# Patient Record
Sex: Female | Born: 1945 | Race: White | Hispanic: No | Marital: Married | Smoking: Never smoker
Health system: Southern US, Community
[De-identification: ages and names within clinical notes are randomized; demographics above are authoritative.]

## PROBLEM LIST (undated history)

## (undated) DIAGNOSIS — R011 Cardiac murmur, unspecified: Secondary | ICD-10-CM

## (undated) DIAGNOSIS — E785 Hyperlipidemia, unspecified: Secondary | ICD-10-CM

## (undated) DIAGNOSIS — I517 Cardiomegaly: Secondary | ICD-10-CM

## (undated) DIAGNOSIS — M8580 Other specified disorders of bone density and structure, unspecified site: Secondary | ICD-10-CM

## (undated) DIAGNOSIS — I10 Essential (primary) hypertension: Secondary | ICD-10-CM

## (undated) DIAGNOSIS — M810 Age-related osteoporosis without current pathological fracture: Secondary | ICD-10-CM

## (undated) HISTORY — PX: OTHER SURGICAL HISTORY: SHX169

## (undated) HISTORY — DX: Other specified disorders of bone density and structure, unspecified site: M85.80

## (undated) HISTORY — DX: Hyperlipidemia, unspecified: E78.5

## (undated) HISTORY — DX: Essential (primary) hypertension: I10

## (undated) HISTORY — DX: Cardiac murmur, unspecified: R01.1

## (undated) HISTORY — DX: Cardiomegaly: I51.7

## (undated) HISTORY — PX: AUGMENTATION MAMMAPLASTY: SUR837

## (undated) HISTORY — PX: COLONOSCOPY: SHX174

## (undated) HISTORY — DX: Age-related osteoporosis without current pathological fracture: M81.0

## (undated) HISTORY — PX: TUBAL LIGATION: SHX77

---

## 2008-04-28 ENCOUNTER — Encounter: Payer: Self-pay | Admitting: Internal Medicine

## 2008-04-28 LAB — CONVERTED CEMR LAB
Albumin: 4.2 g/dL
CO2: 24 meq/L
Chloride: 103 meq/L
Cholesterol: 203 mg/dL
Creatinine, Ser: 0.84 mg/dL
HDL: 54 mg/dL
Lymphocytes, automated: 47 %
MCV: 90 fL
Monocytes Relative: 8 %
RDW: 12.3 %
Sodium: 141 meq/L
Triglyceride fasting, serum: 75 mg/dL

## 2008-10-15 ENCOUNTER — Encounter: Admission: RE | Admit: 2008-10-15 | Discharge: 2008-10-15 | Payer: Self-pay | Admitting: Family Medicine

## 2008-11-10 ENCOUNTER — Encounter: Payer: Self-pay | Admitting: Internal Medicine

## 2008-11-10 LAB — CONVERTED CEMR LAB
Calcium: 9.9 mg/dL
Chloride: 105 meq/L
Eosinophils Relative: 0 %
Glucose, Bld: 101 mg/dL
Neutrophils Relative %: 57 %
Platelets: 280 10*3/uL
Potassium: 5.8 meq/L
RBC: 4.23 M/uL
RDW: 12.9 %
Sodium: 142 meq/L

## 2008-11-12 ENCOUNTER — Encounter: Payer: Self-pay | Admitting: Internal Medicine

## 2008-11-12 LAB — CONVERTED CEMR LAB
AST: 23 units/L
Albumin: 4.6 g/dL
Alkaline Phosphatase: 111 units/L
BUN: 21 mg/dL
Calcium: 9.7 mg/dL
Cholesterol: 196 mg/dL
Creatinine, Ser: 0.81 mg/dL
Glucose, Bld: 88 mg/dL
Sodium: 140 meq/L
Total Protein: 7.3 g/dL

## 2009-09-27 ENCOUNTER — Encounter: Payer: Self-pay | Admitting: Internal Medicine

## 2009-09-27 LAB — CONVERTED CEMR LAB

## 2010-07-16 ENCOUNTER — Emergency Department (HOSPITAL_COMMUNITY)
Admission: EM | Admit: 2010-07-16 | Discharge: 2010-07-16 | Payer: Self-pay | Source: Home / Self Care | Admitting: Emergency Medicine

## 2010-11-08 ENCOUNTER — Other Ambulatory Visit: Payer: Medicare Other

## 2010-11-08 ENCOUNTER — Other Ambulatory Visit: Payer: Self-pay | Admitting: Internal Medicine

## 2010-11-08 ENCOUNTER — Encounter: Payer: Self-pay | Admitting: Internal Medicine

## 2010-11-08 ENCOUNTER — Ambulatory Visit (INDEPENDENT_AMBULATORY_CARE_PROVIDER_SITE_OTHER): Payer: Medicare Other | Admitting: Internal Medicine

## 2010-11-08 ENCOUNTER — Ambulatory Visit
Admission: RE | Admit: 2010-11-08 | Discharge: 2010-11-08 | Disposition: A | Discharge status: Home / Self Care | Payer: Medicare Other | Source: Ambulatory Visit | Attending: Internal Medicine | Admitting: Internal Medicine

## 2010-11-08 DIAGNOSIS — Z0000 Encounter for general adult medical examination without abnormal findings: Secondary | ICD-10-CM

## 2010-11-08 DIAGNOSIS — Z23 Encounter for immunization: Secondary | ICD-10-CM

## 2010-11-08 DIAGNOSIS — R030 Elevated blood-pressure reading, without diagnosis of hypertension: Secondary | ICD-10-CM | POA: Insufficient documentation

## 2010-11-08 DIAGNOSIS — Z9189 Other specified personal risk factors, not elsewhere classified: Secondary | ICD-10-CM | POA: Insufficient documentation

## 2010-11-08 DIAGNOSIS — Z8679 Personal history of other diseases of the circulatory system: Secondary | ICD-10-CM | POA: Insufficient documentation

## 2010-11-08 DIAGNOSIS — E785 Hyperlipidemia, unspecified: Secondary | ICD-10-CM

## 2010-11-08 LAB — LDL CHOLESTEROL, DIRECT: Direct LDL: 163.1 mg/dL

## 2010-11-08 LAB — GLUCOSE, RANDOM: Glucose, Bld: 91 mg/dL (ref 70–99)

## 2010-11-08 LAB — LIPID PANEL
Total CHOL/HDL Ratio: 4
VLDL: 15.2 mg/dL (ref 0.0–40.0)

## 2010-11-09 ENCOUNTER — Other Ambulatory Visit: Payer: Medicare Other

## 2010-11-09 ENCOUNTER — Other Ambulatory Visit: Payer: Self-pay | Admitting: Internal Medicine

## 2010-11-09 ENCOUNTER — Encounter (INDEPENDENT_AMBULATORY_CARE_PROVIDER_SITE_OTHER): Payer: Self-pay | Admitting: *Deleted

## 2010-11-09 DIAGNOSIS — E785 Hyperlipidemia, unspecified: Secondary | ICD-10-CM | POA: Insufficient documentation

## 2010-11-09 DIAGNOSIS — M76899 Other specified enthesopathies of unspecified lower limb, excluding foot: Secondary | ICD-10-CM | POA: Insufficient documentation

## 2010-11-09 DIAGNOSIS — Z79899 Other long term (current) drug therapy: Secondary | ICD-10-CM

## 2010-11-09 LAB — HEPATIC FUNCTION PANEL: Total Bilirubin: 0.6 mg/dL (ref 0.3–1.2)

## 2010-11-16 ENCOUNTER — Encounter: Payer: Self-pay | Admitting: Internal Medicine

## 2010-11-17 ENCOUNTER — Encounter: Payer: Self-pay | Admitting: Internal Medicine

## 2010-11-17 NOTE — Assessment & Plan Note (Signed)
Summary: NEW/MEDICARE/#/CD   Vital Signs:  Patient profile:   65 year old female Height:      70 inches (177.80 cm) Weight:      194.0 pounds (88.18 kg) BMI:     27.94 O2 Sat:      96 % on Room air Temp:     98.6 degrees F (37.00 degrees C) oral Pulse rate:   65 / minute BP sitting:   130 / 92  (left arm) Cuff size:   large  Vitals Entered By: Orlan Leavens RMA (November 08, 2010 9:50 AM)  O2 Flow:  Room air CC: New patient Is Patient Diabetic? No Pain Assessment Patient in pain? no       Vision Screening:Left eye w/o correction: 20 / 25 Right Eye w/o correction: 20 / 30 Both eyes w/o correction:  20/ 25  Color vision testing: normal      Vision Entered By: Newt Lukes MD (November 08, 2010 2:15 PM) 40db HL: Left  Right  Audiometry Comment: hearing normal to whispered voice    Primary Care Provider:  Newt Lukes MD  CC:  New patient.  History of Present Illness: Patient is here for "welcome to medicare" evaluation. feels well today -    notes tendency towards high BP but not on med tx for same  also noted pain in right "hip" buttock -  intermitt symptoms - present >97mo pain worst with 1st few steps after sitting, then fades with continued walking 0 no groin or leg pain, no radiation or pain denies precipitating injury  Preventive Screening-Counseling & Management  Alcohol-Tobacco     Alcohol drinks/day: 3     Alcohol Counseling: to decrease amount and/or frequency of alcohol intake     Smoking Status: never     Tobacco Counseling: not indicated; no tobacco use  Caffeine-Diet-Exercise     Caffeine Counseling: not indicated; caffeine use is not excessive or problematic     Diet Counseling: to improve diet; diet is suboptimal     Does Patient Exercise: no     Exercise Counseling: to improve exercise regimen     Depression Counseling: not indicated; screening negative for depression  Safety-Violence-Falls     Seat Belt Counseling: not  indicated; patient wears seat belts     Helmet Counseling: not indicated; patient wears helmet when riding bicycle/motocycle     Firearm Counseling: not indicated; uses recommended firearm safety measures     Violence Counseling: not indicated; no violence risk noted     Fall Risk Counseling: not indicated; no significant falls noted  Clinical Review Panels:  Prevention   Last Mammogram:  No specific mammographic evidence of malignancy.   (09/27/2007)   Last Pap Smear:  Interpretation Result:Negative for intraepithelial Lesion or Malignancy.    (09/27/2009)  Immunizations   Last Tetanus Booster:  Tdap (11/08/2010)   Last Pneumovax:  Pneumovax (Medicare) (11/08/2010)   Current Medications (verified): 1)  None  Allergies (verified): 1)  ! 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 I have reviewed all other systems and they were negative.   Physical Exam  General:  overweight-appearing.  alert, well-developed, well-nourished, and cooperative to examination.    Head:  Normocephalic and atraumatic without obvious abnormalities. No apparent alopecia or balding. Eyes:  vision grossly intact; pupils equal, round and reactive to light.  conjunctiva and lids normal.    Ears:  normal pinnae bilaterally, without erythema, swelling, or tenderness to palpation. TMs clear, without effusion, or cerumen impaction. Hearing grossly normal bilaterally  Mouth:  teeth and gums in good repair;  mucous membranes moist, without lesions or ulcers. oropharynx clear without exudate, no erythema.  Neck:  supple, full ROM, no masses, no thyromegaly; no thyroid nodules or tenderness. no JVD or carotid bruits.   Lungs:  normal respiratory effort, no intercostal retractions or use of accessory muscles; normal breath sounds bilaterally - no crackles and no wheezes.    Heart:  normal rate, regular rhythm, no murmur, and no rub. BLE without edema. normal DP pulses and normal cap refill in all 4 extremities    Abdomen:  soft, non-tender, normal bowel sounds, no distention; no masses and no appreciable hepatomegaly or splenomegaly.   Genitalia:  defer gyn Msk:  r hip: full range of motion. Negative pain with logroll. No deep groin pain. nontender over greater trochanter.  no gross deformities Neurologic:  alert & oriented X3 and cranial nerves II-XII symetrically intact.  strength normal in all extremities, sensation intact to light touch, and gait normal. speech fluent without dysarthria or aphasia; follows commands with good comprehension.  Skin:  no rashes, vesicles, ulcers, or erythema. No nodules or irregularity to palpation.  Psych:  tearful when discussing mother (and death from panc ca 51yr ago) - otherwise, Oriented X3, memory intact for recent and remote, normally interactive, good eye contact, not anxious appearing, not depressed appearing, and not agitated.      Impression & Recommendations:  Problem # 1:  PREVENTIVE HEALTH CARE (ICD-V70.0) Patient is here for "welcome to medicare" evaluation. Risk factors for depression per USPSTF are reviewed and negative. Patient hearing function is screened today; ADLs are reviewed and addressed as needed; functional ability and  level of safety have been reviewed and are appropriate. ECG is performed and reviewed. Education, counseling, and referrals are performed based on age appropriate health issues. Patient has information regarding separate  preventitive health services covered by medicare part b  Orders: T-Bone Densitometry [phone]) T-Lumbar Vertebral Assessment (14782) Misc. Referral (Misc. Ref) TLB-Lipid Panel (80061-LIPID) TLB-Glucose, QUANT (82947-GLU) Welcome to Medicare, Physical [phone]) EKG w/ Interpretation (93000)  Problem # 2:  BURSITIS, RIGHT HIP (ICD-726.5) pain from bursitis, mild - refer to PT for eval and tx Orders: Physical Therapy Referral (PT)  Problem # 3:  HYPERTENSION (ICD-401.9) borderline - check lipids but no starting meds yet - rec diet, low Na and exercise - reck 3 mo  BP today: 130/92  Other Orders: Tdap => 66yrs IM (30865) Pneumococcal Vaccine (78469) Admin 1st Vaccine (62952) Admin of Any Addtl Vaccine (84132)  Patient Instructions: 1)  it was good to see you today. 2)  welcome to medicare checklist given to you today 3)  Tdap and Pneumovax given today 4)  test(s) ordered today - your results will be posted on the phone tree for review in 48-72 hours from the time of test completion; call [phone] and enter your 9 digit MRN (listed above on this page, just below your name); if any changes need to be  made or there are abnormal results, you will be contacted directly. 5)  we'll make referral for bone density and for mammogram screening. Our office will contact you regarding these appointments once made.  6)  also we'll make referral to physical therapy for your right hip/butt pain. Our office will contact you regarding this appointment once made.  7)  Please schedule a follow-up appointment in 3 months to recheck blood pressure, call sooner if problems.  8)  will send for records from [facility] and for colonoscopy report from [facility]   Orders Added: 1)  T-Bone Densitometry [77080] 2)  T-Lumbar Vertebral Assessment [77082] 3)  Tdap => 63yrs IM [90715] 4)  Pneumococcal Vaccine [90732] 5)  Admin 1st Vaccine [90471] 6)  Admin of Any Addtl Vaccine [90472] 7)  Misc. Referral [Misc.  Ref] 8)  TLB-Lipid Panel [80061-LIPID] 9)  TLB-Glucose, QUANT [82947-GLU] 10)  Welcome to Medicare, Physical [G0402] 11)  EKG w/ Interpretation [93000] 12)  Physical Therapy Referral [PT]   Immunizations Administered:  Tetanus Vaccine:    Vaccine Type: Tdap    Site: right deltoid    Mfr: GlaxoSmithKline    Dose: 0.5 ml    Route: IM    Given by: Orlan Leavens RMA    Exp. Date: 07/24/2011    Lot #: [id number]    VIS given: 11/08/10  Pneumonia Vaccine:    Vaccine Type: Pneumovax (Medicare)    Site: left deltoid    Mfr: Merck    Dose: 0.5 ml    Route: IM    Given by: Orlan Leavens RMA    Exp. Date: 02/18/2012    Lot #: [id number]    VIS given: 11/08/10   Immunizations Administered:  Tetanus Vaccine:    Vaccine Type: Tdap    Site: right deltoid    Mfr: GlaxoSmithKline    Dose: 0.5 ml    Route: IM    Given by: Orlan Leavens RMA    Exp. Date: 07/24/2011    Lot #: [id number]    VIS given: 11/08/10  Pneumonia Vaccine:    Vaccine Type: Pneumovax (Medicare)    Site: left deltoid    Mfr: Merck    Dose: 0.5 ml    Route: IM    Given by: Orlan Leavens RMA    Exp. Date: 02/18/2012    Lot #: [id number]    VIS given: 11/08/10   Pap Smear  Procedure date:  09/27/2009  Findings:      Interpretation Result:Negative for intraepithelial Lesion or Malignancy.     Mammogram  Procedure date:  09/27/2007  Findings:      No specific mammographic evidence of malignancy.

## 2010-11-17 NOTE — Letter (Signed)
Summary: Medicare Encounter Form  Medicare Encounter Form   Imported By: Lester Marshall 11/10/2010 10:04:51  _____________________________________________________________________  External Attachment:    Type:   Image     Comment:   External Document

## 2010-11-18 DIAGNOSIS — M810 Age-related osteoporosis without current pathological fracture: Secondary | ICD-10-CM | POA: Insufficient documentation

## 2010-11-18 DIAGNOSIS — M8580 Other specified disorders of bone density and structure, unspecified site: Secondary | ICD-10-CM | POA: Insufficient documentation

## 2010-11-19 ENCOUNTER — Encounter: Payer: Self-pay | Admitting: Internal Medicine

## 2010-11-22 ENCOUNTER — Encounter: Payer: Self-pay | Admitting: Internal Medicine

## 2010-12-06 ENCOUNTER — Other Ambulatory Visit: Payer: Self-pay | Admitting: Internal Medicine

## 2010-12-06 ENCOUNTER — Other Ambulatory Visit: Payer: Self-pay | Admitting: *Deleted

## 2010-12-06 ENCOUNTER — Other Ambulatory Visit: Payer: Self-pay | Admitting: Obstetrics and Gynecology

## 2010-12-06 DIAGNOSIS — R922 Inconclusive mammogram: Secondary | ICD-10-CM

## 2010-12-10 ENCOUNTER — Other Ambulatory Visit: Payer: Self-pay | Admitting: Internal Medicine

## 2010-12-10 ENCOUNTER — Ambulatory Visit
Admission: RE | Admit: 2010-12-10 | Discharge: 2010-12-10 | Disposition: A | Discharge status: Home / Self Care | Payer: Medicare Other | Source: Ambulatory Visit | Attending: Obstetrics and Gynecology | Admitting: Obstetrics and Gynecology

## 2010-12-10 DIAGNOSIS — R922 Inconclusive mammogram: Secondary | ICD-10-CM

## 2010-12-15 ENCOUNTER — Ambulatory Visit
Admission: RE | Admit: 2010-12-15 | Discharge: 2010-12-15 | Disposition: A | Discharge status: Home / Self Care | Payer: Medicare Other | Source: Ambulatory Visit | Attending: Internal Medicine | Admitting: Internal Medicine

## 2010-12-15 DIAGNOSIS — R922 Inconclusive mammogram: Secondary | ICD-10-CM

## 2010-12-23 ENCOUNTER — Other Ambulatory Visit: Payer: Self-pay | Admitting: Internal Medicine

## 2010-12-23 MED ORDER — ATORVASTATIN CALCIUM 10 MG PO TABS: 10.0000 mg | ORAL_TABLET | Freq: Every day | ORAL | Status: DC

## 2010-12-23 NOTE — Telephone Encounter (Signed)
Pt's spouse called regarding rx for Lipitor. Pt would like rx for Lipitor to be sent to [facility]. Per spouse, [facility] has been trying to contact the office since January regarding rx.

## 2011-02-07 ENCOUNTER — Ambulatory Visit: Payer: Medicare Other | Admitting: Internal Medicine

## 2011-02-09 ENCOUNTER — Ambulatory Visit: Payer: Medicare Other | Admitting: Internal Medicine

## 2011-02-14 ENCOUNTER — Encounter: Payer: Self-pay | Admitting: Internal Medicine

## 2011-02-14 ENCOUNTER — Ambulatory Visit (INDEPENDENT_AMBULATORY_CARE_PROVIDER_SITE_OTHER): Payer: Medicare Other | Admitting: Internal Medicine

## 2011-02-14 DIAGNOSIS — Z79899 Other long term (current) drug therapy: Secondary | ICD-10-CM

## 2011-02-14 DIAGNOSIS — E785 Hyperlipidemia, unspecified: Secondary | ICD-10-CM

## 2011-02-14 DIAGNOSIS — I10 Essential (primary) hypertension: Secondary | ICD-10-CM

## 2011-02-14 MED ORDER — LOSARTAN POTASSIUM 25 MG PO TABS: 25.0000 mg | ORAL_TABLET | Freq: Every day | ORAL | Status: DC

## 2011-02-14 NOTE — Assessment & Plan Note (Signed)
Started lipitor for same 10/2010 - need to recheck flp and lfts now - adjust as/if needed

## 2011-02-14 NOTE — Progress Notes (Signed)
  Subjective:    Patient ID: Elizabeth Wilkins, female    DOB: 1946/02/04, 65 y.o.   MRN: [id number]  HPI  Here for follow up   Dyslipidemia - dx 10/2010 and started low dose lipitor for same - the patient reports compliance with medication(s) as prescribed. Denies adverse side effects.  Elevated BP/HTN - remotely on rx tx but became BP too low on tx - unsure of tx -   Past Medical History  Diagnosis Date  . Hypertension   . Hyperlipidemia      Review of Systems  Respiratory: Negative for cough and shortness of breath.   Cardiovascular: Negative for chest pain.  Neurological: Negative for headaches.       Objective:   Physical Exam BP 132/84  Pulse 64  Temp(Src) 98.3 F (36.8 C) (Oral)  Ht 5\' 10"  (1.778 m)  Wt 192 lb 3.2 oz (87.181 kg)  BMI 27.58 kg/m2  SpO2 96% Physical Exam  Constitutional: She is oriented to person, place, and time. She appears well-developed and well-nourished. No distress. Cardiovascular: Normal rate, regular rhythm and normal heart sounds.  No murmur heard. No BLE edema. Pulmonary/Chest: Effort normal and breath sounds normal. No respiratory distress. She has no wheezes.  Psychiatric: She has a normal mood and affect. Her behavior is normal. Judgment and thought content normal.   Lab Results  Component Value Date   WBC 4.1 11/10/2008   HGB 13.7 11/10/2008   HCT 39.5 11/10/2008   PLT 280 11/10/2008   CHOL 225* 11/08/2010   TRIG 76.0 11/08/2010   HDL 54.30 11/08/2010   LDLDIRECT 163.1 11/08/2010   ALT 23 11/09/2010   AST 22 11/09/2010   NA 140 11/12/2008   K 5.2 11/12/2008   CL 103 11/12/2008   CREATININE 0.81 11/12/2008   BUN 21 11/12/2008   CO2 23 11/12/2008   TSH 3.670 04/28/2008        Assessment & Plan:  See problem list. Medications and labs reviewed today.

## 2011-02-14 NOTE — Assessment & Plan Note (Signed)
Reports optho also saw retinal changes at last visit ([facility]) related to BP Will start low dose ARB at this time - advised on possible risk/benefits and pt agrees - erx done To call if problems before nect OV BP Readings from Last 3 Encounters:  02/14/11 132/84  11/08/10 130/92

## 2011-02-14 NOTE — Patient Instructions (Signed)
It was good to see you today. Test(s) ordered today, return when fasting in next 2 weeks. Your results will be called to you after review (48-72hours after test completion). If any changes need to be made, you will be notified at that time. Start losartan for blood pressure today -= Your prescription(s) have been submitted to your pharmacy. Please take as directed and contact our office if you believe you are having problem(s) with the medication(s). Please schedule followup in 3 months to review blood pressure and medications, call sooner if problems.

## 2011-03-08 ENCOUNTER — Other Ambulatory Visit (INDEPENDENT_AMBULATORY_CARE_PROVIDER_SITE_OTHER): Payer: Medicare Other

## 2011-03-08 DIAGNOSIS — Z79899 Other long term (current) drug therapy: Secondary | ICD-10-CM

## 2011-03-08 DIAGNOSIS — E785 Hyperlipidemia, unspecified: Secondary | ICD-10-CM

## 2011-03-08 LAB — LIPID PANEL
Cholesterol: 168 mg/dL (ref 0–200)
HDL: 49.7 mg/dL (ref >39.00)
Total CHOL/HDL Ratio: 3
Triglycerides: 91 mg/dL (ref 0.0–149.0)

## 2011-03-08 LAB — HEPATIC FUNCTION PANEL
ALT: 26 U/L (ref 0–35)
AST: 22 U/L (ref 0–37)
Albumin: 4.4 g/dL (ref 3.5–5.2)
Alkaline Phosphatase: 81 U/L (ref 39–117)

## 2011-04-09 ENCOUNTER — Emergency Department (HOSPITAL_COMMUNITY)
Admission: EM | Admit: 2011-04-09 | Discharge: 2011-04-09 | Disposition: A | Discharge status: Home / Self Care | Payer: Medicare Other | Attending: Emergency Medicine | Admitting: Emergency Medicine

## 2011-04-09 DIAGNOSIS — H5789 Other specified disorders of eye and adnexa: Secondary | ICD-10-CM | POA: Insufficient documentation

## 2011-04-09 DIAGNOSIS — H538 Other visual disturbances: Secondary | ICD-10-CM | POA: Insufficient documentation

## 2011-04-09 DIAGNOSIS — Z79899 Other long term (current) drug therapy: Secondary | ICD-10-CM | POA: Insufficient documentation

## 2011-04-09 DIAGNOSIS — H11429 Conjunctival edema, unspecified eye: Secondary | ICD-10-CM | POA: Insufficient documentation

## 2011-04-09 DIAGNOSIS — I10 Essential (primary) hypertension: Secondary | ICD-10-CM | POA: Insufficient documentation

## 2011-04-09 DIAGNOSIS — H109 Unspecified conjunctivitis: Secondary | ICD-10-CM | POA: Insufficient documentation

## 2011-04-09 DIAGNOSIS — L2989 Other pruritus: Secondary | ICD-10-CM | POA: Insufficient documentation

## 2011-04-09 DIAGNOSIS — L298 Other pruritus: Secondary | ICD-10-CM | POA: Insufficient documentation

## 2011-04-09 DIAGNOSIS — H11419 Vascular abnormalities of conjunctiva, unspecified eye: Secondary | ICD-10-CM | POA: Insufficient documentation

## 2011-04-09 DIAGNOSIS — E7800 Pure hypercholesterolemia, unspecified: Secondary | ICD-10-CM | POA: Insufficient documentation

## 2011-04-13 ENCOUNTER — Ambulatory Visit: Payer: Medicare Other | Admitting: Internal Medicine

## 2011-04-21 ENCOUNTER — Encounter: Payer: Self-pay | Admitting: Internal Medicine

## 2011-04-21 ENCOUNTER — Ambulatory Visit (INDEPENDENT_AMBULATORY_CARE_PROVIDER_SITE_OTHER): Payer: Medicare Other | Admitting: Internal Medicine

## 2011-04-21 DIAGNOSIS — M949 Disorder of cartilage, unspecified: Secondary | ICD-10-CM

## 2011-04-21 DIAGNOSIS — I10 Essential (primary) hypertension: Secondary | ICD-10-CM

## 2011-04-21 DIAGNOSIS — M6580 Other synovitis and tenosynovitis, unspecified site: Secondary | ICD-10-CM

## 2011-04-21 DIAGNOSIS — M899 Disorder of bone, unspecified: Secondary | ICD-10-CM

## 2011-04-21 DIAGNOSIS — M76892 Other specified enthesopathies of left lower limb, excluding foot: Secondary | ICD-10-CM

## 2011-04-21 DIAGNOSIS — M8580 Other specified disorders of bone density and structure, unspecified site: Secondary | ICD-10-CM

## 2011-04-21 MED ORDER — DICLOFENAC SODIUM 75 MG PO TBEC: 75.0000 mg | DELAYED_RELEASE_TABLET | Freq: Two times a day (BID) | ORAL | Status: DC

## 2011-04-21 MED ORDER — LOSARTAN POTASSIUM 50 MG PO TABS: 50.0000 mg | ORAL_TABLET | Freq: Every day | ORAL | Status: DC

## 2011-04-21 MED ORDER — ALENDRONATE SODIUM 70 MG PO TABS: 70.0000 mg | ORAL_TABLET | ORAL | Status: DC

## 2011-04-21 NOTE — Assessment & Plan Note (Signed)
Reports optho also saw retinal changes at last visit ([facility]) related to BP started low dose ARB 01/2011 - titrate up at this time To call if problems before nect OV BP Readings from Last 3 Encounters:  04/21/11 136/92  02/14/11 132/84  11/08/10 130/92

## 2011-04-21 NOTE — Progress Notes (Signed)
  Subjective:    Patient ID: Elizabeth Wilkins, female    DOB: 14-Dec-1945, 65 y.o.   MRN: [id number]  HPI   Here for osteopenia - ? Tx, on Ca+vit d - "stress bone on xray at ortho - denies fx (no report)  Also reviewed med issues:  Dyslipidemia - dx 10/2010 and started low dose lipitor for same - the patient reports compliance with medication(s) as prescribed. Denies adverse side effects.  HTN- started ARB for same 01/2011 - exac by ortho pain  Past Medical History  Diagnosis Date  . Hypertension   . Hyperlipidemia     Review of Systems  Respiratory: Negative for cough and shortness of breath.   Cardiovascular: Negative for chest pain.  Neurological: Negative for headaches.       Objective:   Physical Exam  BP 136/92  Pulse 62  Temp(Src) 98.2 F (36.8 C) (Oral)  Ht 5\' 10"  (1.778 m)  Wt 188 lb (85.276 kg)  BMI 26.98 kg/m2  SpO2 95% Physical Exam  Constitutional: She is oriented to person, place, and time. She appears well-developed and well-nourished. No distress. Cardiovascular: Normal rate, regular rhythm and normal heart sounds.  No murmur heard. No BLE edema. Pulmonary/Chest: Effort normal and breath sounds normal. No respiratory distress. She has no wheezes.  Mskel - no swelling L knee or R ankle - milld tenderness to palp over fem-pat ligament Psychiatric: She has a normal mood and affect. Her behavior is normal. Judgment and thought content normal.   Lab Results  Component Value Date   WBC 4.1 11/10/2008   HGB 13.7 11/10/2008   HCT 39.5 11/10/2008   PLT 280 11/10/2008   CHOL 168 03/08/2011   TRIG 91.0 03/08/2011   HDL 49.70 03/08/2011   LDLDIRECT 163.1 11/08/2010   ALT 26 03/08/2011   AST 22 03/08/2011   NA 140 11/12/2008   K 5.2 11/12/2008   CL 103 11/12/2008   CREATININE 0.81 11/12/2008   BUN 21 11/12/2008   CO2 23 11/12/2008   TSH 3.670 04/28/2008        Assessment & Plan:  See problem list. Medications and labs reviewed today.  L knee tendonitis with recent  "twist" injury - saw ortho for same - tx with NSAIDs x 2 weeks at this time given strain on R ankle and R hip from L knee injury

## 2011-04-21 NOTE — Assessment & Plan Note (Signed)
Reports "stress bone reaction" L knee on recent ortho xray (no report available to me at this time) Will start Fosamax - consider Reclast if unimporved f/u dexa 10/2011  Continue Vit D and calcium

## 2011-04-21 NOTE — Patient Instructions (Addendum)
It was good to see you today. We will start Fosamax for your osteopenia given "stress bone reaction" in knee on Dr. France Ravens xray - continue Calcium and Vit D Also use Voltaren 2x/day x 10days for inflammation/pain Increase Losartan for better blood pressure control Your prescription(s) have been submitted to your pharmacy. Please take as directed and contact our office if you believe you are having problem(s) with the medication(s). Keep August visit to recheck blood pressure, call sooner if problems

## 2011-05-16 ENCOUNTER — Ambulatory Visit (INDEPENDENT_AMBULATORY_CARE_PROVIDER_SITE_OTHER): Payer: Medicare Other | Admitting: Internal Medicine

## 2011-05-16 ENCOUNTER — Encounter: Payer: Self-pay | Admitting: Internal Medicine

## 2011-05-16 VITALS — BP 162/100 | HR 63 | Temp 98.8°F | Ht 70.0 in | Wt 188.6 lb

## 2011-05-16 DIAGNOSIS — I10 Essential (primary) hypertension: Secondary | ICD-10-CM

## 2011-05-16 DIAGNOSIS — E785 Hyperlipidemia, unspecified: Secondary | ICD-10-CM

## 2011-05-16 MED ORDER — LOSARTAN POTASSIUM-HCTZ 100-25 MG PO TABS: 1.0000 | ORAL_TABLET | Freq: Every day | ORAL | Status: DC

## 2011-05-16 NOTE — Progress Notes (Signed)
  Subjective:    Patient ID: Elizabeth Wilkins, female    DOB: 24-Apr-1946, 65 y.o.   MRN: [id number]  HPI Here for follow up - reviewed chronic medical issues:  Dyslipidemia - dx 10/2010 and started low dose lipitor- the patient reports compliance with medication(s) as prescribed. Denies adverse side effects.  HTN - started ARB 01/2011 - titrated up 03/2011 - no chest pain, headache or edema - the patient reports compliance with medication(s) as prescribed. Denies adverse side effects.  Osteopenia - started fosamax 03/2011 after dx "stress injury" by ortho summer 2012 - due for dexa 10/2011   Past Medical History  Diagnosis Date  . Hypertension   . Hyperlipidemia   . Osteopenia     Review of Systems  Respiratory: Negative for cough and shortness of breath.   Cardiovascular: Negative for chest pain.  Neurological: Negative for headaches.       Objective:   Physical Exam  BP 162/100  Pulse 63  Temp(Src) 98.8 F (37.1 C) (Oral)  Ht 5\' 10"  (1.778 m)  Wt 188 lb 9.6 oz (85.548 kg)  BMI 27.06 kg/m2  SpO2 97%  Constitutional: She is oriented to person, place, and time. She appears well-developed and well-nourished. No distress. Cardiovascular: Normal rate, regular rhythm and normal heart sounds.  No murmur heard. No BLE edema. Pulmonary/Chest: Effort normal and breath sounds normal. No respiratory distress. She has no wheezes.  Psychiatric: She has a normal mood and affect. Her behavior is normal. Judgment and thought content normal.   Lab Results  Component Value Date   WBC 4.1 11/10/2008   HGB 13.7 11/10/2008   HCT 39.5 11/10/2008   PLT 280 11/10/2008   CHOL 168 03/08/2011   TRIG 91.0 03/08/2011   HDL 49.70 03/08/2011   LDLDIRECT 163.1 11/08/2010   ALT 26 03/08/2011   AST 22 03/08/2011   NA 140 11/12/2008   K 5.2 11/12/2008   CL 103 11/12/2008   CREATININE 0.81 11/12/2008   BUN 21 11/12/2008   CO2 23 11/12/2008   TSH 3.670 04/28/2008        Assessment & Plan:  See problem list.  Medications and labs reviewed today.  Reports colo done [facility] approx 2005 - offered to send ROI but pt prefers to call herself  Also R ankle pain - swelling on lateral side of R achilles, no injury recalled - not improved with voltaren pills - pt prefers to call her ortho for eval of same - will call here if problems woith pain or getting appointment

## 2011-05-16 NOTE — Assessment & Plan Note (Addendum)
Reports optho saw retinal changes 01/2011 ([facility]) related to BP started low dose ARB 01/2011 - titrate up 03/2011 Continue titration to max ARB and add hctz To call if problems before next OV BP Readings from Last 3 Encounters:  05/16/11 162/100  04/21/11 136/92  02/14/11 132/84

## 2011-05-16 NOTE — Assessment & Plan Note (Signed)
Started lipitor for same 10/2010 - recheck flp and lfts 02/2011-

## 2011-05-16 NOTE — Patient Instructions (Signed)
It was good to see you today. Change blood pressure medication to losartan with hctz (diuretic) - Your prescription(s) have been submitted to your pharmacy. Please take as directed and contact our office if you believe you are having problem(s) with the medication(s). Please schedule followup in 3 months for blood pressure check, call sooner if problems. Let us know if you need help with finding colonoscopy information or getting in with Dr. Thomasena Edis

## 2011-05-17 ENCOUNTER — Other Ambulatory Visit: Payer: Self-pay

## 2011-05-17 DIAGNOSIS — M8580 Other specified disorders of bone density and structure, unspecified site: Secondary | ICD-10-CM

## 2011-05-17 DIAGNOSIS — I10 Essential (primary) hypertension: Secondary | ICD-10-CM

## 2011-05-17 DIAGNOSIS — M76892 Other specified enthesopathies of left lower limb, excluding foot: Secondary | ICD-10-CM

## 2011-05-17 MED ORDER — LOSARTAN POTASSIUM-HCTZ 100-25 MG PO TABS: 1.0000 | ORAL_TABLET | Freq: Every day | ORAL | Status: DC

## 2011-05-17 MED ORDER — ALENDRONATE SODIUM 70 MG PO TABS: 70.0000 mg | ORAL_TABLET | ORAL | Status: DC

## 2011-06-14 ENCOUNTER — Telehealth: Payer: Self-pay | Admitting: *Deleted

## 2011-06-14 DIAGNOSIS — I10 Essential (primary) hypertension: Secondary | ICD-10-CM

## 2011-06-14 MED ORDER — LOSARTAN POTASSIUM-HCTZ 100-25 MG PO TABS: 1.0000 | ORAL_TABLET | Freq: Every day | ORAL | Status: DC

## 2011-06-14 NOTE — Telephone Encounter (Signed)
Pt call needing BP med sent to [facility]. Sending Losartan/HCTZ

## 2011-08-17 ENCOUNTER — Ambulatory Visit: Payer: Medicare Other | Admitting: Internal Medicine

## 2011-08-17 DIAGNOSIS — Z0289 Encounter for other administrative examinations: Secondary | ICD-10-CM

## 2011-10-03 ENCOUNTER — Ambulatory Visit (INDEPENDENT_AMBULATORY_CARE_PROVIDER_SITE_OTHER): Payer: Medicare Other | Admitting: Internal Medicine

## 2011-10-03 ENCOUNTER — Encounter: Payer: Self-pay | Admitting: Internal Medicine

## 2011-10-03 VITALS — BP 122/82 | HR 73 | Temp 98.0°F | Wt 186.1 lb

## 2011-10-03 DIAGNOSIS — E785 Hyperlipidemia, unspecified: Secondary | ICD-10-CM | POA: Diagnosis not present

## 2011-10-03 DIAGNOSIS — Z23 Encounter for immunization: Secondary | ICD-10-CM

## 2011-10-03 DIAGNOSIS — Z1239 Encounter for other screening for malignant neoplasm of breast: Secondary | ICD-10-CM | POA: Diagnosis not present

## 2011-10-03 DIAGNOSIS — I10 Essential (primary) hypertension: Secondary | ICD-10-CM | POA: Diagnosis not present

## 2011-10-03 DIAGNOSIS — M899 Disorder of bone, unspecified: Secondary | ICD-10-CM | POA: Diagnosis not present

## 2011-10-03 DIAGNOSIS — M949 Disorder of cartilage, unspecified: Secondary | ICD-10-CM | POA: Diagnosis not present

## 2011-10-03 NOTE — Assessment & Plan Note (Signed)
Started lipitor for same 10/2010 - Much improved - recheck today

## 2011-10-03 NOTE — Assessment & Plan Note (Signed)
Reports optho saw retinal changes 01/2011 ([facility]) related to BP started low dose ARB 01/2011 - titrated up 03/2011 Maxed ARB and added hctz 04/2011  BP Readings from Last 3 Encounters:  10/03/11 122/82  05/16/11 162/100  04/21/11 136/92

## 2011-10-03 NOTE — Patient Instructions (Signed)
It was good to see you today. Medications reviewed, no changes at this time. Test(s) ordered today. Return when you're fasting for these tests .Your results will be called to you after review (48-72hours after test completion). If any changes need to be made, you will be notified at that time. we'll make referral to [facility] for 10/2011 mammogram and bone density testing. Our office will contact you regarding appointment(s) once made. Flu shot done today Please schedule followup in 6 months for blood pressure check, call sooner if problems. We will send a release of information from [facility] regarding prior colonoscopy report

## 2011-10-03 NOTE — Assessment & Plan Note (Signed)
Reports "stress bone reaction" L knee on recent ortho xray (no report available to me at this time) trial Fosamax 03/2011 - self dc'd due to shin pain -  Will consider Reclast or Prolia if unimporved f/u dexa 10/2011  Encouraged to continue Vit D and calcium

## 2011-10-03 NOTE — Progress Notes (Signed)
  Subjective:    Patient ID: Elizabeth Wilkins, female    DOB: Aug 03, 1946, 66 y.o.   MRN: [id number]  HPI Here for follow up - reviewed chronic medical issues:  Dyslipidemia - dx 10/2010 and started low dose lipitor- the patient reports compliance with medication(s) as prescribed. Denies adverse side effects.  HTN - started ARB 01/2011 - titrated up 03/2011, maxed dose 04/2011 and added HCTZ - no chest pain, headache or edema - the patient reports compliance with medication(s) as prescribed. Denies adverse side effects.  Osteopenia - started fosamax 03/2011 after dx "stress injury" by ortho summer 2012, but stopped same due to bone shin pain - due for dexa 10/2011   Past Medical History  Diagnosis Date  . Hypertension   . Hyperlipidemia   . Osteopenia     Review of Systems  Respiratory: Negative for cough and shortness of breath.   Cardiovascular: Negative for chest pain.  Neurological: Negative for headaches.       Objective:   Physical Exam  BP 122/82  Pulse 73  Temp(Src) 98 F (36.7 C) (Oral)  Wt 186 lb 1.9 oz (84.423 kg)  SpO2 94%  Constitutional: She appears well-developed and well-nourished. No distress. Cardiovascular: Normal rate, regular rhythm and normal heart sounds.  No murmur heard. No BLE edema. Pulmonary/Chest: Effort normal and breath sounds normal. No respiratory distress. She has no wheezes.  Psychiatric: She has a normal mood and affect. Her behavior is normal. Judgment and thought content normal.   Lab Results  Component Value Date   WBC 4.1 11/10/2008   HGB 13.7 11/10/2008   HCT 39.5 11/10/2008   PLT 280 11/10/2008   CHOL 168 03/08/2011   TRIG 91.0 03/08/2011   HDL 49.70 03/08/2011   LDLDIRECT 163.1 11/08/2010   ALT 26 03/08/2011   AST 22 03/08/2011   NA 140 11/12/2008   K 5.2 11/12/2008   CL 103 11/12/2008   CREATININE 0.81 11/12/2008   BUN 21 11/12/2008   CO2 23 11/12/2008   TSH 3.670 04/28/2008        Assessment & Plan:  See problem list. Medications and  labs reviewed today.

## 2011-10-05 ENCOUNTER — Other Ambulatory Visit (INDEPENDENT_AMBULATORY_CARE_PROVIDER_SITE_OTHER): Payer: Medicare Other

## 2011-10-05 DIAGNOSIS — E785 Hyperlipidemia, unspecified: Secondary | ICD-10-CM

## 2011-10-05 LAB — LIPID PANEL
Cholesterol: 176 mg/dL (ref 0–200)
LDL Cholesterol: 101 mg/dL — ABNORMAL HIGH (ref 0–99)
Total CHOL/HDL Ratio: 3
Triglycerides: 89 mg/dL (ref 0.0–149.0)

## 2011-10-20 DIAGNOSIS — L408 Other psoriasis: Secondary | ICD-10-CM | POA: Diagnosis not present

## 2011-10-20 DIAGNOSIS — L708 Other acne: Secondary | ICD-10-CM | POA: Diagnosis not present

## 2011-11-24 ENCOUNTER — Ambulatory Visit
Admission: RE | Admit: 2011-11-24 | Discharge: 2011-11-24 | Disposition: A | Discharge status: Home / Self Care | Payer: Medicare Other | Source: Ambulatory Visit | Attending: Internal Medicine | Admitting: Internal Medicine

## 2011-11-24 DIAGNOSIS — Z1231 Encounter for screening mammogram for malignant neoplasm of breast: Secondary | ICD-10-CM | POA: Diagnosis not present

## 2011-11-24 DIAGNOSIS — M899 Disorder of bone, unspecified: Secondary | ICD-10-CM

## 2011-11-25 ENCOUNTER — Encounter: Payer: Self-pay | Admitting: Internal Medicine

## 2011-12-22 DIAGNOSIS — H35039 Hypertensive retinopathy, unspecified eye: Secondary | ICD-10-CM | POA: Diagnosis not present

## 2011-12-22 DIAGNOSIS — H2510 Age-related nuclear cataract, unspecified eye: Secondary | ICD-10-CM | POA: Diagnosis not present

## 2011-12-22 DIAGNOSIS — H04129 Dry eye syndrome of unspecified lacrimal gland: Secondary | ICD-10-CM | POA: Diagnosis not present

## 2011-12-22 DIAGNOSIS — H40019 Open angle with borderline findings, low risk, unspecified eye: Secondary | ICD-10-CM | POA: Diagnosis not present

## 2011-12-22 DIAGNOSIS — H35379 Puckering of macula, unspecified eye: Secondary | ICD-10-CM | POA: Diagnosis not present

## 2012-02-07 ENCOUNTER — Other Ambulatory Visit: Payer: Self-pay

## 2012-02-07 DIAGNOSIS — B009 Herpesviral infection, unspecified: Secondary | ICD-10-CM | POA: Diagnosis not present

## 2012-02-07 MED ORDER — VALACYCLOVIR HCL 1 G PO TABS: 1000.0000 mg | ORAL_TABLET | Freq: Every day | ORAL | Status: DC | PRN

## 2012-02-07 NOTE — Telephone Encounter (Signed)
Pt called requesting Rx to treat cold sore outbreak on lips. Please advise in Dr Diamantina Monks absence, thanks!

## 2012-02-07 NOTE — Telephone Encounter (Signed)
Pt informed

## 2012-02-07 NOTE — Telephone Encounter (Signed)
Ok - please verify what pharmacy to send rx - thanks

## 2012-02-08 DIAGNOSIS — H2510 Age-related nuclear cataract, unspecified eye: Secondary | ICD-10-CM | POA: Diagnosis not present

## 2012-02-08 DIAGNOSIS — H179 Unspecified corneal scar and opacity: Secondary | ICD-10-CM | POA: Diagnosis not present

## 2012-02-22 ENCOUNTER — Telehealth: Payer: Self-pay

## 2012-02-22 DIAGNOSIS — I10 Essential (primary) hypertension: Secondary | ICD-10-CM

## 2012-02-22 MED ORDER — LOSARTAN POTASSIUM-HCTZ 100-25 MG PO TABS: 1.0000 | ORAL_TABLET | Freq: Every day | ORAL | Status: DC

## 2012-02-22 MED ORDER — ATORVASTATIN CALCIUM 10 MG PO TABS: 10.0000 mg | ORAL_TABLET | Freq: Every day | ORAL | Status: DC

## 2012-02-22 NOTE — Telephone Encounter (Signed)
Pt called requesting refill of BO and cholesterol medication.

## 2012-04-02 ENCOUNTER — Ambulatory Visit: Payer: Medicare Other | Admitting: Internal Medicine

## 2012-04-11 ENCOUNTER — Encounter: Payer: Self-pay | Admitting: Internal Medicine

## 2012-04-11 ENCOUNTER — Other Ambulatory Visit (INDEPENDENT_AMBULATORY_CARE_PROVIDER_SITE_OTHER): Payer: Medicare Other

## 2012-04-11 ENCOUNTER — Ambulatory Visit (INDEPENDENT_AMBULATORY_CARE_PROVIDER_SITE_OTHER): Payer: Medicare Other | Admitting: Internal Medicine

## 2012-04-11 VITALS — BP 122/80 | HR 57 | Temp 98.2°F | Ht 70.0 in | Wt 187.0 lb

## 2012-04-11 DIAGNOSIS — R300 Dysuria: Secondary | ICD-10-CM | POA: Diagnosis not present

## 2012-04-11 DIAGNOSIS — E785 Hyperlipidemia, unspecified: Secondary | ICD-10-CM | POA: Diagnosis not present

## 2012-04-11 DIAGNOSIS — I10 Essential (primary) hypertension: Secondary | ICD-10-CM

## 2012-04-11 LAB — URINALYSIS, ROUTINE W REFLEX MICROSCOPIC
Bilirubin Urine: NEGATIVE
Ketones, ur: NEGATIVE
Specific Gravity, Urine: 1.02 (ref 1.000–1.030)
Urine Glucose: NEGATIVE
pH: 6.5 (ref 5.0–8.0)

## 2012-04-11 LAB — BASIC METABOLIC PANEL
BUN: 25 mg/dL — ABNORMAL HIGH (ref 6–23)
CO2: 30 mEq/L (ref 19–32)
Glucose, Bld: 101 mg/dL — ABNORMAL HIGH (ref 70–99)
Potassium: 4.9 mEq/L (ref 3.5–5.1)
Sodium: 142 mEq/L (ref 135–145)

## 2012-04-11 NOTE — Patient Instructions (Signed)
It was good to see you today. Medications reviewed, no changes at this time. Test(s) ordered today. Your results will be called to you after review (48-72hours after test completion). If any changes need to be made, you will be notified at that time. Please schedule followup in 6 months for blood pressure and cholesterol check, call sooner if problems. Please get information from [facility] regarding prior colonoscopy report

## 2012-04-11 NOTE — Progress Notes (Signed)
  Subjective:    Patient ID: Elizabeth Wilkins, female    DOB: 10-09-1945, 66 y.o.   MRN: [id number]  HPI Here for follow up - reviewed chronic medical issues:  Dyslipidemia - dx 10/2010 and started low dose lipitor- the patient reports compliance with medication(s) as prescribed. Denies adverse side effects.  hypertension - started ARB 01/2011 - titrated up 03/2011, maxed dose 04/2011 and added HCTZ - no chest pain, headache or edema - the patient reports compliance with medication(s) as prescribed. Denies adverse side effects.  Osteopenia - prescribed fosamax 03/2011 after dx "stress injury" by ortho summer 2012, but stopped same due to bone shin pain - s/p dexa 10/2011 - "ok" per pt  Past Medical History  Diagnosis Date  . Hypertension   . Hyperlipidemia   . Osteopenia     Review of Systems  Constitutional: Negative for fever, fatigue and unexpected weight change.  Respiratory: Negative for cough and shortness of breath.   Cardiovascular: Negative for chest pain and leg swelling.  Genitourinary: Positive for dysuria.  Neurological: Negative for headaches.       Objective:   Physical Exam  BP 122/80  Pulse 57  Temp 98.2 F (36.8 C) (Oral)  Ht 5\' 10"  (1.778 m)  Wt 187 lb (84.823 kg)  BMI 26.83 kg/m2  SpO2 99% Wt Readings from Last 3 Encounters:  04/11/12 187 lb (84.823 kg)  10/03/11 186 lb 1.9 oz (84.423 kg)  05/16/11 188 lb 9.6 oz (85.548 kg)   Constitutional: She appears well-developed and well-nourished. No distress. Cardiovascular: Normal rate, regular rhythm and normal heart sounds.  No murmur heard. No BLE edema. Pulmonary/Chest: Effort normal and breath sounds normal. No respiratory distress. She has no wheezes.  Psychiatric: She has a normal mood and affect. Her behavior is normal. Judgment and thought content normal.   Lab Results  Component Value Date   WBC 4.1 11/10/2008   HGB 13.7 11/10/2008   HCT 39.5 11/10/2008   PLT 280 11/10/2008   CHOL 176 10/05/2011   TRIG  89.0 10/05/2011   HDL 57.10 10/05/2011   LDLDIRECT 163.1 11/08/2010   ALT 26 03/08/2011   AST 22 03/08/2011   NA 140 11/12/2008   K 5.2 11/12/2008   CL 103 11/12/2008   CREATININE 0.81 11/12/2008   BUN 21 11/12/2008   CO2 23 11/12/2008   TSH 3.670 04/28/2008       Assessment & Plan:  See problem list. Medications and labs reviewed today.  Dysuria - check UA - tx as needed

## 2012-04-11 NOTE — Assessment & Plan Note (Signed)
Started lipitor for same 10/2010 -last lipids reviewed The current medical regimen is effective;  continue present plan and medications.

## 2012-04-11 NOTE — Assessment & Plan Note (Signed)
Reports optho saw retinal changes 01/2011 ([facility]cker) related to BP started ARB 01/2011, titrated up 03/2011 Maxed ARB and added hctz 04/2011 The current medical regimen is effective;  continue present plan and medications.   BP Readings from Last 3 Encounters:  04/11/12 122/80  10/03/11 122/82  05/16/11 162/100

## 2012-04-19 DIAGNOSIS — B009 Herpesviral infection, unspecified: Secondary | ICD-10-CM | POA: Diagnosis not present

## 2012-04-19 DIAGNOSIS — D1801 Hemangioma of skin and subcutaneous tissue: Secondary | ICD-10-CM | POA: Diagnosis not present

## 2012-04-19 DIAGNOSIS — L219 Seborrheic dermatitis, unspecified: Secondary | ICD-10-CM | POA: Diagnosis not present

## 2012-05-14 ENCOUNTER — Ambulatory Visit (INDEPENDENT_AMBULATORY_CARE_PROVIDER_SITE_OTHER): Payer: Medicare Other | Admitting: Internal Medicine

## 2012-05-14 ENCOUNTER — Encounter: Payer: Self-pay | Admitting: Internal Medicine

## 2012-05-14 VITALS — BP 122/84 | HR 81 | Temp 97.8°F | Resp 16 | Wt 188.0 lb

## 2012-05-14 DIAGNOSIS — L219 Seborrheic dermatitis, unspecified: Secondary | ICD-10-CM

## 2012-05-14 MED ORDER — KETOCONAZOLE 2 % EX CREA: TOPICAL_CREAM | Freq: Two times a day (BID) | CUTANEOUS | Status: AC

## 2012-05-14 MED ORDER — DESONIDE 0.05 % EX LOTN: TOPICAL_LOTION | Freq: Two times a day (BID) | CUTANEOUS | Status: AC

## 2012-05-14 NOTE — Progress Notes (Signed)
  Subjective:    Patient ID: Elizabeth Wilkins, female    DOB: 1945/11/24, 66 y.o.   MRN: [id number]  Rash This is a recurrent problem. The current episode started more than 1 month ago. The problem has been gradually worsening since onset. The affected locations include the face. The rash is characterized by redness, swelling, scaling, itchiness and dryness. She was exposed to nothing. Pertinent negatives include no anorexia, congestion, cough, diarrhea, eye pain, facial edema, fatigue, fever, joint pain, nail changes, rhinorrhea, shortness of breath, sore throat or vomiting. Past treatments include nothing.      Review of Systems  Constitutional: Negative.  Negative for fever and fatigue.  HENT: Negative.  Negative for congestion, sore throat and rhinorrhea.   Eyes: Negative.  Negative for pain.  Respiratory: Negative.  Negative for cough and shortness of breath.   Cardiovascular: Negative.   Gastrointestinal: Negative.  Negative for vomiting, diarrhea and anorexia.  Genitourinary: Negative.   Musculoskeletal: Negative.  Negative for joint pain.  Skin: Positive for rash. Negative for nail changes.  Neurological: Negative.   Hematological: Negative.   Psychiatric/Behavioral: Negative.        Objective:   Physical Exam  Vitals reviewed. Constitutional: She is oriented to person, place, and time. She appears well-developed and well-nourished. No distress.  HENT:  Head:         Over her face there is an area of erythema between the eyebrows with scale and over the lower half of her face around the mouth and left chin area there are macules or hyperpigmentation and scale  Eyes: Conjunctivae are normal. Right eye exhibits no discharge. Left eye exhibits no discharge. No scleral icterus.  Neck: Normal range of motion. Neck supple. No JVD present. No tracheal deviation present. No thyromegaly present.  Cardiovascular: Normal rate, regular rhythm, normal heart sounds and intact distal  pulses.  Exam reveals no gallop and no friction rub.   No murmur heard. Pulmonary/Chest: Effort normal and breath sounds normal. No stridor. No respiratory distress. She has no wheezes. She has no rales. She exhibits no tenderness.  Abdominal: Soft. Bowel sounds are normal. She exhibits no distension and no mass. There is no tenderness. There is no rebound and no guarding.  Musculoskeletal: Normal range of motion. She exhibits no edema and no tenderness.  Lymphadenopathy:    She has no cervical adenopathy.  Neurological: She is oriented to person, place, and time.  Skin: Skin is warm and dry. Rash noted. She is not diaphoretic. There is erythema. No pallor.  Psychiatric: She has a normal mood and affect. Her behavior is normal. Judgment and thought content normal.     Lab Results  Component Value Date   WBC 4.1 11/10/2008   HGB 13.7 11/10/2008   HCT 39.5 11/10/2008   PLT 280 11/10/2008   GLUCOSE 101* 04/11/2012   CHOL 176 10/05/2011   TRIG 89.0 10/05/2011   HDL 57.10 10/05/2011   LDLDIRECT 163.1 11/08/2010   LDLCALC 101* 10/05/2011   ALT 26 03/08/2011   AST 22 03/08/2011   NA 142 04/11/2012   K 4.9 04/11/2012   CL 105 04/11/2012   CREATININE 1.1 04/11/2012   BUN 25* 04/11/2012   CO2 30 04/11/2012   TSH 3.670 04/28/2008       Assessment & Plan:

## 2012-05-14 NOTE — Patient Instructions (Signed)
Seborrheic Dermatitis Seborrheic dermatitis involves pink or red skin with greasy, flaky scales. This is often found on the scalp, eyebrows, nose, bearded area, and on or behind the ears. It can also occur on the central chest. It often occurs where there are more oil (sebaceous) glands. This condition is also known as dandruff. When this condition affects a baby's scalp, it is called cradle cap. It may come and go for no known reason. It can occur at any time of life from infancy to old age. CAUSES  The cause is unknown. It is not the result of too little moisture or too much oil. In some people, seborrheic dermatitis flare-ups seem to be triggered by stress. It also commonly occurs in people with certain diseases such as Parkinson's disease or HIV/AIDS. SYMPTOMS   Thick scales on the scalp.   Redness on the face or in the armpits.   The skin may seem oily or dry, but moisturizers do not help.   In infants, seborrheic dermatitis appears as scaly redness that does not seem to bother the baby. In some babies, it affects only the scalp. In others, it also affects the neck creases, armpits, groin, or behind the ears.   In adults and adolescents, seborrheic dermatitis may affect only the scalp. It may look patchy or spread out, with areas of redness and flaking. Other areas commonly affected include:   Eyebrows.   Eyelids.   Forehead.   Skin behind the ears.   Outer ears.   Chest.   Armpits.   Nose creases.   Skin creases under the breasts.   Skin between the buttocks.   Groin.   Some adults and adolescents feel itching or burning in the affected areas.  DIAGNOSIS  Your caregiver can usually tell what the problem is by doing a physical exam. TREATMENT   Cortisone (steroid) ointments, creams, and lotions can help decrease inflammation.   Babies can be treated with baby oil to soften the scales, then they may be washed with baby shampoo. If this does not help, medicated  shampoos should work.   Adults can also use medicated shampoos.   Your caregiver may prescribe corticosteroid cream and shampoo containing an antifungal or yeast medicine (ketoconazole). Hydrocortisone or anti-yeast cream can be rubbed directly onto seborrheic dermatitis patches. Yeast does not cause seborrheic dermatitis, but it seems to add to the problem.  In infants, seborrheic dermatitis is often worst during the first year of life. It tends to disappear on its own as the child grows. However, it may return during the teenage years. In adults and adolescents, seborrheic dermatitis tends to be a long-lasting condition that comes and goes over many years. HOME CARE INSTRUCTIONS   Use prescribed medicines as directed.   In infants, do not aggressively remove the scales or flakes on the scalp with a comb or by other means. This may lead to hair loss.  SEEK MEDICAL CARE IF:   The problem does not improve from the medicated shampoos, lotions, or other medicines given by your caregiver.   You have any other questions or concerns.  Document Released: 09/12/2005 Document Revised: 09/01/2011 Document Reviewed: 02/01/2010 [employer] Patient Information 2012 [employer]. 

## 2012-05-14 NOTE — Assessment & Plan Note (Signed)
will treat the infection with ketoconazole and will reduce the rash and other symptoms with des-owen lotion

## 2012-05-17 DIAGNOSIS — L259 Unspecified contact dermatitis, unspecified cause: Secondary | ICD-10-CM | POA: Diagnosis not present

## 2012-07-04 ENCOUNTER — Other Ambulatory Visit: Payer: Self-pay | Admitting: Internal Medicine

## 2012-10-29 ENCOUNTER — Other Ambulatory Visit: Payer: Self-pay | Admitting: Internal Medicine

## 2012-11-02 DIAGNOSIS — M2350 Chronic instability of knee, unspecified knee: Secondary | ICD-10-CM | POA: Diagnosis not present

## 2013-01-16 DIAGNOSIS — H40019 Open angle with borderline findings, low risk, unspecified eye: Secondary | ICD-10-CM | POA: Diagnosis not present

## 2013-01-16 DIAGNOSIS — H35379 Puckering of macula, unspecified eye: Secondary | ICD-10-CM | POA: Diagnosis not present

## 2013-01-16 DIAGNOSIS — H04129 Dry eye syndrome of unspecified lacrimal gland: Secondary | ICD-10-CM | POA: Diagnosis not present

## 2013-01-16 DIAGNOSIS — H35039 Hypertensive retinopathy, unspecified eye: Secondary | ICD-10-CM | POA: Diagnosis not present

## 2013-02-12 DIAGNOSIS — S96819A Strain of other specified muscles and tendons at ankle and foot level, unspecified foot, initial encounter: Secondary | ICD-10-CM | POA: Diagnosis not present

## 2013-02-12 DIAGNOSIS — M79609 Pain in unspecified limb: Secondary | ICD-10-CM | POA: Diagnosis not present

## 2013-02-12 DIAGNOSIS — M25579 Pain in unspecified ankle and joints of unspecified foot: Secondary | ICD-10-CM | POA: Diagnosis not present

## 2013-02-12 DIAGNOSIS — S93499A Sprain of other ligament of unspecified ankle, initial encounter: Secondary | ICD-10-CM | POA: Diagnosis not present

## 2013-03-08 DIAGNOSIS — IMO0001 Reserved for inherently not codable concepts without codable children: Secondary | ICD-10-CM | POA: Diagnosis not present

## 2013-03-28 DIAGNOSIS — H5710 Ocular pain, unspecified eye: Secondary | ICD-10-CM | POA: Diagnosis not present

## 2013-04-04 DIAGNOSIS — H16149 Punctate keratitis, unspecified eye: Secondary | ICD-10-CM | POA: Diagnosis not present

## 2013-04-05 DIAGNOSIS — S92309A Fracture of unspecified metatarsal bone(s), unspecified foot, initial encounter for closed fracture: Secondary | ICD-10-CM | POA: Diagnosis not present

## 2013-04-12 ENCOUNTER — Telehealth: Payer: Self-pay | Admitting: *Deleted

## 2013-04-12 NOTE — Telephone Encounter (Signed)
Called pt no answer LMOM with regina response...Raechel Chute

## 2013-04-12 NOTE — Telephone Encounter (Signed)
Azo and increase fluids

## 2013-04-12 NOTE — Telephone Encounter (Signed)
Left msg on vm mail getting ready to go out of town. Starting to have Uti sxs wanting to know what can she take over the counter...Elizabeth Wilkins

## 2013-04-18 ENCOUNTER — Encounter: Payer: Medicare Other | Admitting: Internal Medicine

## 2013-05-20 ENCOUNTER — Encounter: Payer: Self-pay | Admitting: Internal Medicine

## 2013-05-20 ENCOUNTER — Ambulatory Visit (INDEPENDENT_AMBULATORY_CARE_PROVIDER_SITE_OTHER): Payer: Medicare Other | Admitting: Internal Medicine

## 2013-05-20 VITALS — BP 138/82 | HR 62 | Temp 97.7°F | Ht 70.0 in | Wt 184.0 lb

## 2013-05-20 DIAGNOSIS — Z0000 Encounter for general adult medical examination without abnormal findings: Secondary | ICD-10-CM | POA: Diagnosis not present

## 2013-05-20 DIAGNOSIS — I10 Essential (primary) hypertension: Secondary | ICD-10-CM

## 2013-05-20 DIAGNOSIS — Z1211 Encounter for screening for malignant neoplasm of colon: Secondary | ICD-10-CM

## 2013-05-20 DIAGNOSIS — E785 Hyperlipidemia, unspecified: Secondary | ICD-10-CM | POA: Diagnosis not present

## 2013-05-20 DIAGNOSIS — M899 Disorder of bone, unspecified: Secondary | ICD-10-CM | POA: Diagnosis not present

## 2013-05-20 NOTE — Progress Notes (Signed)
Subjective:    Patient ID: Elizabeth Wilkins, female    DOB: 03-Nov-1945, 67 y.o.   MRN: [id number]  HPI  Here for medicare wellness  Diet: heart healthy or DM if diabetic Physical activity: sedentary Depression/mood screen: negative Hearing: intact to whispered voice Visual acuity: grossly normal, performs annual eye exam  ADLs: capable Fall risk: none Home safety: good Cognitive evaluation: intact to orientation, naming, recall and repetition EOL planning: adv directives, full code/ I agree  I have personally reviewed and have noted 1. The patient's medical and social history 2. Their use of alcohol, tobacco or illicit drugs 3. Their current medications and supplements 4. The patient's functional ability including ADL's, fall risks, home safety risks and hearing or visual impairment. 5. Diet and physical activities 6. Evidence for depression or mood disorders  Also reviewed chronic medical issues:  Dyslipidemia - dx 10/2010 and started low dose lipitor- stopped same indepdenantly early 2014.   hypertension - started ARB 01/2011 - titrated up 03/2011, maxed dose 04/2011 and added HCTZ; stopped same 10/2012 because of low normal reading at home ([id number]) - no chest pain, headache or edema -  Osteopenia - prescribed fosamax 03/2011 after dx "stress injury" by ortho summer 2012, but stopped same due to bone shin pain - s/p dexa 10/2011 - "ok" per pt  Past Medical History  Diagnosis Date  . Hypertension   . Hyperlipidemia   . Osteopenia    Family History  Problem Relation Age of Onset  . Cancer Mother     Pancreatic  . Hypertension Mother   . Hyperlipidemia Mother   . Lupus Father    History  Substance Use Topics  . Smoking status: Never Smoker   . Smokeless tobacco: Not on file  . Alcohol Use: No    Review of Systems  Constitutional: Negative for fever, fatigue and unexpected weight change.  Respiratory: Negative for cough and shortness of breath.   Cardiovascular:  Negative for chest pain and leg swelling.  Genitourinary: Negative for dysuria and difficulty urinating.  Neurological: Negative for headaches.  No other specific complaints in a complete review of systems (except as listed in HPI above).      Objective:   Physical Exam BP 138/82  Pulse 62  Temp(Src) 97.7 F (36.5 C) (Oral)  Ht 5\' 10"  (1.778 m)  Wt 184 lb (83.462 kg)  BMI 26.4 kg/m2  SpO2 96% Wt Readings from Last 3 Encounters:  05/20/13 184 lb (83.462 kg)  05/14/12 188 lb (85.276 kg)  04/11/12 187 lb (84.823 kg)   Constitutional: She appears well-developed and well-nourished. No distress. Cardiovascular: Normal rate, regular rhythm and normal heart sounds.  No murmur heard. No BLE edema. Pulmonary/Chest: Effort normal and breath sounds normal. No respiratory distress. She has no wheezes.  Psychiatric: She has a normal mood and affect. Her behavior is normal. Judgment and thought content normal.   Lab Results  Component Value Date   WBC 4.1 11/10/2008   HGB 13.7 11/10/2008   HCT 39.5 11/10/2008   PLT 280 11/10/2008   CHOL 176 10/05/2011   TRIG 89.0 10/05/2011   HDL 57.10 10/05/2011   LDLDIRECT 163.1 11/08/2010   ALT 26 03/08/2011   AST 22 03/08/2011   NA 142 04/11/2012   K 4.9 04/11/2012   CL 105 04/11/2012   CREATININE 1.1 04/11/2012   BUN 25* 04/11/2012   CO2 30 04/11/2012   TSH 3.670 04/28/2008   ECG: NSR @ 56 bom - no ischemic changes  Assessment & Plan:  AWV/v70.0 - Today patient counseled on age appropriate routine health concerns for screening and prevention, each reviewed and up to date or declined. Immunizations reviewed and up to date or declined. Labs/ECG reviewed. Risk factors for depression reviewed and negative. Hearing function and visual acuity are intact. ADLs screened and addressed as needed. Functional ability and level of safety reviewed and appropriate. Education, counseling and referrals performed based on assessed risks today. Patient provided with a copy of  personalized plan for preventive services.  Also see problem list. Medications and labs reviewed today.

## 2013-05-20 NOTE — Assessment & Plan Note (Signed)
Started lipitor for same 10/2010, but self DC'd same Check annually and tx as needed The patient is asked to make an attempt to improve diet and exercise patterns to aid in medical management of this problem.

## 2013-05-20 NOTE — Patient Instructions (Addendum)
It was good to see you today. We have reviewed your prior records including labs and tests today Health Maintenance reviewed - all recommended immunizations and age-appropriate screenings are up-to-date. Medications reviewed and updated, no changes at this time. Test(s) ordered today. Your results will be called to you after review (48-72hours after test completion). If any changes need to be made, you will be notified at that time. Please schedule followup in 12 months for blood pressure and cholesterol check, call sooner if problems. we'll make referral for colonoscopy. Our office will contact you regarding appointment(s) once made.  Health Maintenance, Females A healthy lifestyle and preventative care can promote health and wellness.  Maintain regular health, dental, and eye exams.  Eat a healthy diet. Foods like vegetables, fruits, whole grains, low-fat dairy products, and lean protein foods contain the nutrients you need without too many calories. Decrease your intake of foods high in solid fats, added sugars, and salt. Get information about a proper diet from your caregiver, if necessary.  Regular physical exercise is one of the most important things you can do for your health. Most adults should get at least 150 minutes of moderate-intensity exercise (any activity that increases your heart rate and causes you to sweat) each week. In addition, most adults need muscle-strengthening exercises on 2 or more days a week.   Maintain a healthy weight. The body mass index (BMI) is a screening tool to identify possible weight problems. It provides an estimate of body fat based on height and weight. Your caregiver can help determine your BMI, and can help you achieve or maintain a healthy weight. For adults 20 years and older:  A BMI below 18.5 is considered underweight.  A BMI of 18.5 to 24.9 is normal.  A BMI of 25 to 29.9 is considered overweight.  A BMI of 30 and above is considered  obese.  Maintain normal blood lipids and cholesterol by exercising and minimizing your intake of saturated fat. Eat a balanced diet with plenty of fruits and vegetables. Blood tests for lipids and cholesterol should begin at age 37 and be repeated every 5 years. If your lipid or cholesterol levels are high, you are over 50, or you are a high risk for heart disease, you may need your cholesterol levels checked more frequently.Ongoing high lipid and cholesterol levels should be treated with medicines if diet and exercise are not effective.  If you smoke, find out from your caregiver how to quit. If you do not use tobacco, do not start.  If you are pregnant, do not drink alcohol. If you are breastfeeding, be very cautious about drinking alcohol. If you are not pregnant and choose to drink alcohol, do not exceed 1 drink per day. One drink is considered to be 12 ounces (355 mL) of beer, 5 ounces (148 mL) of wine, or 1.5 ounces (44 mL) of liquor.  Avoid use of street drugs. Do not share needles with anyone. Ask for help if you need support or instructions about stopping the use of drugs.  High blood pressure causes heart disease and increases the risk of stroke. Blood pressure should be checked at least every 1 to 2 years. Ongoing high blood pressure should be treated with medicines, if weight loss and exercise are not effective.  If you are 64 to 68 years old, ask your caregiver if you should take aspirin to prevent strokes.  Diabetes screening involves taking a blood sample to check your fasting blood sugar level. This should  be done once every 3 years, after age 87, if you are within normal weight and without risk factors for diabetes. Testing should be considered at a younger age or be carried out more frequently if you are overweight and have at least 1 risk factor for diabetes.  Breast cancer screening is essential preventative care for women. You should practice "breast self-awareness." This means  understanding the normal appearance and feel of your breasts and may include breast self-examination. Any changes detected, no matter how small, should be reported to a caregiver. Women in their 21s and 30s should have a clinical breast exam (CBE) by a caregiver as part of a regular health exam every 1 to 3 years. After age 92, women should have a CBE every year. Starting at age 36, women should consider having a mammogram (breast X-ray) every year. Women who have a family history of breast cancer should talk to their caregiver about genetic screening. Women at a high risk of breast cancer should talk to their caregiver about having an MRI and a mammogram every year.  The Pap test is a screening test for cervical cancer. Women should have a Pap test starting at age 46. Between ages 31 and 63, Pap tests should be repeated every 2 years. Beginning at age 52, you should have a Pap test every 3 years as long as the past 3 Pap tests have been normal. If you had a hysterectomy for a problem that was not cancer or a condition that could lead to cancer, then you no longer need Pap tests. If you are between ages 15 and 22, and you have had normal Pap tests going back 10 years, you no longer need Pap tests. If you have had past treatment for cervical cancer or a condition that could lead to cancer, you need Pap tests and screening for cancer for at least 20 years after your treatment. If Pap tests have been discontinued, risk factors (such as a new sexual partner) need to be reassessed to determine if screening should be resumed. Some women have medical problems that increase the chance of getting cervical cancer. In these cases, your caregiver may recommend more frequent screening and Pap tests.  The human papillomavirus (HPV) test is an additional test that may be used for cervical cancer screening. The HPV test looks for the virus that can cause the cell changes on the cervix. The cells collected during the Pap test  can be tested for HPV. The HPV test could be used to screen women aged 4 years and older, and should be used in women of any age who have unclear Pap test results. After the age of 51, women should have HPV testing at the same frequency as a Pap test.  Colorectal cancer can be detected and often prevented. Most routine colorectal cancer screening begins at the age of 34 and continues through age 30. However, your caregiver may recommend screening at an earlier age if you have risk factors for colon cancer. On a yearly basis, your caregiver may provide home test kits to check for hidden blood in the stool. Use of a small camera at the end of a tube, to directly examine the colon (sigmoidoscopy or colonoscopy), can detect the earliest forms of colorectal cancer. Talk to your caregiver about this at age 36, when routine screening begins. Direct examination of the colon should be repeated every 5 to 10 years through age 66, unless early forms of pre-cancerous polyps or small growths are  found.  Hepatitis C blood testing is recommended for all people born from [birth year] through 1966 and any individual with known risks for hepatitis C.  Practice safe sex. Use condoms and avoid high-risk sexual practices to reduce the spread of sexually transmitted infections (STIs). Sexually active women aged 46 and younger should be checked for Chlamydia, which is a common sexually transmitted infection. Older women with new or multiple partners should also be tested for Chlamydia. Testing for other STIs is recommended if you are sexually active and at increased risk.  Osteoporosis is a disease in which the bones lose minerals and strength with aging. This can result in serious bone fractures. The risk of osteoporosis can be identified using a bone density scan. Women ages 76 and over and women at risk for fractures or osteoporosis should discuss screening with their caregivers. Ask your caregiver whether you should be taking a  calcium supplement or vitamin D to reduce the rate of osteoporosis.  Menopause can be associated with physical symptoms and risks. Hormone replacement therapy is available to decrease symptoms and risks. You should talk to your caregiver about whether hormone replacement therapy is right for you.  Use sunscreen with a sun protection factor (SPF) of 30 or greater. Apply sunscreen liberally and repeatedly throughout the day. You should seek shade when your shadow is shorter than you. Protect yourself by wearing long sleeves, pants, a wide-brimmed hat, and sunglasses year round, whenever you are outdoors.  Notify your caregiver of new moles or changes in moles, especially if there is a change in shape or color. Also notify your caregiver if a mole is larger than the size of a pencil eraser.  Stay current with your immunizations. Document Released: 03/28/2011 Document Revised: 12/05/2011 Document Reviewed: 03/28/2011 [facility] Patient Information 2014 [place], [place].

## 2013-05-20 NOTE — Assessment & Plan Note (Signed)
Reports optho saw retinal changes 01/2011 (hecker) related to BP started ARB 01/2011, titrated up 03/2011 Maxed ARB and added hctz 04/2011 Self stopped all meds 10/2012 due to low normal BP and dizziness  working on diet and weight mgmt ?white coat  BP Readings from Last 3 Encounters:  05/20/13 138/82  05/14/12 122/84  04/11/12 122/80

## 2013-05-24 ENCOUNTER — Encounter: Payer: Self-pay | Admitting: Internal Medicine

## 2013-07-12 DIAGNOSIS — H40019 Open angle with borderline findings, low risk, unspecified eye: Secondary | ICD-10-CM | POA: Diagnosis not present

## 2013-07-12 DIAGNOSIS — H1130 Conjunctival hemorrhage, unspecified eye: Secondary | ICD-10-CM | POA: Diagnosis not present

## 2013-07-12 DIAGNOSIS — H04129 Dry eye syndrome of unspecified lacrimal gland: Secondary | ICD-10-CM | POA: Diagnosis not present

## 2013-07-24 ENCOUNTER — Ambulatory Visit (AMBULATORY_SURGERY_CENTER): Payer: Self-pay

## 2013-07-24 ENCOUNTER — Other Ambulatory Visit (INDEPENDENT_AMBULATORY_CARE_PROVIDER_SITE_OTHER): Payer: Medicare Other

## 2013-07-24 VITALS — Ht 70.0 in | Wt 184.0 lb

## 2013-07-24 DIAGNOSIS — I10 Essential (primary) hypertension: Secondary | ICD-10-CM

## 2013-07-24 DIAGNOSIS — Z1211 Encounter for screening for malignant neoplasm of colon: Secondary | ICD-10-CM

## 2013-07-24 DIAGNOSIS — E785 Hyperlipidemia, unspecified: Secondary | ICD-10-CM | POA: Diagnosis not present

## 2013-07-24 LAB — LIPID PANEL
HDL: 52.4 mg/dL (ref >39.00)
Triglycerides: 82 mg/dL (ref 0.0–149.0)
VLDL: 16.4 mg/dL (ref 0.0–40.0)

## 2013-07-24 LAB — BASIC METABOLIC PANEL
BUN: 18 mg/dL (ref 6–23)
Calcium: 9.5 mg/dL (ref 8.4–10.5)
GFR: 94.58 mL/min (ref >60.00)
Glucose, Bld: 91 mg/dL (ref 70–99)
Potassium: 4.8 mEq/L (ref 3.5–5.1)

## 2013-07-24 LAB — LDL CHOLESTEROL, DIRECT: Direct LDL: 140.3 mg/dL

## 2013-07-24 MED ORDER — SUPREP BOWEL PREP KIT 17.5-3.13-1.6 GM/177ML PO SOLN: 1.0000 | Freq: Once | ORAL | Status: DC

## 2013-07-25 ENCOUNTER — Ambulatory Visit (INDEPENDENT_AMBULATORY_CARE_PROVIDER_SITE_OTHER): Payer: Medicare Other

## 2013-07-25 DIAGNOSIS — Z23 Encounter for immunization: Secondary | ICD-10-CM

## 2013-07-31 ENCOUNTER — Other Ambulatory Visit: Payer: Self-pay | Admitting: Internal Medicine

## 2013-08-02 ENCOUNTER — Encounter: Payer: Self-pay | Admitting: Internal Medicine

## 2013-08-02 ENCOUNTER — Ambulatory Visit (AMBULATORY_SURGERY_CENTER): Payer: Medicare Other | Admitting: Internal Medicine

## 2013-08-02 VITALS — BP 149/101 | HR 57 | Temp 98.7°F | Resp 18 | Ht 70.0 in | Wt 184.0 lb

## 2013-08-02 DIAGNOSIS — Z1211 Encounter for screening for malignant neoplasm of colon: Secondary | ICD-10-CM

## 2013-08-02 DIAGNOSIS — K5730 Diverticulosis of large intestine without perforation or abscess without bleeding: Secondary | ICD-10-CM

## 2013-08-02 DIAGNOSIS — I10 Essential (primary) hypertension: Secondary | ICD-10-CM | POA: Diagnosis not present

## 2013-08-02 HISTORY — PX: COLONOSCOPY: SHX174

## 2013-08-02 MED ORDER — SODIUM CHLORIDE 0.9 % IV SOLN: 500.0000 mL | INTRAVENOUS | Status: DC

## 2013-08-02 NOTE — Progress Notes (Signed)
Report to pacu rn, vss, bbs=clear 

## 2013-08-02 NOTE — Progress Notes (Signed)
Patient did not experience any of the following events: a burn prior to discharge; a fall within the facility; wrong site/side/patient/procedure/implant event; or a hospital transfer or hospital admission upon discharge from the facility. (G8907) Patient did not have preoperative order for IV antibiotic SSI prophylaxis. (G8918)  

## 2013-08-02 NOTE — Patient Instructions (Addendum)
No polyps seen.  You do have a condition called diverticulosis - common and not usually a problem. Please read the handout provided.  Next routine colonoscopy in 10 years - 2024 possibly - at 77 may not be needed.  I appreciate the opportunity to care for you. Iva Boop, MD, FACG  YOU HAD AN ENDOSCOPIC PROCEDURE TODAY AT THE [facility]: Refer to the procedure report that was given to you for any specific questions about what was found during the examination.  If the procedure report does not answer your questions, please call your gastroenterologist to clarify.  If you requested that your care partner not be given the details of your procedure findings, then the procedure report has been included in a sealed envelope for you to review at your convenience later.  YOU SHOULD EXPECT: Some feelings of bloating in the abdomen. Passage of more gas than usual.  Walking can help get rid of the air that was put into your GI tract during the procedure and reduce the bloating. If you had a lower endoscopy (such as a colonoscopy or flexible sigmoidoscopy) you may notice spotting of blood in your stool or on the toilet paper. If you underwent a bowel prep for your procedure, then you may not have a normal bowel movement for a few days.  DIET: Your first meal following the procedure should be a light meal and then it is ok to progress to your normal diet.  A half-sandwich or bowl of soup is an example of a good first meal.  Heavy or fried foods are harder to digest and may make you feel nauseous or bloated.  Likewise meals heavy in dairy and vegetables can cause extra gas to form and this can also increase the bloating.  Drink plenty of fluids but you should avoid alcoholic beverages for 24 hours.  ACTIVITY: Your care partner should take you home directly after the procedure.  You should plan to take it easy, moving slowly for the rest of the day.  You can resume normal activity the day after  the procedure however you should NOT DRIVE or use heavy machinery for 24 hours (because of the sedation medicines used during the test).    SYMPTOMS TO REPORT IMMEDIATELY: A gastroenterologist can be reached at any hour.  During normal business hours, 8:30 AM to 5:00 PM Monday through Friday, call [phone].  After hours and on weekends, please call the GI answering service at [phone] who will take a message and have the physician on call contact you.   Following lower endoscopy (colonoscopy or flexible sigmoidoscopy):  Excessive amounts of blood in the stool  Significant tenderness or worsening of abdominal pains  Swelling of the abdomen that is new, acute  Fever of 100F or higher  FOLLOW UP: If any biopsies were taken you will be contacted by phone or by letter within the next 1-3 weeks.  Call your gastroenterologist if you have not heard about the biopsies in 3 weeks.  Our staff will call the home number listed on your records the next business day following your procedure to check on you and address any questions or concerns that you may have at that time regarding the information given to you following your procedure. This is a courtesy call and so if there is no answer at the home number and we have not heard from you through the emergency physician on call, we will assume that you have returned to your  regular daily activities without incident.  SIGNATURES/CONFIDENTIALITY: You and/or your care partner have signed paperwork which will be entered into your electronic medical record.  These signatures attest to the fact that that the information above on your After Visit Summary has been reviewed and is understood.  Full responsibility of the confidentiality of this discharge information lies with you and/or your care-partner.

## 2013-08-02 NOTE — Op Note (Signed)
[facility] [address] [place], [address]   COLONOSCOPY PROCEDURE REPORT  PATIENT: Elizabeth Wilkins, Elizabeth Wilkins  MR#: [id number] BIRTHDATE: 01-28-1946 , 67  yrs. old GENDER: Female ENDOSCOPIST: Iva Boop, MD, [facility] REFERRED [id number] Felicity Coyer, M.D. PROCEDURE DATE:  08/02/2013 PROCEDURE:   Colonoscopy, screening First Screening Colonoscopy - Avg.  risk and is 50 yrs.  old or older - No.  Prior Negative Screening - Now for repeat screening. 10 or more years since last screening  History of Adenoma - Now for follow-up colonoscopy & has been > or = to 3 yrs.  N/A  Polyps Removed Today? No.  Recommend repeat exam, <10 yrs? No. ASA CLASS:   Class II INDICATIONS:average risk screening and Last colonoscopy performed 10 years ago. MEDICATIONS: propofol (Diprivan) 300mg  IV, MAC sedation, administered by CRNA, and These medications were titrated to patient response per physician's verbal order  DESCRIPTION OF PROCEDURE:   After the risks benefits and alternatives of the procedure were thoroughly explained, informed consent was obtained.  A digital rectal exam revealed no abnormalities of the rectum.   The LB [id number]  endoscope was introduced through the anus and advanced to the cecum, which was identified by both the appendix and ileocecal valve. No adverse events experienced.   The quality of the prep was excellent using Suprep  The instrument was then slowly withdrawn as the colon was fully examined.  COLON FINDINGS: There was mild scattered diverticulosis noted in the sigmoid colon.   The colon mucosa was otherwise normal.   A right colon retroflexion was performed.  Retroflexed views revealed no abnormalities. The time to cecum=2 minutes 34 seconds.  Withdrawal time=7 minutes 31 seconds.  The scope was withdrawn and the procedure completed. COMPLICATIONS: There were no complications.  ENDOSCOPIC IMPRESSION: 1.   There was mild diverticulosis noted in the  sigmoid colon 2.   The colon mucosa was otherwise normal - excellent prep - second colonoscopy  RECOMMENDATIONS: Repeat colonoscopy 10 years - 2024 -if fit at 57 can consider   eSigned:  Iva Boop, MD, [facility] 08/02/2013 8:39 AM   cc: The Patient and Newt Lukes, MD

## 2013-08-05 ENCOUNTER — Telehealth: Payer: Self-pay

## 2013-08-05 ENCOUNTER — Ambulatory Visit: Payer: Medicare Other | Admitting: Internal Medicine

## 2013-08-05 NOTE — Telephone Encounter (Signed)
  Follow up Call-  Call back number 08/02/2013  Post procedure Call Back phone  # [phone]  Permission to leave phone message Yes     Patient questions:  Do you have a fever, pain , or abdominal swelling? no Pain Score  0 *  Have you tolerated food without any problems? yes  Have you been able to return to your normal activities? yes  Do you have any questions about your discharge instructions: Diet   no Medications  no Follow up visit  no  Do you have questions or concerns about your Care? no  Actions: * If pain score is 4 or above: No action needed, pain <4.

## 2013-09-06 DIAGNOSIS — Z0000 Encounter for general adult medical examination without abnormal findings: Secondary | ICD-10-CM | POA: Diagnosis not present

## 2013-09-06 DIAGNOSIS — N951 Menopausal and female climacteric states: Secondary | ICD-10-CM | POA: Diagnosis not present

## 2013-09-06 DIAGNOSIS — Z01419 Encounter for gynecological examination (general) (routine) without abnormal findings: Secondary | ICD-10-CM | POA: Diagnosis not present

## 2014-05-02 ENCOUNTER — Ambulatory Visit (INDEPENDENT_AMBULATORY_CARE_PROVIDER_SITE_OTHER): Payer: Medicare Other | Admitting: Internal Medicine

## 2014-05-02 ENCOUNTER — Encounter: Payer: Self-pay | Admitting: Internal Medicine

## 2014-05-02 VITALS — BP 175/103 | HR 69 | Temp 98.8°F | Wt 186.1 lb

## 2014-05-02 DIAGNOSIS — M79609 Pain in unspecified limb: Secondary | ICD-10-CM

## 2014-05-02 DIAGNOSIS — M79661 Pain in right lower leg: Secondary | ICD-10-CM

## 2014-05-02 DIAGNOSIS — I10 Essential (primary) hypertension: Secondary | ICD-10-CM

## 2014-05-02 NOTE — Patient Instructions (Signed)
Use an anti-inflammatory cream such as Aspercreme or Zostrix cream twice a day to the affected area as needed. In lieu of this warm moist compresses or  hot water bottle can be used. Do not apply ice .Consider glucosamine sulfate 1500 mg daily  for 4-6 weeks to rehydrate the cartilages.   Minimal Blood Pressure Goal= AVERAGE < 140/90;  Ideal is an AVERAGE < 135/85. This AVERAGE should be calculated from @ least 5-7 BP readings taken @ different times of day on different days of week. You should not respond to isolated BP readings , but rather the AVERAGE for that week .Please bring your  blood pressure cuff to office visits to verify that it is reliable.It  can also be checked against the blood pressure device at the pharmacy. Finger or wrist cuffs are not dependable; an arm cuff is.

## 2014-05-02 NOTE — Progress Notes (Signed)
First blood pressure reading was with an automatic machine with a normal size cuff on the right arm (see vitals for recording). Second blood pressure reading was on the left arm with a normal size manual cuff and the reading is 160/90.

## 2014-05-02 NOTE — Progress Notes (Signed)
Pre visit review using our clinic review tool, if applicable. No additional management support is needed unless otherwise documented below in the visit note. 

## 2014-05-02 NOTE — Progress Notes (Signed)
   Subjective:    Patient ID: Elizabeth Wilkins, female    DOB: 01-02-1946, 68 y.o.   MRN: [id number]  HPI  She describes several months of discomfort in the right shin from the ankle to the knee. There was no trauma or injury.  It is worse at night lasting minutes and described as hot and sharp up to a level VIII.  She has not treated this in any  fashion.  She does not monitor blood pressure. She shares she has a "white coat syndrome".  She did miss her blood pressure medicines last  & has not taken it today.  She does eat salt.  She denies any adverse effects from the blood pressure medicine.     Review of Systems  She denies any fever, chills, sweats, weight loss  She's had no rash or change in temperature or color skin in this area.  Chest pain, palpitations, tachycardia, exertional dyspnea, paroxysmal nocturnal dyspnea, claudication or edema are absent.        Objective:   Physical Exam  There is no tenderness to palpation or percussion over the shin. There is no change in color or temperature in this area.  Appears healthy and well-nourished & in no acute distress  No carotid bruits are present.No neck pain distention present at 10 - 15 degrees. Thyroid normal to palpation  Heart rhythm and rate are normal with no gallop or murmur  Chest is clear with no increased work of breathing  There is no evidence of aortic aneurysm or renal artery bruits  Abdomen soft with no organomegaly or masses. No HJR  No clubbing, cyanosis or edema present.  Pedal pulses are intact   No ischemic skin changes are present . Fingernails/ toenails healthy   Alert and oriented. Strength, tone, DTRs reflexes normal          Assessment & Plan:  #1 R shin pain, ? compartmental syndrome #2 HTN  See AVS

## 2014-08-08 ENCOUNTER — Ambulatory Visit (INDEPENDENT_AMBULATORY_CARE_PROVIDER_SITE_OTHER): Payer: Medicare Other | Admitting: *Deleted

## 2014-08-08 ENCOUNTER — Ambulatory Visit: Payer: Medicare Other

## 2014-08-08 DIAGNOSIS — Z23 Encounter for immunization: Secondary | ICD-10-CM | POA: Diagnosis not present

## 2014-08-25 DIAGNOSIS — H40013 Open angle with borderline findings, low risk, bilateral: Secondary | ICD-10-CM | POA: Diagnosis not present

## 2014-08-25 DIAGNOSIS — H2513 Age-related nuclear cataract, bilateral: Secondary | ICD-10-CM | POA: Diagnosis not present

## 2014-08-25 DIAGNOSIS — H35033 Hypertensive retinopathy, bilateral: Secondary | ICD-10-CM | POA: Diagnosis not present

## 2014-08-25 DIAGNOSIS — H25013 Cortical age-related cataract, bilateral: Secondary | ICD-10-CM | POA: Diagnosis not present

## 2014-09-11 ENCOUNTER — Telehealth: Payer: Self-pay | Admitting: Internal Medicine

## 2014-09-11 MED ORDER — LOSARTAN POTASSIUM-HCTZ 100-25 MG PO TABS: 1.0000 | ORAL_TABLET | Freq: Every day | ORAL | Status: DC

## 2014-09-11 NOTE — Telephone Encounter (Signed)
Pt request refill for losartan for 3 month supply. She was schedule for a physical on 09/22/14 and had to rs to 12/01/14. Please advise.

## 2014-09-11 NOTE — Telephone Encounter (Signed)
erx done.   Elizabeth Wilkins, will you call the pt please.

## 2014-09-22 ENCOUNTER — Encounter: Payer: Medicare Other | Admitting: Internal Medicine

## 2014-11-20 DIAGNOSIS — L900 Lichen sclerosus et atrophicus: Secondary | ICD-10-CM | POA: Diagnosis not present

## 2014-12-01 ENCOUNTER — Ambulatory Visit (INDEPENDENT_AMBULATORY_CARE_PROVIDER_SITE_OTHER): Payer: Medicare Other | Admitting: Internal Medicine

## 2014-12-01 ENCOUNTER — Encounter: Payer: Self-pay | Admitting: Internal Medicine

## 2014-12-01 VITALS — BP 140/84 | HR 72 | Temp 98.2°F | Resp 18 | Wt 190.0 lb

## 2014-12-01 DIAGNOSIS — E785 Hyperlipidemia, unspecified: Secondary | ICD-10-CM | POA: Diagnosis not present

## 2014-12-01 DIAGNOSIS — Z1239 Encounter for other screening for malignant neoplasm of breast: Secondary | ICD-10-CM

## 2014-12-01 DIAGNOSIS — H6122 Impacted cerumen, left ear: Secondary | ICD-10-CM

## 2014-12-01 DIAGNOSIS — Z23 Encounter for immunization: Secondary | ICD-10-CM

## 2014-12-01 DIAGNOSIS — I10 Essential (primary) hypertension: Secondary | ICD-10-CM | POA: Diagnosis not present

## 2014-12-01 DIAGNOSIS — M8580 Other specified disorders of bone density and structure, unspecified site: Secondary | ICD-10-CM

## 2014-12-01 DIAGNOSIS — Z0000 Encounter for general adult medical examination without abnormal findings: Secondary | ICD-10-CM

## 2014-12-01 MED ORDER — LOSARTAN POTASSIUM-HCTZ 100-25 MG PO TABS: 1.0000 | ORAL_TABLET | Freq: Every day | ORAL | Status: DC

## 2014-12-01 NOTE — Progress Notes (Signed)
Subjective:    Patient ID: Elizabeth Wilkins, female    DOB: [birth year], 69 y.o.   MRN: [id number]  HPI   Here for medicare wellness  Diet: heart healthy  Physical activity: sedentary Depression/mood screen: negative Hearing: intact to whispered voice but feels "hollow" on L (see below re: cerumen finding) Visual acuity: grossly normal, performs annual eye exam  ADLs: capable Fall risk: none Home safety: good Cognitive evaluation: intact to orientation, naming, recall and repetition EOL planning: adv directives, full code/ I agree  I have personally reviewed and have noted 1. The patient's medical and social history 2. Their use of alcohol, tobacco or illicit drugs 3. Their current medications and supplements 4. The patient's functional ability including ADL's, fall risks, home safety risks and hearing or visual impairment. 5. Diet and physical activities 6. Evidence for depression or mood disorders  Also reviewed chronic medical issues, interval event and concerns  Past Medical History  Diagnosis Date  . Hypertension   . Hyperlipidemia   . Osteopenia    Family History  Problem Relation Age of Onset  . Cancer Mother     Pancreatic  . Hypertension Mother   . Hyperlipidemia Mother   . Pancreatic cancer Mother   . Lupus Father   . Colon cancer Neg Hx   . Stomach cancer Neg Hx    History  Substance Use Topics  . Smoking status: Never Smoker   . Smokeless tobacco: Never Used  . Alcohol Use: 12.6 oz/week    14 Glasses of wine, 7 Shots of liquor per week    Review of Systems  Constitutional: Negative for fatigue and unexpected weight change.  Respiratory: Negative for cough, shortness of breath and wheezing.   Cardiovascular: Negative for chest pain, palpitations and leg swelling.  Gastrointestinal: Negative for nausea, abdominal pain and diarrhea.  Neurological: Negative for dizziness, weakness, light-headedness and headaches.  Psychiatric/Behavioral: Negative  for dysphoric mood. The patient is not nervous/anxious.   All other systems reviewed and are negative.      Objective:    Physical Exam  Constitutional: She appears well-developed and well-nourished. No distress.  HENT:  L cerumen impaction; after irrigation, TM clear without effusion  Cardiovascular: Normal rate, regular rhythm and normal heart sounds.   No murmur heard. Pulmonary/Chest: Effort normal and breath sounds normal. No respiratory distress.  Musculoskeletal: She exhibits no edema.  Neurological: She is alert.  Psychiatric: She has a normal mood and affect. Her behavior is normal. Judgment and thought content normal.    BP 140/84 mmHg  Pulse 72  Temp(Src) 98.2 F (36.8 C) (Oral)  Resp 18  Wt 190 lb (86.183 kg)  SpO2 99% Wt Readings from Last 3 Encounters:  12/01/14 190 lb (86.183 kg)  05/02/14 186 lb 2 oz (84.426 kg)  08/02/13 184 lb (83.462 kg)     Lab Results  Component Value Date   WBC 4.1 11/10/2008   HGB 13.7 11/10/2008   HCT 39.5 11/10/2008   PLT 280 11/10/2008   GLUCOSE 91 07/24/2013   CHOL 202* 07/24/2013   TRIG 82.0 07/24/2013   HDL 52.40 07/24/2013   LDLDIRECT 140.3 07/24/2013   LDLCALC 101* 10/05/2011   ALT 26 03/08/2011   AST 22 03/08/2011   NA 140 07/24/2013   K 4.8 07/24/2013   CL 105 07/24/2013   CREATININE 0.7 07/24/2013   BUN 18 07/24/2013   CO2 28 07/24/2013   TSH 3.670 04/28/2008    No results found.  Procedure: wax  removal Reason: wax impaction Risks and benefits of procedure discussed with the patient who agrees to proceed. Ear(s) irrigated with warm water. Large amount of wax removed. Instrumentation with metal ear loop was performed to accomplish wax removal. the patient tolerated procedure well.      Assessment & Plan:   Z00.00/AWV - Today patient counseled on age appropriate routine health concerns for screening and prevention, each reviewed and up to date or declined. Immunizations reviewed and up to date or  declined. Labs ordered and reviewed. Risk factors for depression reviewed and negative. Hearing function and visual acuity are intact. ADLs screened and addressed as needed. Functional ability and level of safety reviewed and appropriate. Education, counseling and referrals performed based on assessed risks today. Patient provided with a copy of personalized plan for preventive services.  L cerumen impaction - irrigation today - see above  Problem List Items Addressed This Visit    Dyslipidemia    Started lipitor for same 10/2010, but self DC'd same due to myalgias Check annually and tx as needed The patient is asked to make an attempt to improve diet and exercise patterns to aid in medical management of this problem.      Relevant Orders   Lipid panel   Essential hypertension    Reports optho saw retinal changes 01/2011 (hecker) related to BP started ARB 01/2011, titrated up 03/2011 Maxed ARB and added hctz 04/2011 Temporarily stopped all meds 10/2012 due to low normal BP and dizziness, then resumed  working on diet and weight mgmt Reviewed element white coat  BP Readings from Last 3 Encounters:  12/01/14 140/84  05/02/14 175/103  08/02/13 149/101        Relevant Medications   losartan-hydrochlorothiazide (HYZAAR) 100-25 MG per tablet   Other Relevant Orders   Basic metabolic panel   Lipid panel   Osteopenia    Reports "stress bone reaction" L knee on recent ortho xray (no report available to me at this time) trial Fosamax 03/2011 - self dc'd due to shin pain -  At gyn, reports improved dexa 10/2011 - will reorder now Encouraged to continue Vit D and calcium Check Vit D with labs to monitor       Relevant Orders   DG Bone Density   Vit D  25 hydroxy (rtn osteoporosis monitoring)    Other Visit Diagnoses    Routine general medical examination at a health care facility    -  Primary    Screening for breast cancer        Relevant Orders    MM DIGITAL SCREENING BILATERAL     Cerumen impaction, left            Gwendolyn Grant, MD

## 2014-12-01 NOTE — Assessment & Plan Note (Signed)
Started lipitor for same 10/2010, but self DC'd same due to myalgias Check annually and tx as needed The patient is asked to make an attempt to improve diet and exercise patterns to aid in medical management of this problem.

## 2014-12-01 NOTE — Progress Notes (Signed)
Pre visit review using our clinic review tool, if applicable. No additional management support is needed unless otherwise documented below in the visit note. 

## 2014-12-01 NOTE — Patient Instructions (Addendum)
It was good to see you today.  We have reviewed your prior records including labs and tests today  We'll make referral to [facility] for your mammogram and bone density. We will call you with these appointments once scheduled  Health Maintenance reviewed - Prevnar 13, new or pneumonia vaccine, updated today. All other recommended immunizations and age-appropriate screenings are up-to-date.  Test(s) ordered today. Return this week when you are fasting . Your results will be released to [place] (or called to you) after review, usually within 72hours after test completion. If any changes need to be made, you will be notified at that same time.  Medications reviewed and updated, no changes recommended at this time.  Your ear has been irrigated of wax today -let us know if continued hearing problems persist for referral to audiologist and hearing testing  Please schedule followup in 12 months for annual exam and labs, call sooner if problems.   Health Maintenance Adopting a healthy lifestyle and getting preventive care can go a long way to promote health and wellness. Talk with your health care provider about what schedule of regular examinations is right for you. This is a good chance for you to check in with your provider about disease prevention and staying healthy. In between checkups, there are plenty of things you can do on your own. Experts have done a lot of research about which lifestyle changes and preventive measures are most likely to keep you healthy. Ask your health care provider for more information. WEIGHT AND DIET  Eat a healthy diet  Be sure to include plenty of vegetables, fruits, low-fat dairy products, and lean protein.  Do not eat a lot of foods high in solid fats, added sugars, or salt.  Get regular exercise. This is one of the most important things you can do for your health.  Most adults should exercise for at least 150 minutes each week. The exercise should increase your  heart rate and make you sweat (moderate-intensity exercise).  Most adults should also do strengthening exercises at least twice a week. This is in addition to the moderate-intensity exercise.  Maintain a healthy weight  Body mass index (BMI) is a measurement that can be used to identify possible weight problems. It estimates body fat based on height and weight. Your health care provider can help determine your BMI and help you achieve or maintain a healthy weight.  For females 23 years of age and older:   A BMI below 18.5 is considered underweight.  A BMI of 18.5 to 24.9 is normal.  A BMI of 25 to 29.9 is considered overweight.  A BMI of 30 and above is considered obese.  Watch levels of cholesterol and blood lipids  You should start having your blood tested for lipids and cholesterol at 70 years of age, then have this test every 5 years.  You may need to have your cholesterol levels checked more often if:  Your lipid or cholesterol levels are high.  You are older than 70 years of age.  You are at high risk for heart disease.  CANCER SCREENING   Lung Cancer  Lung cancer screening is recommended for adults 70-29 years old who are at high risk for lung cancer because of a history of smoking.  A yearly low-dose CT scan of the lungs is recommended for people who:  Currently smoke.  Have quit within the past 15 years.  Have at least a 30-pack-year history of smoking. A pack year is  smoking an average of one pack of cigarettes a day for 1 year.  Yearly screening should continue until it has been 15 years since you quit.  Yearly screening should stop if you develop a health problem that would prevent you from having lung cancer treatment.  Breast Cancer  Practice breast self-awareness. This means understanding how your breasts normally appear and feel.  It also means doing regular breast self-exams. Let your health care provider know about any changes, no matter how  small.  If you are in your 20s or 30s, you should have a clinical breast exam (CBE) by a health care provider every 1-3 years as part of a regular health exam.  If you are 70 or older, have a CBE every year. Also consider having a breast X-ray (mammogram) every year.  If you have a family history of breast cancer, talk to your health care provider about genetic screening.  If you are at high risk for breast cancer, talk to your health care provider about having an MRI and a mammogram every year.  Breast cancer gene (BRCA) assessment is recommended for women who have family members with BRCA-related cancers. BRCA-related cancers include:  Breast.  Ovarian.  Tubal.  Peritoneal cancers.  Results of the assessment will determine the need for genetic counseling and BRCA1 and BRCA2 testing. Cervical Cancer Routine pelvic examinations to screen for cervical cancer are no longer recommended for nonpregnant women who are considered low risk for cancer of the pelvic organs (ovaries, uterus, and vagina) and who do not have symptoms. A pelvic examination may be necessary if you have symptoms including those associated with pelvic infections. Ask your health care provider if a screening pelvic exam is right for you.   The Pap test is the screening test for cervical cancer for women who are considered at risk.  If you had a hysterectomy for a problem that was not cancer or a condition that could lead to cancer, then you no longer need Pap tests.  If you are older than 65 years, and you have had normal Pap tests for the past 10 years, you no longer need to have Pap tests.  If you have had past treatment for cervical cancer or a condition that could lead to cancer, you need Pap tests and screening for cancer for at least 20 years after your treatment.  If you no longer get a Pap test, assess your risk factors if they change (such as having a new sexual partner). This can affect whether you should  start being screened again.  Some women have medical problems that increase their chance of getting cervical cancer. If this is the case for you, your health care provider may recommend more frequent screening and Pap tests.  The human papillomavirus (HPV) test is another test that may be used for cervical cancer screening. The HPV test looks for the virus that can cause cell changes in the cervix. The cells collected during the Pap test can be tested for HPV.  The HPV test can be used to screen women 13 years of age and older. Getting tested for HPV can extend the interval between normal Pap tests from three to five years.  An HPV test also should be used to screen women of any age who have unclear Pap test results.  After 70 years of age, women should have HPV testing as often as Pap tests.  Colorectal Cancer  This type of cancer can be detected and often prevented.  Routine colorectal cancer screening usually begins at 70 years of age and continues through 70 years of age.  Your health care provider may recommend screening at an earlier age if you have risk factors for colon cancer.  Your health care provider may also recommend using home test kits to check for hidden blood in the stool.  A small camera at the end of a tube can be used to examine your colon directly (sigmoidoscopy or colonoscopy). This is done to check for the earliest forms of colorectal cancer.  Routine screening usually begins at age 70.  Direct examination of the colon should be repeated every 5-10 years through 70 years of age. However, you may need to be screened more often if early forms of precancerous polyps or small growths are found. Skin Cancer  Check your skin from head to toe regularly.  Tell your health care provider about any new moles or changes in moles, especially if there is a change in a mole's shape or color.  Also tell your health care provider if you have a mole that is larger than the size  of a pencil eraser.  Always use sunscreen. Apply sunscreen liberally and repeatedly throughout the day.  Protect yourself by wearing long sleeves, pants, a wide-brimmed hat, and sunglasses whenever you are outside. HEART DISEASE, DIABETES, AND HIGH BLOOD PRESSURE   Have your blood pressure checked at least every 1-2 years. High blood pressure causes heart disease and increases the risk of stroke.  If you are between 48 years and 41 years old, ask your health care provider if you should take aspirin to prevent strokes.  Have regular diabetes screenings. This involves taking a blood sample to check your fasting blood sugar level.  If you are at a normal weight and have a low risk for diabetes, have this test once every three years after 70 years of age.  If you are overweight and have a high risk for diabetes, consider being tested at a younger age or more often. PREVENTING INFECTION  Hepatitis B  If you have a higher risk for hepatitis B, you should be screened for this virus. You are considered at high risk for hepatitis B if:  You were born in a country where hepatitis B is common. Ask your health care provider which countries are considered high risk.  Your parents were born in a high-risk country, and you have not been immunized against hepatitis B (hepatitis B vaccine).  You have HIV or AIDS.  You use needles to inject street drugs.  You live with someone who has hepatitis B.  You have had sex with someone who has hepatitis B.  You get hemodialysis treatment.  You take certain medicines for conditions, including cancer, organ transplantation, and autoimmune conditions. Hepatitis C  Blood testing is recommended for:  Everyone born from [birth year] through 1966.  Anyone with known risk factors for hepatitis C. Sexually transmitted infections (STIs)  You should be screened for sexually transmitted infections (STIs) including gonorrhea and chlamydia if:  You are sexually  active and are younger than 70 years of age.  You are older than 70 years of age and your health care provider tells you that you are at risk for this type of infection.  Your sexual activity has changed since you were last screened and you are at an increased risk for chlamydia or gonorrhea. Ask your health care provider if you are at risk.  If you do not have HIV, but are  at risk, it may be recommended that you take a prescription medicine daily to prevent HIV infection. This is called pre-exposure prophylaxis (PrEP). You are considered at risk if:  You are sexually active and do not regularly use condoms or know the HIV status of your partner(s).  You take drugs by injection.  You are sexually active with a partner who has HIV. Talk with your health care provider about whether you are at high risk of being infected with HIV. If you choose to begin PrEP, you should first be tested for HIV. You should then be tested every 3 months for as long as you are taking PrEP.  PREGNANCY   If you are premenopausal and you may become pregnant, ask your health care provider about preconception counseling.  If you may become pregnant, take 400 to 800 micrograms (mcg) of folic acid every day.  If you want to prevent pregnancy, talk to your health care provider about birth control (contraception). OSTEOPOROSIS AND MENOPAUSE   Osteoporosis is a disease in which the bones lose minerals and strength with aging. This can result in serious bone fractures. Your risk for osteoporosis can be identified using a bone density scan.  If you are 51 years of age or older, or if you are at risk for osteoporosis and fractures, ask your health care provider if you should be screened.  Ask your health care provider whether you should take a calcium or vitamin D supplement to lower your risk for osteoporosis.  Menopause may have certain physical symptoms and risks.  Hormone replacement therapy may reduce some of these  symptoms and risks. Talk to your health care provider about whether hormone replacement therapy is right for you.  HOME CARE INSTRUCTIONS   Schedule regular health, dental, and eye exams.  Stay current with your immunizations.   Do not use any tobacco products including cigarettes, chewing tobacco, or electronic cigarettes.  If you are pregnant, do not drink alcohol.  If you are breastfeeding, limit how much and how often you drink alcohol.  Limit alcohol intake to no more than 1 drink per day for nonpregnant women. One drink equals 12 ounces of beer, 5 ounces of wine, or 1 ounces of hard liquor.  Do not use street drugs.  Do not share needles.  Ask your health care provider for help if you need support or information about quitting drugs.  Tell your health care provider if you often feel depressed.  Tell your health care provider if you have ever been abused or do not feel safe at home. Document Released: 03/28/2011 Document Revised: 01/27/2014 Document Reviewed: 08/14/2013 [facility] Patient Information 2015 [place], [place]. This information is not intended to replace advice given to you by your health care provider. Make sure you discuss any questions you have with your health care provider.

## 2014-12-01 NOTE — Assessment & Plan Note (Signed)
Reports "stress bone reaction" L knee on recent ortho xray (no report available to me at this time) trial Fosamax 03/2011 - self dc'd due to shin pain -  At gyn, reports improved dexa 10/2011 - will reorder now Encouraged to continue Vit D and calcium Check Vit D with labs to monitor

## 2014-12-01 NOTE — Assessment & Plan Note (Signed)
Reports optho saw retinal changes 01/2011 (hecker) related to BP started ARB 01/2011, titrated up 03/2011 Maxed ARB and added hctz 04/2011 Temporarily stopped all meds 10/2012 due to low normal BP and dizziness, then resumed  working on diet and weight mgmt Reviewed element white coat  BP Readings from Last 3 Encounters:  12/01/14 140/84  05/02/14 175/103  08/02/13 149/101

## 2014-12-03 ENCOUNTER — Telehealth: Payer: Self-pay | Admitting: Internal Medicine

## 2014-12-03 NOTE — Telephone Encounter (Signed)
emmi mailed  °

## 2014-12-04 DIAGNOSIS — L900 Lichen sclerosus et atrophicus: Secondary | ICD-10-CM | POA: Diagnosis not present

## 2014-12-05 ENCOUNTER — Other Ambulatory Visit (INDEPENDENT_AMBULATORY_CARE_PROVIDER_SITE_OTHER): Payer: Medicare Other

## 2014-12-05 DIAGNOSIS — E785 Hyperlipidemia, unspecified: Secondary | ICD-10-CM

## 2014-12-05 DIAGNOSIS — M8580 Other specified disorders of bone density and structure, unspecified site: Secondary | ICD-10-CM

## 2014-12-05 DIAGNOSIS — I10 Essential (primary) hypertension: Secondary | ICD-10-CM | POA: Diagnosis not present

## 2014-12-05 LAB — LIPID PANEL
CHOLESTEROL: 194 mg/dL (ref 0–200)
HDL: 51.6 mg/dL (ref >39.00)
LDL Cholesterol: 122 mg/dL — ABNORMAL HIGH (ref 0–99)
NONHDL: 142.4
Total CHOL/HDL Ratio: 4
Triglycerides: 103 mg/dL (ref 0.0–149.0)
VLDL: 20.6 mg/dL (ref 0.0–40.0)

## 2014-12-05 LAB — BASIC METABOLIC PANEL
BUN: 31 mg/dL — ABNORMAL HIGH (ref 6–23)
CALCIUM: 9.4 mg/dL (ref 8.4–10.5)
CO2: 29 meq/L (ref 19–32)
Chloride: 105 mEq/L (ref 96–112)
Creatinine, Ser: 1.13 mg/dL (ref 0.40–1.20)
GFR: 50.65 mL/min — AB (ref >60.00)
GLUCOSE: 92 mg/dL (ref 70–99)
POTASSIUM: 4.3 meq/L (ref 3.5–5.1)
Sodium: 140 mEq/L (ref 135–145)

## 2014-12-05 LAB — VITAMIN D 25 HYDROXY (VIT D DEFICIENCY, FRACTURES): VITD: 16.89 ng/mL — AB (ref 30.00–100.00)

## 2015-01-07 ENCOUNTER — Other Ambulatory Visit: Payer: Self-pay

## 2015-01-07 MED ORDER — LOSARTAN POTASSIUM-HCTZ 100-25 MG PO TABS: 1.0000 | ORAL_TABLET | Freq: Every day | ORAL | Status: DC

## 2015-01-16 DIAGNOSIS — Z8781 Personal history of (healed) traumatic fracture: Secondary | ICD-10-CM | POA: Diagnosis not present

## 2015-02-25 ENCOUNTER — Emergency Department (HOSPITAL_COMMUNITY)
Admission: EM | Admit: 2015-02-25 | Discharge: 2015-02-25 | Disposition: A | Discharge status: Home / Self Care | Payer: Medicare Other | Attending: Emergency Medicine | Admitting: Emergency Medicine

## 2015-02-25 ENCOUNTER — Telehealth: Payer: Self-pay | Admitting: Internal Medicine

## 2015-02-25 ENCOUNTER — Emergency Department (HOSPITAL_COMMUNITY): Payer: Medicare Other

## 2015-02-25 ENCOUNTER — Encounter (HOSPITAL_COMMUNITY): Payer: Self-pay | Admitting: *Deleted

## 2015-02-25 DIAGNOSIS — Z8639 Personal history of other endocrine, nutritional and metabolic disease: Secondary | ICD-10-CM | POA: Insufficient documentation

## 2015-02-25 DIAGNOSIS — Z7982 Long term (current) use of aspirin: Secondary | ICD-10-CM | POA: Insufficient documentation

## 2015-02-25 DIAGNOSIS — Z79899 Other long term (current) drug therapy: Secondary | ICD-10-CM | POA: Diagnosis not present

## 2015-02-25 DIAGNOSIS — Z8739 Personal history of other diseases of the musculoskeletal system and connective tissue: Secondary | ICD-10-CM | POA: Diagnosis not present

## 2015-02-25 DIAGNOSIS — R079 Chest pain, unspecified: Secondary | ICD-10-CM

## 2015-02-25 DIAGNOSIS — M25512 Pain in left shoulder: Secondary | ICD-10-CM | POA: Diagnosis not present

## 2015-02-25 DIAGNOSIS — I10 Essential (primary) hypertension: Secondary | ICD-10-CM | POA: Diagnosis not present

## 2015-02-25 DIAGNOSIS — M549 Dorsalgia, unspecified: Secondary | ICD-10-CM | POA: Diagnosis present

## 2015-02-25 LAB — BASIC METABOLIC PANEL
Anion gap: 10 (ref 5–15)
BUN: 20 mg/dL (ref 6–20)
CHLORIDE: 102 mmol/L (ref 101–111)
CO2: 26 mmol/L (ref 22–32)
CREATININE: 1.04 mg/dL — AB (ref 0.44–1.00)
Calcium: 9.5 mg/dL (ref 8.9–10.3)
GFR calc Af Amer: >60 mL/min (ref >60)
GFR, EST NON AFRICAN AMERICAN: 54 mL/min — AB (ref >60)
GLUCOSE: 141 mg/dL — AB (ref 65–99)
POTASSIUM: 3.9 mmol/L (ref 3.5–5.1)
Sodium: 138 mmol/L (ref 135–145)

## 2015-02-25 LAB — CBC
HEMATOCRIT: 37.7 % (ref 36.0–46.0)
Hemoglobin: 13 g/dL (ref 12.0–15.0)
MCH: 31.1 pg (ref 26.0–34.0)
MCHC: 34.5 g/dL (ref 30.0–36.0)
MCV: 90.2 fL (ref 78.0–100.0)
PLATELETS: 153 10*3/uL (ref 150–400)
RBC: 4.18 MIL/uL (ref 3.87–5.11)
RDW: 12.4 % (ref 11.5–15.5)
WBC: 3.9 10*3/uL — AB (ref 4.0–10.5)

## 2015-02-25 LAB — I-STAT TROPONIN, ED
TROPONIN I, POC: 0 ng/mL (ref 0.00–0.08)
Troponin i, poc: 0 ng/mL (ref 0.00–0.08)

## 2015-02-25 MED ORDER — TRAMADOL HCL 50 MG PO TABS: 50.0000 mg | ORAL_TABLET | Freq: Four times a day (QID) | ORAL | Status: DC | PRN

## 2015-02-25 NOTE — Telephone Encounter (Signed)
[place] [facility] - Client [place] [facility] Patient Name: VERONDA GABOR DOB: 06/02/1946 Initial Comment Caller States has pain in her right shoulder blade on the back. needs to know how to treat it. current BP 97/66 Nurse Assessment Nurse: Martyn Ehrich RN, Felicia Date/Time (Eastern Time): 02/25/2015 3:16:39 PM Confirm and document reason for call. If symptomatic, describe symptoms. ---L shoulder started hurting after she got up this am. No injury. BP is 97/66 noted just in last hour. She takes BP med and usually it is high on BP med Has the patient traveled out of the country within the last 30 days? ---No Does the patient require triage? ---Yes Related visit to physician within the last 2 weeks? ---No Does the PT have any chronic conditions? (i.e. diabetes, asthma, etc.) ---YesList chronic conditions. ---HTN Guidelines Guideline Title Affirmed Question Affirmed Notes Chest Pain [1] Chest pain lasts > 5 minutes AND [2] age > 45 Final Disposition User Call EMS 911 Now [place], RN, [place] Comments Cell phone no. [phone]

## 2015-02-25 NOTE — ED Notes (Signed)
Pt reports having left shoulder pain that began this morning when she was walking. Pt denies any known injury. Pt denies any radiation or associated symptoms.

## 2015-02-25 NOTE — Discharge Instructions (Signed)
Symptoms may very well be rhomboid muscle in nature. Would recommend starting a baby aspirin a day. Will give you a prescription for some tramadol which is a mild pain medicine. No medication is required however. Return for any new or worse symptoms. Make an appointment to follow-up with your regular doctor. Today's workup without any significant findings.

## 2015-02-25 NOTE — ED Notes (Signed)
Pt. Left with all belongings and refused wheelchair 

## 2015-02-25 NOTE — ED Provider Notes (Signed)
CSN: [id number]     Arrival date & time 02/25/15  1611 History   First MD Initiated Contact with Patient 02/25/15 1812     Chief Complaint  Patient presents with  . Back Pain     (Consider location/radiation/quality/duration/timing/severity/associated sxs/prior Treatment) Patient is a 69 y.o. female presenting with back pain. The history is provided by the patient.  Back Pain Associated symptoms: no abdominal pain, no chest pain, no dysuria, no fever and no headaches    patient with onset of discomfort to her left back area just medial to her shoulder blade. At the 8:30 this morning. No history of any injury or excessive use. Pain nonradiating. This been persistent. No shortness of breath no nausea no vomiting no arm pain no neck pain no anterior chest pain. No history of any cardiac disease however does have cardiac risk factors of hypertension and hyperlipidemia. Symptoms seem to be improved by stretching the shoulders forward. Pain is been persistent since it started. More of an ache type feeling currently 4 out of 10.  Past Medical History  Diagnosis Date  . Hypertension   . Hyperlipidemia   . Osteopenia    Past Surgical History  Procedure Laterality Date  . Tubal ligation    . Broken wrist    . Colonoscopy     Family History  Problem Relation Age of Onset  . Cancer Mother     Pancreatic  . Hypertension Mother   . Hyperlipidemia Mother   . Pancreatic cancer Mother   . Lupus Father   . Colon cancer Neg Hx   . Stomach cancer Neg Hx    History  Substance Use Topics  . Smoking status: Never Smoker   . Smokeless tobacco: Never Used  . Alcohol Use: 12.6 oz/week    14 Glasses of wine, 7 Shots of liquor per week   OB History    No data available     Review of Systems  Constitutional: Negative for fever.  HENT: Negative for congestion.   Eyes: Negative for visual disturbance.  Respiratory: Negative for shortness of breath.   Cardiovascular: Negative for chest pain.   Gastrointestinal: Negative for nausea and abdominal pain.  Genitourinary: Negative for dysuria.  Musculoskeletal: Positive for back pain. Negative for neck pain.  Skin: Negative for rash.  Neurological: Negative for headaches.  Hematological: Does not bruise/bleed easily.  Psychiatric/Behavioral: Negative for confusion.      Allergies  Morphine  Home Medications   Prior to Admission medications   Medication Sig Start Date End Date Taking? Authorizing Provider  aspirin EC 81 MG tablet Take 162 mg by mouth daily as needed for mild pain.   Yes Historical Provider, MD  losartan-hydrochlorothiazide (HYZAAR) 100-25 MG per tablet Take 1 tablet by mouth daily. 01/07/15  Yes Rowe Clack, MD  traMADol (ULTRAM) 50 MG tablet Take 1 tablet (50 mg total) by mouth every 6 (six) hours as needed. 02/25/15   Fredia Sorrow, MD   BP 155/68 mmHg  Pulse 53  Temp(Src) 97.9 F (36.6 C) (Oral)  Resp 16  Ht 5\' 9"  (1.753 m)  Wt 187 lb (84.823 kg)  BMI 27.60 kg/m2  SpO2 99% Physical Exam  Constitutional: She is oriented to person, place, and time. She appears well-developed and well-nourished. No distress.  HENT:  Head: Normocephalic and atraumatic.  Mouth/Throat: Oropharynx is clear and moist.  Eyes: Conjunctivae and EOM are normal. Pupils are equal, round, and reactive to light.  Neck: Normal range of motion. Neck  supple.  Cardiovascular: Normal rate, regular rhythm and normal heart sounds.   No murmur heard. Pulmonary/Chest: Effort normal and breath sounds normal. She exhibits no tenderness.  Abdominal: Soft. Bowel sounds are normal. There is no tenderness.  Musculoskeletal: Normal range of motion. She exhibits no edema.  Neurological: She is alert and oriented to person, place, and time. No cranial nerve deficit. She exhibits normal muscle tone. Coordination normal.  Skin: Skin is warm. No rash noted.  Nursing note and vitals reviewed.   ED Course  Procedures (including critical care  time) Labs Review Labs Reviewed  CBC - Abnormal; Notable for the following:    WBC 3.9 (*)    All other components within normal limits  BASIC METABOLIC PANEL - Abnormal; Notable for the following:    Glucose, Bld 141 (*)    Creatinine, Ser 1.04 (*)    GFR calc non Af Amer 54 (*)    All other components within normal limits  I-STAT TROPOININ, ED  Randolm Idol, ED    Imaging Review Dg Chest 2 View  02/25/2015   CLINICAL DATA:  Left shoulder and back pain starting this morning. No injury.  EXAM: CHEST - 2 VIEW  COMPARISON:  None available  FINDINGS: Lungs are clear. Heart size and mediastinal contours are within normal limits. No effusion.  No pneumothorax. Visualized skeletal structures are unremarkable. Peripherally calcified bilateral breast implants.  IMPRESSION: No acute cardiopulmonary disease.   Electronically Signed   By: Lucrezia Europe M.D.   On: 02/25/2015 19:51     EKG Interpretation   Date/Time:  Wednesday February 25 2015 16:21:21 EDT Ventricular Rate:  78 PR Interval:  152 QRS Duration: 82 QT Interval:  416 QTC Calculation: 474 R Axis:   -21 Text Interpretation:  Normal sinus rhythm Low voltage QRS Nonspecific ST  and T wave abnormality Abnormal ECG No previous tracing Confirmed by  Fermina Mishkin  MD, Kingson Lohmeyer ([id number]) on 02/25/2015 7:07:18 PM      MDM   Final diagnoses:  Chest pain    Patient's pain is really to the left back area around the rhomboid muscles. Workup for any cardiac event is been negative troponin times negative 2 chest x-rays negative EKG without acute changes. Symptoms started at 8:30 this morning. Highly unlikely that this is an acute cardiac event. Most likely a rhomboid muscle strain. Also no clinical concerns for pulmonary embolus. Patient is not hypoxic not tachycardic. Patient's past medical history is only really significant for hypertension hyperlipidemia which are risk factors for cardiac disease. Would recommend her starting a baby aspirin a day  and we'll treat the pain with tramadol have her follow-up with her regular doctor. Patient return for any new or worse symptoms. No leg swelling.    Fredia Sorrow, MD 02/25/15 2019

## 2015-03-03 ENCOUNTER — Telehealth: Payer: Self-pay | Admitting: *Deleted

## 2015-03-03 NOTE — Telephone Encounter (Signed)
[facility] - Client TELEPHONE ADVICE RECORD [facility] Patient Name: Elizabeth Wilkins Gender: Female DOB: 06/30/1946 Age: 68 Y 10 M 12 D Return Phone Number: [phone] (Primary) Address: City/State/Zip: [place] [address] Client [facility] - Client Client Site [facility] Day Physician Leschber, Valerie Contact Type Call Call Type Triage / Clinical Relationship To Patient Self Appointment Disposition EMR Appointment Not Necessary Info pasted into Epic Yes Return Phone Number [phone] (Primary) Chief Complaint Shoulder Injury Initial Comment Caller States has pain in her right shoulder blade on the back. needs to know how to treat it. current BP 97/66 PreDisposition InappropriateToAsk Nurse Assessment Nurse: Gaddy, RN, Felicia Date/Time (Eastern Time): 02/25/2015 3:16:39 PM Confirm and document reason for call. If symptomatic, describe symptoms. ---L shoulder started hurting after she got up this am. No injury. BP is 97/66 noted just in last hour. She takes BP med and usually it is high on BP med Has the patient traveled out of the country within the last 30 days? ---No Does the patient require triage? ---Yes Related visit to physician within the last 2 weeks? ---No Does the PT have any chronic conditions? (i.e. diabetes, asthma, etc.) ---Yes List chronic conditions. ---HTN Guidelines Guideline Title Affirmed Question Affirmed Notes Nurse Date/Time (Eastern Time) Chest Pain [1] Chest pain lasts > 5 minutes AND [2] age > 50 Gaddy, RN, Felicia 02/25/2015 3:19:10 PM Disp. Time (Eastern Time) Disposition Final User 02/25/2015 3:30:56 PM 911 Outcome Documentation Gaddy, RN, Felicia Reason: Pt declined to call 911. Husband is going to take her to ER 02/25/2015 3:31:35 PM Call Completed Gaddy, RN, Felicia 02/25/2015 3:23:02 PM Call EMS 911 Now Yes Gaddy, RN, Felicia PLEASE NOTE: All timestamps  contained within this report are represented as Eastern Standard Time. CONFIDENTIALTY NOTICE: This fax transmission is intended only for the addressee. It contains information that is legally privileged, confidential or otherwise protected from use or disclosure. If you are not the intended recipient, you are strictly prohibited from reviewing, disclosing, copying using or disseminating any of this information or taking any action in reliance on or regarding this information. If you have received this fax in error, please notify us immediately by telephone so that we can arrange for its return to us. Phone: [phone], Toll-Free: [phone], Fax: [phone] Page: 2 of 2 Call Id: [id number] Caller Understands: Yes Disagree/Comply: Comply Care Advice Given Per Guideline CALL EMS 911 NOW: Immediate medical attention is needed. You need to hang up and call 911 (or an ambulance). ([facility] Discretion: I'll call you back in a few minutes to be sure you were able to reach them.) After Care Instructions Given Call Event Type User Date / Time Description Comments User: Felicia, Gaddy, RN Date/Time (Eastern Time): 02/25/2015 3:22:53 PM Cell phone no. [phone] 

## 2015-03-10 DIAGNOSIS — M546 Pain in thoracic spine: Secondary | ICD-10-CM | POA: Diagnosis not present

## 2015-03-16 DIAGNOSIS — Z01419 Encounter for gynecological examination (general) (routine) without abnormal findings: Secondary | ICD-10-CM | POA: Diagnosis not present

## 2015-03-16 DIAGNOSIS — N951 Menopausal and female climacteric states: Secondary | ICD-10-CM | POA: Diagnosis not present

## 2015-03-16 DIAGNOSIS — L900 Lichen sclerosus et atrophicus: Secondary | ICD-10-CM | POA: Diagnosis not present

## 2015-03-27 DIAGNOSIS — M546 Pain in thoracic spine: Secondary | ICD-10-CM | POA: Diagnosis not present

## 2015-03-27 DIAGNOSIS — M542 Cervicalgia: Secondary | ICD-10-CM | POA: Diagnosis not present

## 2015-04-22 ENCOUNTER — Encounter: Payer: Self-pay | Admitting: Internal Medicine

## 2015-04-30 DIAGNOSIS — L308 Other specified dermatitis: Secondary | ICD-10-CM | POA: Diagnosis not present

## 2015-06-17 ENCOUNTER — Telehealth: Payer: Self-pay

## 2015-06-17 NOTE — Telephone Encounter (Signed)
Left message advising patient that mammogram is overdue 

## 2015-07-31 ENCOUNTER — Telehealth: Payer: Self-pay | Admitting: Internal Medicine

## 2015-07-31 NOTE — Telephone Encounter (Signed)
Pt call stated that Elizabeth Wilkins will fax over the bone density that she done back in April 2016 and she want to know the result of that. Please call her back as soon as we can bc she has been waiting for this result for a while.

## 2015-08-03 NOTE — Telephone Encounter (Signed)
Stefannie/cma has faxed copy and handling

## 2015-08-04 ENCOUNTER — Ambulatory Visit (INDEPENDENT_AMBULATORY_CARE_PROVIDER_SITE_OTHER): Payer: Medicare Other

## 2015-08-04 DIAGNOSIS — Z23 Encounter for immunization: Secondary | ICD-10-CM

## 2015-09-11 ENCOUNTER — Other Ambulatory Visit: Payer: Self-pay | Admitting: Internal Medicine

## 2015-11-05 DIAGNOSIS — L820 Inflamed seborrheic keratosis: Secondary | ICD-10-CM | POA: Diagnosis not present

## 2015-11-26 ENCOUNTER — Ambulatory Visit (INDEPENDENT_AMBULATORY_CARE_PROVIDER_SITE_OTHER): Payer: Medicare Other | Admitting: Internal Medicine

## 2015-11-26 ENCOUNTER — Encounter: Payer: Self-pay | Admitting: Internal Medicine

## 2015-11-26 VITALS — BP 140/90 | HR 105 | Temp 98.0°F | Resp 16 | Wt 191.0 lb

## 2015-11-26 DIAGNOSIS — Z0000 Encounter for general adult medical examination without abnormal findings: Secondary | ICD-10-CM | POA: Diagnosis not present

## 2015-11-26 DIAGNOSIS — E785 Hyperlipidemia, unspecified: Secondary | ICD-10-CM | POA: Diagnosis not present

## 2015-11-26 DIAGNOSIS — M8580 Other specified disorders of bone density and structure, unspecified site: Secondary | ICD-10-CM | POA: Diagnosis not present

## 2015-11-26 DIAGNOSIS — R739 Hyperglycemia, unspecified: Secondary | ICD-10-CM | POA: Diagnosis not present

## 2015-11-26 DIAGNOSIS — I10 Essential (primary) hypertension: Secondary | ICD-10-CM

## 2015-11-26 DIAGNOSIS — Z139 Encounter for screening, unspecified: Secondary | ICD-10-CM

## 2015-11-26 NOTE — Assessment & Plan Note (Signed)
Check lipid panel Was on Lipitor, but self DC'd due to myalgias and concerns for side effects Does not want to take any medication Encouraged regular exercise and healthy diet

## 2015-11-26 NOTE — Progress Notes (Signed)
Pre visit review using our clinic review tool, if applicable. No additional management support is needed unless otherwise documented below in the visit note. 

## 2015-11-26 NOTE — Progress Notes (Signed)
Subjective:    Patient ID: Elizabeth Wilkins, female    DOB: [birth year], 70 y.o.   MRN: [id number]  HPI She is here to establish with a new pcp.   Here for medicare wellness.   I have personally reviewed and have noted 1.The patient's medical and social history 2.Their use of alcohol, tobacco or illicit drugs 3.Their current medications and supplements 4.The patient's functional ability including ADL's, fall risks, home safety risks and                 hearing or visual impairment. 5.Diet and physical activities 6.Evidence for depression or mood disorders   Are there smokers in your home (other than you)? No  Risk Factors Exercise: none Dietary issues discussed: healthy diet  Cardiac risk factors: advanced age (older than 60 for men, 23 for women), hypertension  Depression Screen  Have you felt down, depressed or hopeless? No  Have you felt little interest or pleasure in doing things?  No  Activities of Daily Living In your present state of health, do you have any difficulty performing the following activities?:  Driving? Yes Managing money?  Yes Feeding yourself? Yes Getting from bed to chair? Yes Climbing a flight of stairs? Yes Preparing food and eating?: Yes Bathing or showering? Yes Getting dressed: Yes Getting to/using the toilet? Yes Moving around from place to place: Yes In the past year have you fallen or had a near fall?: no   Are you sexually active?  No  Do you have more than one partner?  N/A  Hearing Difficulties: No Do you often ask people to speak up or repeat themselves? No Do you experience ringing or noises in your ears? No Do you have difficulty understanding soft or whispered voices? No Vision:              Any change in vision:  No              Up to date with eye exam: yes Memory:  Do you feel that you have a problem with memory? No  Do you often misplace items? No  Do you feel safe  at home?  Yes  Cognitive Testing  Alert, Orientated? Yes  Normal Appearance? Yes  Recall of three objects?  Yes  Can perform simple calculations? Yes  Displays appropriate judgment? Yes  Can read the correct time from a watch face? Yes   Advanced Directives have been discussed with the patient? Yes, in place  Medications and allergies reviewed with patient and updated if appropriate.  Patient Active Problem List   Diagnosis Date Noted  . Seborrheic dermatitis 05/14/2012  . Osteopenia 11/18/2010  . Dyslipidemia 11/09/2010  . BURSITIS, RIGHT HIP 11/09/2010  . Essential hypertension 11/08/2010    Current Outpatient Prescriptions on File Prior to Visit  Medication Sig Dispense Refill  . aspirin EC 81 MG tablet Take 162 mg by mouth daily as needed for mild pain.    Marland Kitchen losartan-hydrochlorothiazide (HYZAAR) 100-25 MG tablet Take 1 tablet by mouth daily. Must establish with NEW PCP for additional refills. 30 tablet 0   No current facility-administered medications on file prior to visit.    Past Medical History  Diagnosis Date  . Hypertension   . Hyperlipidemia   . Osteopenia     Past Surgical History  Procedure Laterality Date  . Tubal ligation    . Broken wrist    . Colonoscopy      Social History   Social  History  . Marital Status: Married    Spouse Name: N/A  . Number of Children: N/A  . Years of Education: N/A   Social History Main Topics  . Smoking status: Never Smoker   . Smokeless tobacco: Never Used  . Alcohol Use: 12.6 oz/week    14 Glasses of wine, 7 Shots of liquor per week  . Drug Use: No  . Sexual Activity: Not Asked   Other Topics Concern  . 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      No regular exercise    Family History  Problem Relation Age of Onset  . Cancer Mother     Pancreatic  .  Hypertension Mother   . Hyperlipidemia Mother   . Pancreatic cancer Mother   . Lupus Father   . Colon cancer Neg Hx   . Stomach cancer Neg Hx     Review of Systems  Constitutional: Negative for fever, chills, appetite change and fatigue.  HENT: Positive for hearing loss (mild).   Eyes: Negative for visual disturbance.  Respiratory: Negative for cough, shortness of breath and wheezing.   Cardiovascular: Negative for chest pain, palpitations and leg swelling.  Gastrointestinal: Negative for nausea, abdominal pain, diarrhea, constipation and blood in stool.       No GERD  Genitourinary: Negative for dysuria and hematuria.  Musculoskeletal: Negative for back pain and arthralgias.  Neurological: Negative for dizziness, light-headedness and headaches.  Psychiatric/Behavioral: Positive for sleep disturbance (nightmares intermittently). Negative for dysphoric mood. The patient is not nervous/anxious.        Objective:   Filed Vitals:   11/26/15 0938  BP: 140/90  Pulse: 105  Temp: 98 F (36.7 C)  Resp: 16   Filed Weights   11/26/15 0938  Weight: 191 lb (86.637 kg)   Body mass index is 28.19 kg/(m^2).   Physical Exam Constitutional: She appears well-developed and well-nourished. No distress.  HENT:  Head: Normocephalic and atraumatic.  Right Ear: External ear normal. Normal ear canal and TM Left Ear: External ear normal.  Normal ear canal and TM Mouth/Throat: Oropharynx is clear and moist.  Normal bilateral ear canals and tympanic membranes  Eyes: Conjunctivae and EOM are normal.  Neck: Neck supple. No tracheal deviation present. No thyromegaly present.  No carotid bruit  Cardiovascular: Normal rate, regular rhythm and normal heart sounds.   1/6 systolic murmur.  No edema. Pulmonary/Chest: Effort normal and breath sounds normal. No respiratory distress. She has no wheezes. She has no rales.  Breast: deferred to Gyn Abdominal: Soft. She exhibits no distension. There is no  tenderness.  Lymphadenopathy: She has no cervical adenopathy.  Skin: Skin is warm and dry. She is not diaphoretic.  Psychiatric: She has a normal mood and affect. Her behavior is normal.       Assessment & Plan:   Wellness Exam Immunizations up to date EKG - done 2016 Colonoscopy up to date Mammogram not up to date -- advised that she schedules it  dexa - up to date Gyn up to date Eye exam up to date Hearing loss -- mild only, deferred audiology eval Memory concerns/difficulties - none Independent of ADLs - independent Consented to hepatitis C screening Not exercising regularly -- stressed regular exercise Encouraged weight loss She is consuming about 3 drinks a night and I advised that is  probably too much and ideally she should cut down.  With being slightly overweight and consuming too much alcohol she may be at increased risk of cirrhosis   See Problem List for Assessment and Plan of chronic medical problems.   Follow up annually

## 2015-11-26 NOTE — Assessment & Plan Note (Signed)
DEXA up-to-date-reviewed Encouraged vitamin D supplementation, high calcium diet Stressed the importance of regular exercise

## 2015-11-26 NOTE — Assessment & Plan Note (Signed)
Blood pressure elevated here I think she states she has mild whitecoat hypertension She does monitor at home occasionally and encouraged her to make sure it is well controlled at home Continue current medication Check CMP, TSH and basic blood work

## 2015-11-26 NOTE — Assessment & Plan Note (Signed)
Check A1c. 

## 2015-11-26 NOTE — Patient Instructions (Signed)
Elizabeth Wilkins , Thank you for taking time to come for your Medicare Wellness Visit. I appreciate your ongoing commitment to your health goals. Please review the following plan we discussed and let me know if I can assist you in the future.   These are the goals we discussed: Goals    Start regular exercise, schedule mammogram      This is a list of the screening recommended for you and due dates:  Health Maintenance  Topic Date Due  .  Hepatitis C: One time screening is recommended by Center for Disease Control  (CDC) for  adults born from [birth year] through 1965.   Aug 18, 1946  . Mammogram  11/23/2013  . Flu Shot  04/26/2016  . Tetanus Vaccine  11/08/2020  . Colon Cancer Screening  08/03/2023  . DEXA scan (bone density measurement)  Completed  . Shingles Vaccine  Addressed  . Pneumonia vaccines  Completed    Health Maintenance, Female Adopting a healthy lifestyle and getting preventive care can go a long way to promote health and wellness. Talk with your health care provider about what schedule of regular examinations is right for you. This is a good chance for you to check in with your provider about disease prevention and staying healthy. In between checkups, there are plenty of things you can do on your own. Experts have done a lot of research about which lifestyle changes and preventive measures are most likely to keep you healthy. Ask your health care provider for more information. WEIGHT AND DIET  Eat a healthy diet  Be sure to include plenty of vegetables, fruits, low-fat dairy products, and lean protein.  Do not eat a lot of foods high in solid fats, added sugars, or salt.  Get regular exercise. This is one of the most important things you can do for your health.  Most adults should exercise for at least 150 minutes each week. The exercise should increase your heart rate and make you sweat (moderate-intensity exercise).  Most adults should also do strengthening exercises at least  twice a week. This is in addition to the moderate-intensity exercise.  Maintain a healthy weight  Body mass index (BMI) is a measurement that can be used to identify possible weight problems. It estimates body fat based on height and weight. Your health care provider can help determine your BMI and help you achieve or maintain a healthy weight.  For females 71 years of age and older:   A BMI below 18.5 is considered underweight.  A BMI of 18.5 to 24.9 is normal.  A BMI of 25 to 29.9 is considered overweight.  A BMI of 30 and above is considered obese.  Watch levels of cholesterol and blood lipids  You should start having your blood tested for lipids and cholesterol at 71 years of age, then have this test every 5 years.  You may need to have your cholesterol levels checked more often if:  Your lipid or cholesterol levels are high.  You are older than 71 years of age.  You are at high risk for heart disease.  CANCER SCREENING   Lung Cancer  Lung cancer screening is recommended for adults 71-14 years old who are at high risk for lung cancer because of a history of smoking.  A yearly low-dose CT scan of the lungs is recommended for people who:  Currently smoke.  Have quit within the past 15 years.  Have at least a 30-pack-year history of smoking. A pack year  is smoking an average of one pack of cigarettes a day for 1 year.  Yearly screening should continue until it has been 15 years since you quit.  Yearly screening should stop if you develop a health problem that would prevent you from having lung cancer treatment.  Breast Cancer  Practice breast self-awareness. This means understanding how your breasts normally appear and feel.  It also means doing regular breast self-exams. Let your health care provider know about any changes, no matter how small.  If you are in your 71s or 30s, you should have a clinical breast exam (CBE) by a health care provider every 1-3 years  as part of a regular health exam.  If you are 71 or older, have a CBE every year. Also consider having a breast X-ray (mammogram) every year.  If you have a family history of breast cancer, talk to your health care provider about genetic screening.  If you are at high risk for breast cancer, talk to your health care provider about having an MRI and a mammogram every year.  Breast cancer gene (BRCA) assessment is recommended for women who have family members with BRCA-related cancers. BRCA-related cancers include:  Breast.  Ovarian.  Tubal.  Peritoneal cancers.  Results of the assessment will determine the need for genetic counseling and BRCA1 and BRCA2 testing. Cervical Cancer Your health care provider may recommend that you be screened regularly for cancer of the pelvic organs (ovaries, uterus, and vagina). This screening involves a pelvic examination, including checking for microscopic changes to the surface of your cervix (Pap test). You may be encouraged to have this screening done every 3 years, beginning at age 71.  For women ages 71-65, health care providers may recommend pelvic exams and Pap testing every 3 years, or they may recommend the Pap and pelvic exam, combined with testing for human papilloma virus (HPV), every 5 years. Some types of HPV increase your risk of cervical cancer. Testing for HPV may also be done on women of any age with unclear Pap test results.  Other health care providers may not recommend any screening for nonpregnant women who are considered low risk for pelvic cancer and who do not have symptoms. Ask your health care provider if a screening pelvic exam is right for you.  If you have had past treatment for cervical cancer or a condition that could lead to cancer, you need Pap tests and screening for cancer for at least 20 years after your treatment. If Pap tests have been discontinued, your risk factors (such as having a new sexual partner) need to be  reassessed to determine if screening should resume. Some women have medical problems that increase the chance of getting cervical cancer. In these cases, your health care provider may recommend more frequent screening and Pap tests. Colorectal Cancer  This type of cancer can be detected and often prevented.  Routine colorectal cancer screening usually begins at 71 years of age and continues through 71 years of age.  Your health care provider may recommend screening at an earlier age if you have risk factors for colon cancer.  Your health care provider may also recommend using home test kits to check for hidden blood in the stool.  A small camera at the end of a tube can be used to examine your colon directly (sigmoidoscopy or colonoscopy). This is done to check for the earliest forms of colorectal cancer.  Routine screening usually begins at age 26.  Direct examination of  the colon should be repeated every 5-10 years through 71 years of age. However, you may need to be screened more often if early forms of precancerous polyps or small growths are found. Skin Cancer  Check your skin from head to toe regularly.  Tell your health care provider about any new moles or changes in moles, especially if there is a change in a mole's shape or color.  Also tell your health care provider if you have a mole that is larger than the size of a pencil eraser.  Always use sunscreen. Apply sunscreen liberally and repeatedly throughout the day.  Protect yourself by wearing long sleeves, pants, a wide-brimmed hat, and sunglasses whenever you are outside. HEART DISEASE, DIABETES, AND HIGH BLOOD PRESSURE   High blood pressure causes heart disease and increases the risk of stroke. High blood pressure is more likely to develop in:  People who have blood pressure in the high end of the normal range (130-139/85-89 mm Hg).  People who are overweight or obese.  People who are African American.  If you are  31-32 years of age, have your blood pressure checked every 3-5 years. If you are 71 years of age or older, have your blood pressure checked every year. You should have your blood pressure measured twice--once when you are at a hospital or clinic, and once when you are not at a hospital or clinic. Record the average of the two measurements. To check your blood pressure when you are not at a hospital or clinic, you can use:  An automated blood pressure machine at a pharmacy.  A home blood pressure monitor.  If you are between 27 years and 90 years old, ask your health care provider if you should take aspirin to prevent strokes.  Have regular diabetes screenings. This involves taking a blood sample to check your fasting blood sugar level.  If you are at a normal weight and have a low risk for diabetes, have this test once every three years after 71 years of age.  If you are overweight and have a high risk for diabetes, consider being tested at a younger age or more often. PREVENTING INFECTION  Hepatitis B  If you have a higher risk for hepatitis B, you should be screened for this virus. You are considered at high risk for hepatitis B if:  You were born in a country where hepatitis B is common. Ask your health care provider which countries are considered high risk.  Your parents were born in a high-risk country, and you have not been immunized against hepatitis B (hepatitis B vaccine).  You have HIV or AIDS.  You use needles to inject street drugs.  You live with someone who has hepatitis B.  You have had sex with someone who has hepatitis B.  You get hemodialysis treatment.  You take certain medicines for conditions, including cancer, organ transplantation, and autoimmune conditions. Hepatitis C  Blood testing is recommended for:  Everyone born from [birth year] through 1965.  Anyone with known risk factors for hepatitis C. Sexually transmitted infections (STIs)  You should be screened  for sexually transmitted infections (STIs) including gonorrhea and chlamydia if:  You are sexually active and are younger than 71 years of age.  You are older than 71 years of age and your health care provider tells you that you are at risk for this type of infection.  Your sexual activity has changed since you were last screened and you are at an increased  risk for chlamydia or gonorrhea. Ask your health care provider if you are at risk.  If you do not have HIV, but are at risk, it may be recommended that you take a prescription medicine daily to prevent HIV infection. This is called pre-exposure prophylaxis (PrEP). You are considered at risk if:  You are sexually active and do not regularly use condoms or know the HIV status of your partner(s).  You take drugs by injection.  You are sexually active with a partner who has HIV. Talk with your health care provider about whether you are at high risk of being infected with HIV. If you choose to begin PrEP, you should first be tested for HIV. You should then be tested every 3 months for as long as you are taking PrEP.  PREGNANCY   If you are premenopausal and you may become pregnant, ask your health care provider about preconception counseling.  If you may become pregnant, take 400 to 800 micrograms (mcg) of folic acid every day.  If you want to prevent pregnancy, talk to your health care provider about birth control (contraception). OSTEOPOROSIS AND MENOPAUSE   Osteoporosis is a disease in which the bones lose minerals and strength with aging. This can result in serious bone fractures. Your risk for osteoporosis can be identified using a bone density scan.  If you are 83 years of age or older, or if you are at risk for osteoporosis and fractures, ask your health care provider if you should be screened.  Ask your health care provider whether you should take a calcium or vitamin D supplement to lower your risk for osteoporosis.  Menopause  may have certain physical symptoms and risks.  Hormone replacement therapy may reduce some of these symptoms and risks. Talk to your health care provider about whether hormone replacement therapy is right for you.  HOME CARE INSTRUCTIONS   Schedule regular health, dental, and eye exams.  Stay current with your immunizations.   Do not use any tobacco products including cigarettes, chewing tobacco, or electronic cigarettes.  If you are pregnant, do not drink alcohol.  If you are breastfeeding, limit how much and how often you drink alcohol.  Limit alcohol intake to no more than 1 drink per day for nonpregnant women. One drink equals 12 ounces of beer, 5 ounces of wine, or 1 ounces of hard liquor.  Do not use street drugs.  Do not share needles.  Ask your health care provider for help if you need support or information about quitting drugs.  Tell your health care provider if you often feel depressed.  Tell your health care provider if you have ever been abused or do not feel safe at home.   This information is not intended to replace advice given to you by your health care provider. Make sure you discuss any questions you have with your health care provider.   Document Released: 03/28/2011 Document Revised: 10/03/2014 Document Reviewed: 08/14/2013 Elsevier Interactive Patient Education [employer].

## 2015-12-01 ENCOUNTER — Other Ambulatory Visit (INDEPENDENT_AMBULATORY_CARE_PROVIDER_SITE_OTHER): Payer: Medicare Other

## 2015-12-01 DIAGNOSIS — I10 Essential (primary) hypertension: Secondary | ICD-10-CM | POA: Diagnosis not present

## 2015-12-01 DIAGNOSIS — E785 Hyperlipidemia, unspecified: Secondary | ICD-10-CM | POA: Diagnosis not present

## 2015-12-01 DIAGNOSIS — Z139 Encounter for screening, unspecified: Secondary | ICD-10-CM

## 2015-12-01 DIAGNOSIS — M8580 Other specified disorders of bone density and structure, unspecified site: Secondary | ICD-10-CM | POA: Diagnosis not present

## 2015-12-01 DIAGNOSIS — R739 Hyperglycemia, unspecified: Secondary | ICD-10-CM

## 2015-12-01 LAB — COMPREHENSIVE METABOLIC PANEL
ALBUMIN: 4.3 g/dL (ref 3.5–5.2)
ALK PHOS: 86 U/L (ref 39–117)
ALT: 18 U/L (ref 0–35)
AST: 20 U/L (ref 0–37)
BUN: 26 mg/dL — AB (ref 6–23)
CO2: 27 mEq/L (ref 19–32)
CREATININE: 1.08 mg/dL (ref 0.40–1.20)
Calcium: 9.7 mg/dL (ref 8.4–10.5)
Chloride: 106 mEq/L (ref 96–112)
GFR: 53.21 mL/min — ABNORMAL LOW (ref >60.00)
Glucose, Bld: 92 mg/dL (ref 70–99)
Potassium: 4.8 mEq/L (ref 3.5–5.1)
Sodium: 142 mEq/L (ref 135–145)
TOTAL PROTEIN: 6.9 g/dL (ref 6.0–8.3)
Total Bilirubin: 0.4 mg/dL (ref 0.2–1.2)

## 2015-12-01 LAB — CBC WITH DIFFERENTIAL/PLATELET
Basophils Absolute: 0 10*3/uL (ref 0.0–0.1)
Basophils Relative: 0.2 % (ref 0.0–3.0)
EOS ABS: 0 10*3/uL (ref 0.0–0.7)
Eosinophils Relative: 0 % (ref 0.0–5.0)
HCT: 37.5 % (ref 36.0–46.0)
HEMOGLOBIN: 13.1 g/dL (ref 12.0–15.0)
LYMPHS PCT: 35.7 % (ref 12.0–46.0)
Lymphs Abs: 1.3 10*3/uL (ref 0.7–4.0)
MCHC: 34.9 g/dL (ref 30.0–36.0)
MCV: 92.2 fl (ref 78.0–100.0)
MONO ABS: 0.3 10*3/uL (ref 0.1–1.0)
MONOS PCT: 8.9 % (ref 3.0–12.0)
Neutro Abs: 2 10*3/uL (ref 1.4–7.7)
Neutrophils Relative %: 55.2 % (ref 43.0–77.0)
Platelets: 223 10*3/uL (ref 150.0–400.0)
RBC: 4.06 Mil/uL (ref 3.87–5.11)
RDW: 13.1 % (ref 11.5–15.5)
WBC: 3.6 10*3/uL — AB (ref 4.0–10.5)

## 2015-12-01 LAB — LIPID PANEL
Cholesterol: 217 mg/dL — ABNORMAL HIGH (ref 0–200)
HDL: 59.7 mg/dL (ref >39.00)
LDL Cholesterol: 132 mg/dL — ABNORMAL HIGH (ref 0–99)
NONHDL: 157.6
Total CHOL/HDL Ratio: 4
Triglycerides: 126 mg/dL (ref 0.0–149.0)
VLDL: 25.2 mg/dL (ref 0.0–40.0)

## 2015-12-01 LAB — TSH: TSH: 3.83 u[IU]/mL (ref 0.35–4.50)

## 2015-12-01 LAB — HEMOGLOBIN A1C: HEMOGLOBIN A1C: 5.3 % (ref 4.6–6.5)

## 2015-12-02 LAB — HEPATITIS C ANTIBODY: HCV Ab: NEGATIVE

## 2015-12-03 ENCOUNTER — Encounter: Payer: Self-pay | Admitting: Internal Medicine

## 2015-12-07 DIAGNOSIS — S83412A Sprain of medial collateral ligament of left knee, initial encounter: Secondary | ICD-10-CM | POA: Diagnosis not present

## 2015-12-07 DIAGNOSIS — S8002XA Contusion of left knee, initial encounter: Secondary | ICD-10-CM | POA: Diagnosis not present

## 2015-12-21 DIAGNOSIS — S83412D Sprain of medial collateral ligament of left knee, subsequent encounter: Secondary | ICD-10-CM | POA: Diagnosis not present

## 2015-12-21 DIAGNOSIS — S8002XD Contusion of left knee, subsequent encounter: Secondary | ICD-10-CM | POA: Diagnosis not present

## 2016-01-07 DIAGNOSIS — H35373 Puckering of macula, bilateral: Secondary | ICD-10-CM | POA: Diagnosis not present

## 2016-01-07 DIAGNOSIS — H2513 Age-related nuclear cataract, bilateral: Secondary | ICD-10-CM | POA: Diagnosis not present

## 2016-01-07 DIAGNOSIS — H25013 Cortical age-related cataract, bilateral: Secondary | ICD-10-CM | POA: Diagnosis not present

## 2016-01-07 DIAGNOSIS — H35033 Hypertensive retinopathy, bilateral: Secondary | ICD-10-CM | POA: Diagnosis not present

## 2016-02-12 ENCOUNTER — Ambulatory Visit (INDEPENDENT_AMBULATORY_CARE_PROVIDER_SITE_OTHER): Payer: Medicare Other | Admitting: Internal Medicine

## 2016-02-12 ENCOUNTER — Telehealth: Payer: Self-pay | Admitting: Internal Medicine

## 2016-02-12 ENCOUNTER — Encounter: Payer: Self-pay | Admitting: Internal Medicine

## 2016-02-12 VITALS — BP 132/90 | HR 67 | Temp 98.2°F | Ht 69.0 in | Wt 187.5 lb

## 2016-02-12 DIAGNOSIS — K621 Rectal polyp: Secondary | ICD-10-CM | POA: Insufficient documentation

## 2016-02-12 NOTE — Telephone Encounter (Signed)
Dr. Silverio Decamp will you accept this patient?  Thanks!

## 2016-02-12 NOTE — Progress Notes (Signed)
Pre visit review using our clinic review tool, if applicable. No additional management support is needed unless otherwise documented below in the visit note. 

## 2016-02-12 NOTE — Telephone Encounter (Signed)
ok 

## 2016-02-12 NOTE — Telephone Encounter (Signed)
Sure no problem

## 2016-02-12 NOTE — Patient Instructions (Signed)
    A referral for GI was ordered - you will called to schedule an appointment.

## 2016-02-12 NOTE — Progress Notes (Signed)
Subjective:    Patient ID: Elizabeth Wilkins, female    DOB: 07-28-1946, 69 y.o.   MRN: [id number]  HPI She is here for an acute visit.    She has had a polyp on her rectal area for about 2 months.  She denies pain and bleeding and has never had hemorrhoids in the past.  It has not changed in size.  She denies changes in her bowel habits.  She denies rectal pain.   Her brother has colo-rectal cancer and it presented with rectal polyps that were not diagnosed as cancer for a while, that is why she wanted to come in.  Her last colonoscopy was in 2014 and was normal except mild diverticulosis.  There was no evidence of hemorrhoids at that time.    Medications and allergies reviewed with patient and updated if appropriate.  Patient Active Problem List   Diagnosis Date Noted  . Hyperglycemia 11/26/2015  . Seborrheic dermatitis 05/14/2012  . Osteopenia 11/18/2010  . Dyslipidemia 11/09/2010  . BURSITIS, RIGHT HIP 11/09/2010  . Essential hypertension 11/08/2010    Current Outpatient Prescriptions on File Prior to Visit  Medication Sig Dispense Refill  . aspirin EC 81 MG tablet Take 162 mg by mouth daily as needed for mild pain.    Marland Kitchen losartan-hydrochlorothiazide (HYZAAR) 100-25 MG tablet Take 1 tablet by mouth daily. Must establish with NEW PCP for additional refills. 30 tablet 0   No current facility-administered medications on file prior to visit.    Past Medical History  Diagnosis Date  . Hypertension   . Hyperlipidemia   . Osteopenia     Past Surgical History  Procedure Laterality Date  . Tubal ligation    . Broken wrist    . Colonoscopy      Social History   Social History  . Marital Status: Married    Spouse Name: N/A  . Number of Children: N/A  . Years of Education: N/A   Social History Main Topics  . Smoking status: Never Smoker   . Smokeless tobacco: Never Used  . Alcohol Use: 12.6 oz/week    14 Glasses of wine, 7 Shots of liquor per week  . Drug Use: No  .  Sexual Activity: Not Asked   Other Topics Concern  . 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      No regular exercise    Family History  Problem Relation Age of Onset  . Cancer Mother     Pancreatic  . Hypertension Mother   . Hyperlipidemia Mother   . Pancreatic cancer Mother   . Lupus Father   . Colon cancer Neg Hx   . Stomach cancer Neg Hx   . Rectal cancer Brother     Review of Systems See HPI    Objective:   Filed Vitals:   02/12/16 0911  BP: 132/90  Pulse: 67  Temp: 98.2 F (36.8 C)   Filed Weights   02/12/16 0911  Weight: 187 lb 8 oz (85.049 kg)   Body mass index is 27.68 kg/(m^2).   Physical Exam  Constitutional: She appears well-developed and well-nourished. No distress.  Genitourinary:  Rectal tone normal.  Pea sized thrombosed hemorrhoid vs polyp on right side that is non-tender.  Normal color.  DRE not performed  Skin: She is not diaphoretic.  Assessment & Plan:   See Problem List for Assessment and Plan of chronic medical problems.

## 2016-02-12 NOTE — Assessment & Plan Note (Signed)
Non-tender polyp vs hemorrhoid, present for about 2 months Brother had rectal polyps that were later found out to be cancer Will refer to GI for further evaluation Discussed symptomatic treatment if it becomes painful  Discussed with her that she will need a repeat colonoscopy after 5 years given her new family history - due 2019

## 2016-02-15 NOTE — Telephone Encounter (Signed)
LM ON VMAIL TO CALL BACK TO SCHEDULE AN APPT

## 2016-02-15 NOTE — Telephone Encounter (Signed)
Appointment with Dr. Silverio Decamp has been scheduled for July. Patient is requesting a callback from a nurse. [phone].

## 2016-02-15 NOTE — Telephone Encounter (Signed)
I have left the patient a message to call back.

## 2016-02-16 NOTE — Telephone Encounter (Signed)
Appt scheduled for 02/29/16. Patient agrees to this appointment.

## 2016-02-23 ENCOUNTER — Telehealth: Payer: Self-pay | Admitting: Gastroenterology

## 2016-02-23 ENCOUNTER — Telehealth: Payer: Self-pay | Admitting: Internal Medicine

## 2016-02-23 DIAGNOSIS — R350 Frequency of micturition: Secondary | ICD-10-CM | POA: Diagnosis not present

## 2016-02-23 NOTE — Telephone Encounter (Signed)
No answer

## 2016-02-23 NOTE — Telephone Encounter (Signed)
Patient is requesting call back.  Would like to know if biopsy can be moved up with Dr. Quay Burow assistance because she is having complications.

## 2016-02-23 NOTE — Telephone Encounter (Signed)
Patient reports that she already spoke to someone from our office and all of her questions were answered. She thanked me for the call

## 2016-02-24 NOTE — Telephone Encounter (Signed)
Spoke with pt to clarify. Has an appt on 02/29/16 for eval.

## 2016-02-24 NOTE — Telephone Encounter (Signed)
LVM for pt to call back.

## 2016-02-29 ENCOUNTER — Encounter: Payer: Self-pay | Admitting: Gastroenterology

## 2016-02-29 ENCOUNTER — Ambulatory Visit (INDEPENDENT_AMBULATORY_CARE_PROVIDER_SITE_OTHER): Payer: Medicare Other | Admitting: Gastroenterology

## 2016-02-29 VITALS — BP 130/74 | HR 76 | Ht 70.0 in | Wt 184.0 lb

## 2016-02-29 DIAGNOSIS — K645 Perianal venous thrombosis: Secondary | ICD-10-CM | POA: Diagnosis not present

## 2016-02-29 DIAGNOSIS — Z800 Family history of malignant neoplasm of digestive organs: Secondary | ICD-10-CM | POA: Diagnosis not present

## 2016-02-29 DIAGNOSIS — K648 Other hemorrhoids: Secondary | ICD-10-CM

## 2016-02-29 MED ORDER — HYDROCORTISONE 2.5 % RE CREA: 1.0000 "application " | TOPICAL_CREAM | Freq: Two times a day (BID) | RECTAL | Status: DC

## 2016-02-29 NOTE — Patient Instructions (Signed)
We have sent the following medications to your pharmacy for you to pick up at your convenience:  Anusol cream

## 2016-03-03 NOTE — Progress Notes (Signed)
Elizabeth Wilkins    [id number]    09-Aug-1946  Primary Care Physician:Stacy Lorretta Harp, MD  Referring Physician: Binnie Rail, MD [place], [place] [address]  Chief complaint:  Rectal nodule   HPI:  70 year old female previously followed by Dr. Carlean Purl is here for evaluation with complaints of rectal nodule that she has noticed in the past few weeks. Her younger brother recently got diagnosed with rectal cancer at age 76 and she is concerned about possible malignancy. Denies any pain or blood per rectum. No change in bowel habits. Her last colonoscopy in November 2014 with excellent prep with findings of sigmoid diverticulosis and was normal exam otherwise, prior to this she had a normal colonoscopy in 2004.   Outpatient Encounter Prescriptions as of 02/29/2016  Medication Sig  . clobetasol (TEMOVATE) 0.05 % external solution APPLY TO AFFECTED AREA ON SCALP ONCE DAILY AS NEEDED  . losartan-hydrochlorothiazide (HYZAAR) 100-25 MG tablet Take 1 tablet by mouth daily. Must establish with NEW PCP for additional refills.  . valACYclovir (VALTREX) 1000 MG tablet Take 1,000 mg by mouth as needed (cold sores). Reported on 02/12/2016  . hydrocortisone (ANUSOL-HC) 2.5 % rectal cream Place 1 application rectally 2 (two) times daily.  . [DISCONTINUED] aspirin EC 81 MG tablet Take 162 mg by mouth daily as needed for mild pain.   No facility-administered encounter medications on file as of 02/29/2016.    Allergies as of 02/29/2016 - Review Complete 02/29/2016  Allergen Reaction Noted  . Morphine Itching     Past Medical History  Diagnosis Date  . Hypertension   . Hyperlipidemia   . Osteopenia     Past Surgical History  Procedure Laterality Date  . Tubal ligation    . Broken wrist Right     plates and screws  . Colonoscopy      Family History  Problem Relation Age of Onset  . Pancreatic cancer Mother 79  . Hypertension Mother   . Hyperlipidemia Mother   . Lupus  Father   . Colon cancer Neg Hx   . Stomach cancer Neg Hx   . Rectal cancer Brother 39    Social History   Social History  . Marital Status: Married    Spouse Name: N/A  . Number of Children: 3  . Years of Education: N/A   Occupational History  . retired    Social History Main Topics  . Smoking status: Never Smoker   . Smokeless tobacco: Never Used  . Alcohol Use: 12.6 oz/week    14 Glasses of wine, 7 Shots of liquor per week  . Drug Use: No  . Sexual Activity: Not on file   Other Topics Concern  . 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      No regular exercise      Review of systems: Review of Systems  Constitutional: Negative for fever and chills.  HENT: Negative.   Eyes: Negative for blurred vision.  Respiratory: Negative for cough, shortness of breath and wheezing.   Cardiovascular: Negative for chest pain and palpitations.  Gastrointestinal: as per HPI Genitourinary: Negative for dysuria, urgency, frequency and hematuria.  Musculoskeletal: Negative for myalgias, back pain and joint pain.  Skin: Negative for itching and rash.  Neurological: Negative  for dizziness, tremors, focal weakness, seizures and loss of consciousness.  Endo/Heme/Allergies: Negative for environmental allergies.  Psychiatric/Behavioral: Negative for depression, suicidal ideas and hallucinations.  All other systems reviewed and are negative.   Physical Exam: Filed Vitals:   02/29/16 1554  BP: 130/74  Pulse: 76   Gen:      No acute distress HEENT:  EOMI, sclera anicteric Neck:     No masses; no thyromegaly Lungs:    Clear to auscultation bilaterally; normal respiratory effort CV:         Regular rate and rhythm; no murmurs Abd:      + bowel sounds; soft, non-tender; no palpable masses, no distension Ext:    No edema; adequate  peripheral perfusion Skin:      Warm and dry; no rash Neuro: alert and oriented x 3 Psych: normal mood and affect Rectal exam: Normal anal sphincter tone, no anal fissure, thrombosed external hemorrhoid, no nodule Anoscopy: Grade 2 internal hemorrhoids, no active bleeding, normal dentate line, no visible nodules  Data Reviewed: Reviewed chart in epic   Assessment and Plan/Recommendations:  70 year old female with family history of colon cancer here for evaluation of rectal nodule; on exam it appears to be thrombosed external hemorrhoid and no visible anal nodules Reassured patient and advised her to do sitz baths 3 times a day Apply Anusol cream twice daily as needed She is due for recall colonoscopy in 2019 Return as needed  K. Denzil Magnuson , MD [phone] Mon-Fri 8a-5p [phone] after 5p, weekends, holidays  CC: Binnie Rail, MD

## 2016-03-09 DIAGNOSIS — H5203 Hypermetropia, bilateral: Secondary | ICD-10-CM | POA: Diagnosis not present

## 2016-03-09 DIAGNOSIS — H04123 Dry eye syndrome of bilateral lacrimal glands: Secondary | ICD-10-CM | POA: Diagnosis not present

## 2016-03-09 DIAGNOSIS — H179 Unspecified corneal scar and opacity: Secondary | ICD-10-CM | POA: Diagnosis not present

## 2016-04-06 DIAGNOSIS — Z01419 Encounter for gynecological examination (general) (routine) without abnormal findings: Secondary | ICD-10-CM | POA: Diagnosis not present

## 2016-04-06 DIAGNOSIS — N3941 Urge incontinence: Secondary | ICD-10-CM | POA: Diagnosis not present

## 2016-04-06 DIAGNOSIS — Z6827 Body mass index (BMI) 27.0-27.9, adult: Secondary | ICD-10-CM | POA: Diagnosis not present

## 2016-04-06 DIAGNOSIS — N951 Menopausal and female climacteric states: Secondary | ICD-10-CM | POA: Diagnosis not present

## 2016-04-06 DIAGNOSIS — N905 Atrophy of vulva: Secondary | ICD-10-CM | POA: Diagnosis not present

## 2016-04-18 ENCOUNTER — Ambulatory Visit: Payer: Medicare Other | Admitting: Gastroenterology

## 2016-04-29 DIAGNOSIS — H00022 Hordeolum internum right lower eyelid: Secondary | ICD-10-CM | POA: Diagnosis not present

## 2016-05-19 DIAGNOSIS — L509 Urticaria, unspecified: Secondary | ICD-10-CM | POA: Diagnosis not present

## 2016-05-19 DIAGNOSIS — D1801 Hemangioma of skin and subcutaneous tissue: Secondary | ICD-10-CM | POA: Diagnosis not present

## 2016-05-19 DIAGNOSIS — D485 Neoplasm of uncertain behavior of skin: Secondary | ICD-10-CM | POA: Diagnosis not present

## 2016-05-19 DIAGNOSIS — L814 Other melanin hyperpigmentation: Secondary | ICD-10-CM | POA: Diagnosis not present

## 2016-05-19 DIAGNOSIS — L821 Other seborrheic keratosis: Secondary | ICD-10-CM | POA: Diagnosis not present

## 2016-05-19 DIAGNOSIS — C44319 Basal cell carcinoma of skin of other parts of face: Secondary | ICD-10-CM | POA: Diagnosis not present

## 2016-05-25 DIAGNOSIS — H0012 Chalazion right lower eyelid: Secondary | ICD-10-CM | POA: Diagnosis not present

## 2016-05-25 DIAGNOSIS — H01003 Unspecified blepharitis right eye, unspecified eyelid: Secondary | ICD-10-CM | POA: Diagnosis not present

## 2016-06-22 DIAGNOSIS — H01003 Unspecified blepharitis right eye, unspecified eyelid: Secondary | ICD-10-CM | POA: Diagnosis not present

## 2016-06-22 DIAGNOSIS — H179 Unspecified corneal scar and opacity: Secondary | ICD-10-CM | POA: Diagnosis not present

## 2016-06-22 DIAGNOSIS — H04123 Dry eye syndrome of bilateral lacrimal glands: Secondary | ICD-10-CM | POA: Diagnosis not present

## 2016-06-22 DIAGNOSIS — H0012 Chalazion right lower eyelid: Secondary | ICD-10-CM | POA: Diagnosis not present

## 2016-07-23 ENCOUNTER — Ambulatory Visit (INDEPENDENT_AMBULATORY_CARE_PROVIDER_SITE_OTHER): Payer: Medicare Other

## 2016-07-23 DIAGNOSIS — Z23 Encounter for immunization: Secondary | ICD-10-CM | POA: Diagnosis not present

## 2016-09-06 DIAGNOSIS — L905 Scar conditions and fibrosis of skin: Secondary | ICD-10-CM | POA: Diagnosis not present

## 2016-09-06 DIAGNOSIS — D485 Neoplasm of uncertain behavior of skin: Secondary | ICD-10-CM | POA: Diagnosis not present

## 2016-09-06 DIAGNOSIS — D2239 Melanocytic nevi of other parts of face: Secondary | ICD-10-CM | POA: Diagnosis not present

## 2017-01-04 ENCOUNTER — Telehealth: Payer: Self-pay | Admitting: Internal Medicine

## 2017-01-04 NOTE — Telephone Encounter (Signed)
Called patient to schedule awv. Lvm for patient to call office to schedule appt.  °

## 2017-01-28 ENCOUNTER — Emergency Department (HOSPITAL_COMMUNITY): Payer: Medicare Other

## 2017-01-28 ENCOUNTER — Emergency Department (HOSPITAL_COMMUNITY)
Admission: EM | Admit: 2017-01-28 | Discharge: 2017-01-28 | Disposition: A | Discharge status: Home / Self Care | Payer: Medicare Other | Attending: Emergency Medicine | Admitting: Emergency Medicine

## 2017-01-28 ENCOUNTER — Encounter (HOSPITAL_COMMUNITY): Payer: Self-pay | Admitting: Emergency Medicine

## 2017-01-28 DIAGNOSIS — Y939 Activity, unspecified: Secondary | ICD-10-CM | POA: Insufficient documentation

## 2017-01-28 DIAGNOSIS — Y929 Unspecified place or not applicable: Secondary | ICD-10-CM | POA: Diagnosis not present

## 2017-01-28 DIAGNOSIS — Y999 Unspecified external cause status: Secondary | ICD-10-CM | POA: Diagnosis not present

## 2017-01-28 DIAGNOSIS — Z23 Encounter for immunization: Secondary | ICD-10-CM | POA: Diagnosis not present

## 2017-01-28 DIAGNOSIS — I10 Essential (primary) hypertension: Secondary | ICD-10-CM | POA: Diagnosis not present

## 2017-01-28 DIAGNOSIS — S99912A Unspecified injury of left ankle, initial encounter: Secondary | ICD-10-CM | POA: Diagnosis not present

## 2017-01-28 DIAGNOSIS — W19XXXA Unspecified fall, initial encounter: Secondary | ICD-10-CM

## 2017-01-28 DIAGNOSIS — S51012A Laceration without foreign body of left elbow, initial encounter: Secondary | ICD-10-CM | POA: Diagnosis not present

## 2017-01-28 DIAGNOSIS — M25522 Pain in left elbow: Secondary | ICD-10-CM | POA: Diagnosis not present

## 2017-01-28 DIAGNOSIS — W108XXA Fall (on) (from) other stairs and steps, initial encounter: Secondary | ICD-10-CM | POA: Insufficient documentation

## 2017-01-28 MED ORDER — TETANUS-DIPHTH-ACELL PERTUSSIS 5-2.5-18.5 LF-MCG/0.5 IM SUSP
0.5000 mL | Freq: Once | INTRAMUSCULAR | Status: AC
  Administered 2017-01-28: 0.5 mL via INTRAMUSCULAR

## 2017-01-28 MED ORDER — LIDOCAINE HCL (PF) 1 % IJ SOLN
5.0000 mL | Freq: Once | INTRAMUSCULAR | Status: AC
  Administered 2017-01-28: 5 mL

## 2017-01-28 NOTE — ED Triage Notes (Signed)
Tripped over shoes going up steps.  Pain and wound in left elbow.

## 2017-01-28 NOTE — Discharge Instructions (Signed)
Sutures out in 10 days

## 2017-01-28 NOTE — ED Notes (Signed)
Pt bleeding from L elbow at nurse first. Bandage applied

## 2017-01-28 NOTE — ED Notes (Signed)
Pt fell on her patio and lacerated her lt elbow  Minimal bleeding.  approx 1-2" in length  Laceration cleaned with soap and water

## 2017-01-28 NOTE — ED Provider Notes (Signed)
[facility] DEPT Provider Note   CSN: [id number] Arrival date & time: 01/28/17  2048     History   Chief Complaint Chief Complaint  Patient presents with  . Extremity Laceration    HPI Elizabeth Wilkins is a 71 y.o. female.  HPI Patient had a mechanical fall was evening. Tripped while coming back on some stairs. Pain only left elbow with laceration. States she did however drop a glass. Also abrasion to left fifth finger and abrasion to further up the forearm. No numbness or weakness. Unknown last tetanus. No shoulder pain or numbness or weakness. No headache. No neck pain. She is not on anticoagulation. Past Medical History:  Diagnosis Date  . Hyperlipidemia   . Hypertension   . Osteopenia     Patient Active Problem List   Diagnosis Date Noted  . Rectal polyp 02/12/2016  . Hyperglycemia 11/26/2015  . Seborrheic dermatitis 05/14/2012  . Osteopenia 11/18/2010  . Dyslipidemia 11/09/2010  . BURSITIS, RIGHT HIP 11/09/2010  . Essential hypertension 11/08/2010    Past Surgical History:  Procedure Laterality Date  . broken wrist Right    plates and screws  . COLONOSCOPY    . TUBAL LIGATION      OB History    No data available       Home Medications    Prior to Admission medications   Medication Sig Start Date End Date Taking? Authorizing Provider  clobetasol (TEMOVATE) 0.05 % external solution APPLY TO AFFECTED AREA ON SCALP ONCE DAILY AS NEEDED 02/02/16  Yes [provider]  valACYclovir (VALTREX) 1000 MG tablet Take 1,000 mg by mouth as needed (cold sores). Reported on 02/12/2016 01/07/16  Yes [provider]  hydrocortisone (ANUSOL-HC) 2.5 % rectal cream Place 1 application rectally 2 (two) times daily. Patient not taking: Reported on 01/28/2017 02/29/16   Mauri Pole, MD  losartan-hydrochlorothiazide (HYZAAR) 100-25 MG tablet Take 1 tablet by mouth daily. Must establish with NEW PCP for additional refills. Patient not taking: Reported on  01/28/2017 09/11/15   Rowe Clack, MD    Family History Family History  Problem Relation Age of Onset  . Pancreatic cancer Mother 20  . Hypertension Mother   . Hyperlipidemia Mother   . Rectal cancer Brother 33  . Lupus Father   . Colon cancer Neg Hx   . Stomach cancer Neg Hx     Social History Social History  Substance Use Topics  . Smoking status: Never Smoker  . Smokeless tobacco: Never Used  . Alcohol use 12.6 oz/week    14 Glasses of wine, 7 Shots of liquor per week     Allergies   Morphine   Review of Systems Review of Systems  Constitutional: Negative for appetite change and fever.  Cardiovascular: Negative for chest pain.  Genitourinary: Negative for flank pain.  Musculoskeletal: Negative for back pain.  Skin: Positive for wound.  Neurological: Negative for numbness.     Physical Exam Updated Vital Signs BP (!) 161/88 (BP Location: Right Arm)   Pulse 70   Temp 98.1 F (36.7 C) (Oral)   Resp 18   Ht 5\' 10"  (1.778 m)   Wt 185 lb (83.9 kg)   SpO2 99%   BMI 26.54 kg/m   Physical Exam  Constitutional: She appears well-developed and well-nourished.  HENT:  Head: Atraumatic.  Neck: Neck supple.  Cardiovascular: Normal rate.   Pulmonary/Chest: Effort normal. She exhibits no tenderness.  Abdominal: There is no tenderness.  Musculoskeletal:  Left elbow  has a curved 2 cm laceration. No underlying bony tenderness. Good range of motion in elbow. Neurovascular intact bilateral hands. No tenderness over shoulder. Small superficial abrasion along left forearm and also abrasion along proximal phalanx of left third finger. Good range of motion in hand. Sensation intact  Skin: Skin is warm.     ED Treatments / Results  Labs (all labs ordered are listed, but only abnormal results are displayed) Labs Reviewed - No data to display  EKG  EKG Interpretation None       Radiology No results found.  Procedures Procedures (including critical care  time)  Medications Ordered in ED Medications  Tdap (BOOSTRIX) injection 0.5 mL (not administered)  lidocaine (PF) (XYLOCAINE) 1 % injection 5 mL (not administered)     Initial Impression / Assessment and Plan / ED Course  I have reviewed the triage vital signs and the nursing notes.  Pertinent labs & imaging results that were available during my care of the patient were reviewed by me and considered in my medical decision making (see chart for details).     Patient with finger laceration. Closed. X-ray done however not able to be viewed at this time due to computer outage. Discussed with patient. She will follow-up on an as an outpatient. I have low suspicion for fracture and low suspicion for foreign body. Discharge home after wound closure.  LACERATION REPAIR Performed by: Mackie Pai Authorized by: Mackie Pai Consent: Verbal consent obtained. Risks and benefits: risks, benefits and alternatives were discussed Consent given by: patient Patient identity confirmed: provided demographic data Prepped and Draped in normal sterile fashion Wound explored  Laceration Location: left elbow  Laceration Length: 2cm  No Foreign Bodies seen or palpated  Anesthesia: local infiltration  Local anesthetic: lidocaine 1%   Anesthetic total: 1.5 ml  Irrigation method: syringe Amount of cleaning: standard  Skin closure:4-0 prolene  Number of sutures: 6  Technique: simple interupted  Patient tolerance: Patient tolerated the procedure well with no immediate complications.  Final Clinical Impressions(s) / ED Diagnoses   Final diagnoses:  Fall, initial encounter  Laceration of left elbow, initial encounter    New Prescriptions New Prescriptions   No medications on file     Davonna Belling, MD 01/28/17 2237

## 2017-01-28 NOTE — ED Notes (Signed)
Gone to xray  

## 2017-01-31 ENCOUNTER — Telehealth: Payer: Self-pay | Admitting: Internal Medicine

## 2017-01-31 NOTE — Telephone Encounter (Signed)
LVM informing pt of MDs response.  

## 2017-01-31 NOTE — Telephone Encounter (Signed)
Xray shows:   IMPRESSION: Negative fracture, dislocation or radiopaque foreign body.      Glass is not radiopaque so glass may not show up

## 2017-01-31 NOTE — Telephone Encounter (Signed)
Pt would like to see if Dr burns would be about to what the results where of the xrays she had in the er back on the 5th to see if there were any glass left in her wound?   Something happen to their computer system that night and she was never able to find out

## 2017-02-07 ENCOUNTER — Ambulatory Visit: Payer: Medicare Other | Admitting: Family

## 2017-02-07 ENCOUNTER — Telehealth: Payer: Self-pay | Admitting: Internal Medicine

## 2017-02-07 NOTE — Telephone Encounter (Signed)
Patient no showed for stitch removal appt today with Marya Amsler.  Please advise.

## 2017-02-07 NOTE — Telephone Encounter (Signed)
Spoke with Lovena Le verbally and was asked to call patient back.  Left vm for patient to call back to schedule appt.

## 2017-02-09 ENCOUNTER — Ambulatory Visit (INDEPENDENT_AMBULATORY_CARE_PROVIDER_SITE_OTHER): Payer: Medicare Other | Admitting: Family

## 2017-02-09 ENCOUNTER — Encounter: Payer: Self-pay | Admitting: Family

## 2017-02-09 DIAGNOSIS — S51819A Laceration without foreign body of unspecified forearm, initial encounter: Secondary | ICD-10-CM | POA: Insufficient documentation

## 2017-02-09 DIAGNOSIS — S51812D Laceration without foreign body of left forearm, subsequent encounter: Secondary | ICD-10-CM | POA: Diagnosis not present

## 2017-02-09 NOTE — Assessment & Plan Note (Signed)
Laceration of forearm appears adequately healed with no evidence of infection. Sutures removed without complication. Continue basic wound care. Follow-up if symptoms of infection develop.

## 2017-02-09 NOTE — Progress Notes (Signed)
   Subjective:    Patient ID: Elizabeth Wilkins, female    DOB: 09-11-1946, 71 y.o.   MRN: [id number]  Chief Complaint  Patient presents with  . Suture / Staple Removal    HPI:  Elizabeth Wilkins is a 71 y.o. female who  has a past medical history of Hyperlipidemia; Hypertension; and Osteopenia. and presents today for a follow up office visit.  This is a new problem. Associated symptom of a wound located on her left arm has been going on for about 12 days following a fall. She was seen in the ED and noted to have a laceration that was repaired with 6 sutures. Reports keep the site clean and dry. Denies fever or evidence of infection.   Allergies  Allergen Reactions  . Morphine Itching      Outpatient Medications Prior to Visit  Medication Sig Dispense Refill  . clobetasol (TEMOVATE) 0.05 % external solution APPLY TO AFFECTED AREA ON SCALP ONCE DAILY AS NEEDED  2  . hydrocortisone (ANUSOL-HC) 2.5 % rectal cream Place 1 application rectally 2 (two) times daily. 30 g 1  . losartan-hydrochlorothiazide (HYZAAR) 100-25 MG tablet Take 1 tablet by mouth daily. Must establish with NEW PCP for additional refills. 30 tablet 0  . valACYclovir (VALTREX) 1000 MG tablet Take 1,000 mg by mouth as needed (cold sores). Reported on 02/12/2016     No facility-administered medications prior to visit.     Review of Systems  Constitutional: Negative for chills and fever.  Skin: Positive for wound.      Objective:    BP 130/90 (BP Location: Left Arm, Patient Position: Sitting, Cuff Size: Large)   Pulse (!) 57   Resp 16   Ht 5\' 10"  (1.778 m)   Wt 184 lb 6.4 oz (83.6 kg)   SpO2 97%   BMI 26.46 kg/m  Nursing note and vital signs reviewed.  Physical Exam  Constitutional: She is oriented to person, place, and time. She appears well-developed and well-nourished. No distress.  Cardiovascular: Normal rate, regular rhythm, normal heart sounds and intact distal pulses.   Pulmonary/Chest: Effort normal and  breath sounds normal.  Neurological: She is alert and oriented to person, place, and time.  Skin: Skin is warm and dry.  Wound appears with good approximation and no evidence of infection. 6 blue colored sutures are noted and intact.   Psychiatric: She has a normal mood and affect. Her behavior is normal. Judgment and thought content normal.       Assessment & Plan:   Problem List Items Addressed This Visit      Other   Laceration of forearm    Laceration of forearm appears adequately healed with no evidence of infection. Sutures removed without complication. Continue basic wound care. Follow-up if symptoms of infection develop.          I am having Ms. Dibuono maintain her losartan-hydrochlorothiazide, clobetasol, valACYclovir, and hydrocortisone.   Follow-up: Return if symptoms worsen or fail to improve.  Mauricio Po, FNP

## 2017-02-09 NOTE — Patient Instructions (Signed)
Thank you for choosing [facility].  SUMMARY AND INSTRUCTIONS:  Cleanse with soap and water.   Avoid alcohol and peroxide.   Triple antibiotic sparingly as needed.  Follow up for signs of infection.    Follow up:  If your symptoms worsen or fail to improve, please contact our office for further instruction, or in case of emergency go directly to the emergency room at the closest medical facility.

## 2017-03-16 ENCOUNTER — Encounter: Payer: Self-pay | Admitting: Family

## 2017-03-16 ENCOUNTER — Ambulatory Visit (INDEPENDENT_AMBULATORY_CARE_PROVIDER_SITE_OTHER): Payer: Medicare Other | Admitting: Family

## 2017-03-16 VITALS — BP 132/78 | HR 64 | Temp 98.3°F | Ht 70.0 in | Wt 186.0 lb

## 2017-03-16 DIAGNOSIS — S51812D Laceration without foreign body of left forearm, subsequent encounter: Secondary | ICD-10-CM | POA: Diagnosis not present

## 2017-03-16 NOTE — Progress Notes (Signed)
   Subjective:    Patient ID: Elizabeth Wilkins, female    DOB: 1946-06-03, 71 y.o.   MRN: [id number]  Chief Complaint  Patient presents with  . Follow-up    stitch removal    HPI:  Elizabeth Wilkins is a 71 y.o. female who  has a past medical history of Hyperlipidemia; Hypertension; and Osteopenia. and presents today for a follow up office visit.  Previously evaluated in the office following a laceration to the forearm that was repaired with sutures. Sutures were removed without complication. Notes that her arm is sore and has a small lump in it. No redness, temperature, or discharge. Occasional cream or lotion, but no other modifying or attempted treatments.   Allergies  Allergen Reactions  . Morphine Itching      Outpatient Medications Prior to Visit  Medication Sig Dispense Refill  . clobetasol (TEMOVATE) 0.05 % external solution APPLY TO AFFECTED AREA ON SCALP ONCE DAILY AS NEEDED  2  . hydrocortisone (ANUSOL-HC) 2.5 % rectal cream Place 1 application rectally 2 (two) times daily. 30 g 1  . losartan-hydrochlorothiazide (HYZAAR) 100-25 MG tablet Take 1 tablet by mouth daily. Must establish with NEW PCP for additional refills. 30 tablet 0  . valACYclovir (VALTREX) 1000 MG tablet Take 1,000 mg by mouth as needed (cold sores). Reported on 02/12/2016     No facility-administered medications prior to visit.     Review of Systems  Constitutional: Negative for chills and fever.  Skin: Negative for color change, rash and wound.      Objective:    BP 132/78 (BP Location: Left Arm, Patient Position: Sitting, Cuff Size: Normal)   Pulse 64   Temp 98.3 F (36.8 C) (Oral)   Ht 5\' 10"  (1.778 m)   Wt 186 lb (84.4 kg)   SpO2 99%   BMI 26.69 kg/m  Nursing note and vital signs reviewed.  Physical Exam  Constitutional: She is oriented to person, place, and time. She appears well-developed and well-nourished. No distress.  Cardiovascular: Normal rate, regular rhythm, normal heart sounds  and intact distal pulses.   Pulmonary/Chest: Effort normal and breath sounds normal.  Neurological: She is alert and oriented to person, place, and time.  Skin: Skin is warm and dry.  Left forearm with healed laceration. Small cyst located under laceration consistent with scar tissue. No evidence of infection.   Psychiatric: She has a normal mood and affect. Her behavior is normal. Judgment and thought content normal.       Assessment & Plan:   Problem List Items Addressed This Visit      Other   Laceration of forearm - Primary    Laceration adequately healed with no evidence of infection. Small nodule/cyst located subcutaneously most likely scar tissue. Advised to continue to monitor and follow-up if symptoms worsen or do not improve.         I have discontinued Ms. Valcarcel's losartan-hydrochlorothiazide, clobetasol, valACYclovir, and hydrocortisone.   Follow-up: Return if symptoms worsen or fail to improve.  Mauricio Po, FNP

## 2017-03-16 NOTE — Assessment & Plan Note (Signed)
Laceration adequately healed with no evidence of infection. Small nodule/cyst located subcutaneously most likely scar tissue. Advised to continue to monitor and follow-up if symptoms worsen or do not improve.

## 2017-03-16 NOTE — Patient Instructions (Signed)
Thank you for choosing [facility].  SUMMARY AND INSTRUCTIONS:  Does not appear to be infected.   Recommend scar massage to help break up scar tissue.   Follow up if symptoms worsen or do not improve.  Follow up:  If your symptoms worsen or fail to improve, please contact our office for further instruction, or in case of emergency go directly to the emergency room at the closest medical facility.

## 2017-03-29 NOTE — Progress Notes (Signed)
Subjective:    Patient ID: Elizabeth Wilkins, female    DOB: 1945-10-11, 71 y.o.   MRN: [id number]  HPI Here for medicare wellness exam.   I have personally reviewed and have noted 1.The patient's medical and social history 2.Their use of alcohol, tobacco or illicit drugs 3.Their current medications and supplements 4.The patient's functional ability including ADL's, fall risks, home safety risks             and hearing or visual impairment. 5.Diet and physical activities 6.Evidence for depression or mood disorders 7.Care team reviewed  - [facility] care center at [place], [place] - looking for new doctor, Gyn - Dr Melba Coon, Dental    Are there smokers in your home (other than you)? No  Risk Factors Exercise:   none Dietary issues discussed:  Well balanced, typically eats twice a day  Cardiac risk factors: advanced age  Depression Screen  Have you felt down, depressed or hopeless? No  Have you felt little interest or pleasure in doing things?  No  Activities of Daily Living In your present state of health, do you have any difficulty performing the following activities?:  Driving? No Managing money?  No Feeding yourself? No Getting from bed to chair? No Climbing a flight of stairs? No Preparing food and eating?: No Bathing or showering? No Getting dressed: No Getting to/using the toilet? No Moving around from place to place: No In the past year have you fallen or had a near fall?: yes, laceration to left forearm   Are you sexually active?  no  Do you have more than one partner?  N/A  Hearing Difficulties: yes - mild Do you often ask people to speak up or repeat themselves? No Do you experience ringing or noises in your ears? No Do you have difficulty understanding soft or whispered voices? yes Vision:              Any change in vision:  No               Up to date with eye exam:   yes Memory:  Do you feel that  you have a problem with memory? No  Do you often misplace items? No  Do you feel safe at home?  Yes  Cognitive Testing  Alert, Orientated? Yes  Normal Appearance? Yes  Recall of three objects?  Yes  Can perform simple calculations? Yes  Displays appropriate judgment? Yes  Can read the correct time from a watch face? Yes   Advanced Directives have been discussed with the patient? Yes, in place     Medications and allergies reviewed with patient and updated if appropriate.  Patient Active Problem List   Diagnosis Date Noted  . 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 Herpes simplex 03/30/2017  . Laceration of forearm 02/09/2017  . Rectal polyp 02/12/2016  . Hyperglycemia 11/26/2015  . Seborrheic dermatitis 05/14/2012  . Osteopenia 11/18/2010  . Dyslipidemia 11/09/2010  . BURSITIS, RIGHT HIP 11/09/2010  . Essential hypertension 11/08/2010    No current outpatient prescriptions on file prior to visit.   No current facility-administered medications on file prior to visit.     Past Medical History:  Diagnosis Date  . Hyperlipidemia   . Hypertension   . Osteopenia     Past Surgical History:  Procedure Laterality Date  . broken wrist Right    plates and screws  . COLONOSCOPY    . TUBAL LIGATION  Social History   Social History  . Marital status: Married    Spouse name: N/A  . Number of children: 3  . Years of education: N/A   Occupational History  . retired    Social History Main Topics  . Smoking status: Never Smoker  . Smokeless tobacco: Never Used  . Alcohol use 12.6 oz/week    14 Glasses of wine, 7 Shots of liquor per week  . Drug use: No  . Sexual activity: Not Asked   Other Topics Concern  . 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      No regular exercise    Family History    Problem Relation Age of Onset  . Pancreatic cancer Mother 3  . Hypertension Mother   . Hyperlipidemia Mother   . Rectal cancer Brother 45  . Lupus Father   . Colon cancer Neg Hx   . Stomach cancer Neg Hx     Review of Systems  Constitutional: Negative for appetite change, chills, fatigue (lower energy) and fever.  HENT: Positive for hearing loss (mild). Negative for tinnitus.   Eyes: Negative for visual disturbance.  Respiratory: Negative for cough, shortness of breath and wheezing.   Cardiovascular: Positive for leg swelling (mild right ankle). Negative for chest pain and palpitations.  Gastrointestinal: Negative for abdominal pain, blood in stool, constipation, diarrhea and nausea.       No gerd  Genitourinary: Negative for dysuria and hematuria.  Musculoskeletal: Positive for arthralgias (mild, achy joints). Negative for back pain.  Skin: Negative for color change and rash.  Neurological: Negative for light-headedness and headaches.  Psychiatric/Behavioral: Negative for dysphoric mood. The patient is not nervous/anxious.        Objective:   Vitals:   03/30/17 0910  BP: (!) 190/110  Pulse: 73  Resp: 12  Temp: 98.1 F (36.7 C)   Wt Readings from Last 3 Encounters:  03/30/17 187 lb (84.8 kg)  03/16/17 186 lb (84.4 kg)  02/09/17 184 lb 6.4 oz (83.6 kg)   Body mass index is 26.83 kg/m.   Physical Exam    Constitutional: Appears well-developed and well-nourished. No distress.  HENT:  Head: Normocephalic and atraumatic.  Neck: Neck supple. No tracheal deviation present. No thyromegaly present.  No cervical lymphadenopathy Cardiovascular: Normal rate, regular rhythm and normal heart sounds.   No murmur heard. No carotid bruit .  No edema Pulmonary/Chest: Effort normal and breath sounds normal. No respiratory distress. No has no wheezes. No rales.  Abdomen: soft, non tender, non distended Skin: Skin is warm and dry. Not diaphoretic.  Psychiatric: Normal mood and  affect. Behavior is normal.      Assessment & Plan:   Wellness Exam: Immunizations   Up to date, discussed shingrix Colonoscopy  Up to date  Mammogram  Not up to date - thinking about getting one Dexa - osteopenia, done 2016 - due in 2018 or 2019 - she deferred this year Gyn  Up to date  Eye exam  Up to date  Hearing loss  - mild, will refer to audiology Memory concerns/difficulties  none Independent of ADLs  fully Stressed the importance of regular exercise   Patient received copy of preventative screening tests/immunizations recommended for the next 5-10 years.  See Problem List for Assessment and Plan of chronic medical problems.

## 2017-03-30 ENCOUNTER — Ambulatory Visit (INDEPENDENT_AMBULATORY_CARE_PROVIDER_SITE_OTHER): Payer: Medicare Other | Admitting: Internal Medicine

## 2017-03-30 ENCOUNTER — Other Ambulatory Visit (INDEPENDENT_AMBULATORY_CARE_PROVIDER_SITE_OTHER): Payer: Medicare Other

## 2017-03-30 ENCOUNTER — Encounter: Payer: Self-pay | Admitting: Internal Medicine

## 2017-03-30 VITALS — BP 190/110 | HR 73 | Temp 98.1°F | Resp 12 | Ht 70.0 in | Wt 187.0 lb

## 2017-03-30 DIAGNOSIS — Z0000 Encounter for general adult medical examination without abnormal findings: Secondary | ICD-10-CM

## 2017-03-30 DIAGNOSIS — B009 Herpesviral infection, unspecified: Secondary | ICD-10-CM | POA: Insufficient documentation

## 2017-03-30 DIAGNOSIS — R030 Elevated blood-pressure reading, without diagnosis of hypertension: Secondary | ICD-10-CM | POA: Diagnosis not present

## 2017-03-30 DIAGNOSIS — N904 Leukoplakia of vulva: Secondary | ICD-10-CM | POA: Insufficient documentation

## 2017-03-30 DIAGNOSIS — E785 Hyperlipidemia, unspecified: Secondary | ICD-10-CM | POA: Diagnosis not present

## 2017-03-30 DIAGNOSIS — R739 Hyperglycemia, unspecified: Secondary | ICD-10-CM | POA: Diagnosis not present

## 2017-03-30 DIAGNOSIS — H9190 Unspecified hearing loss, unspecified ear: Secondary | ICD-10-CM | POA: Insufficient documentation

## 2017-03-30 DIAGNOSIS — E559 Vitamin D deficiency, unspecified: Secondary | ICD-10-CM

## 2017-03-30 DIAGNOSIS — M85859 Other specified disorders of bone density and structure, unspecified thigh: Secondary | ICD-10-CM

## 2017-03-30 LAB — COMPREHENSIVE METABOLIC PANEL
ALBUMIN: 4.4 g/dL (ref 3.5–5.2)
ALT: 15 U/L (ref 0–35)
AST: 17 U/L (ref 0–37)
Alkaline Phosphatase: 99 U/L (ref 39–117)
BUN: 18 mg/dL (ref 6–23)
CALCIUM: 9.7 mg/dL (ref 8.4–10.5)
CHLORIDE: 105 meq/L (ref 96–112)
CO2: 28 mEq/L (ref 19–32)
Creatinine, Ser: 0.88 mg/dL (ref 0.40–1.20)
GFR: 67.14 mL/min (ref >60.00)
Glucose, Bld: 109 mg/dL — ABNORMAL HIGH (ref 70–99)
POTASSIUM: 5.4 meq/L — AB (ref 3.5–5.1)
SODIUM: 140 meq/L (ref 135–145)
Total Bilirubin: 0.6 mg/dL (ref 0.2–1.2)
Total Protein: 7.2 g/dL (ref 6.0–8.3)

## 2017-03-30 LAB — HEMOGLOBIN A1C: HEMOGLOBIN A1C: 5.1 % (ref 4.6–6.5)

## 2017-03-30 LAB — TSH: TSH: 2.51 u[IU]/mL (ref 0.35–4.50)

## 2017-03-30 LAB — CBC WITH DIFFERENTIAL/PLATELET
Basophils Absolute: 0 10*3/uL (ref 0.0–0.1)
Basophils Relative: 0 % (ref 0.0–3.0)
EOS PCT: 0 % (ref 0.0–5.0)
Eosinophils Absolute: 0 10*3/uL (ref 0.0–0.7)
HCT: 42.9 % (ref 36.0–46.0)
HEMOGLOBIN: 14.5 g/dL (ref 12.0–15.0)
Lymphocytes Relative: 38.8 % (ref 12.0–46.0)
Lymphs Abs: 1.4 10*3/uL (ref 0.7–4.0)
MCHC: 33.8 g/dL (ref 30.0–36.0)
MCV: 95.8 fl (ref 78.0–100.0)
MONOS PCT: 10.4 % (ref 3.0–12.0)
Monocytes Absolute: 0.4 10*3/uL (ref 0.1–1.0)
Neutro Abs: 1.8 10*3/uL (ref 1.4–7.7)
Neutrophils Relative %: 50.8 % (ref 43.0–77.0)
Platelets: 244 10*3/uL (ref 150.0–400.0)
RBC: 4.48 Mil/uL (ref 3.87–5.11)
RDW: 13.4 % (ref 11.5–15.5)
WBC: 3.6 10*3/uL — AB (ref 4.0–10.5)

## 2017-03-30 LAB — LIPID PANEL
Cholesterol: 215 mg/dL — ABNORMAL HIGH (ref 0–200)
HDL: 59.5 mg/dL (ref >39.00)
LDL CALC: 139 mg/dL — AB (ref 0–99)
NONHDL: 155.2
Total CHOL/HDL Ratio: 4
Triglycerides: 82 mg/dL (ref 0.0–149.0)
VLDL: 16.4 mg/dL (ref 0.0–40.0)

## 2017-03-30 LAB — VITAMIN D 25 HYDROXY (VIT D DEFICIENCY, FRACTURES): VITD: 32.96 ng/mL (ref 30.00–100.00)

## 2017-03-30 MED ORDER — ZOSTER VAC RECOMB ADJUVANTED 50 MCG/0.5ML IM SUSR: 0.5000 mL | Freq: Once | INTRAMUSCULAR | 1 refills | Status: AC

## 2017-03-30 NOTE — Assessment & Plan Note (Signed)
Check D level - advised that she likely needs to restart supplementation - she was on vitamin D in the past

## 2017-03-30 NOTE — Assessment & Plan Note (Signed)
Discussed increased FRAX  - should consider treatment She will try increasing exercise Will check vitamin D Wants to wait until next year for dexa - will do 2019

## 2017-03-30 NOTE — Assessment & Plan Note (Signed)
Refer to audiology.

## 2017-03-30 NOTE — Patient Instructions (Addendum)
  Elizabeth Wilkins , Thank you for taking time to come for your Medicare Wellness Visit. I appreciate your ongoing commitment to your health goals. Please review the following plan we discussed and let me know if I can assist you in the future.   These are the goals we discussed: Goals    Increase exercise      This is a list of the screening recommended for you and due dates:  Health Maintenance  Topic Date Due  . Mammogram  11/23/2013  . Flu Shot  04/26/2017  . DEXA scan (bone density measurement)  01/15/2018  . Colon Cancer Screening  08/02/2018  . Tetanus Vaccine  01/29/2027  .  Hepatitis C: One time screening is recommended by Center for Disease Control  (CDC) for  adults born from [birth year] through 1966.   Completed  . Pneumonia vaccines  Completed     Test(s) ordered today. Your results will be released to [facility] (or called to you) after review, usually within 72hours after test completion. If any changes need to be made, you will be notified at that same time.  All other Health Maintenance issues reviewed.   All recommended immunizations and age-appropriate screenings are up-to-date or discussed.  No immunizations administered today. Consider the shingles vaccine.   Medications reviewed and updated.  No changes recommended at this time.   Please followup in one year for a wellness visit

## 2017-03-30 NOTE — Assessment & Plan Note (Addendum)
White coat htn BP well controlled at home Will continue to monitor at home Check labs

## 2017-03-30 NOTE — Assessment & Plan Note (Signed)
Check lipid panel  Wants to avoid medication if possible Regular exercise and healthy diet encouraged

## 2017-03-30 NOTE — Assessment & Plan Note (Signed)
Check a1c 

## 2017-04-01 ENCOUNTER — Encounter: Payer: Self-pay | Admitting: Internal Medicine

## 2017-04-18 DIAGNOSIS — N951 Menopausal and female climacteric states: Secondary | ICD-10-CM | POA: Diagnosis not present

## 2017-04-18 DIAGNOSIS — Z01419 Encounter for gynecological examination (general) (routine) without abnormal findings: Secondary | ICD-10-CM | POA: Diagnosis not present

## 2017-04-18 DIAGNOSIS — N362 Urethral caruncle: Secondary | ICD-10-CM | POA: Diagnosis not present

## 2017-04-18 DIAGNOSIS — L900 Lichen sclerosus et atrophicus: Secondary | ICD-10-CM | POA: Diagnosis not present

## 2017-05-22 DIAGNOSIS — Z85828 Personal history of other malignant neoplasm of skin: Secondary | ICD-10-CM | POA: Diagnosis not present

## 2017-05-22 DIAGNOSIS — L218 Other seborrheic dermatitis: Secondary | ICD-10-CM | POA: Diagnosis not present

## 2017-05-22 DIAGNOSIS — L814 Other melanin hyperpigmentation: Secondary | ICD-10-CM | POA: Diagnosis not present

## 2017-05-22 DIAGNOSIS — L821 Other seborrheic keratosis: Secondary | ICD-10-CM | POA: Diagnosis not present

## 2017-05-22 DIAGNOSIS — D1801 Hemangioma of skin and subcutaneous tissue: Secondary | ICD-10-CM | POA: Diagnosis not present

## 2017-07-24 DIAGNOSIS — H179 Unspecified corneal scar and opacity: Secondary | ICD-10-CM | POA: Diagnosis not present

## 2017-08-08 ENCOUNTER — Ambulatory Visit: Payer: Medicare Other | Attending: Internal Medicine | Admitting: Audiology

## 2017-08-08 DIAGNOSIS — R292 Abnormal reflex: Secondary | ICD-10-CM | POA: Diagnosis not present

## 2017-08-08 DIAGNOSIS — Z01118 Encounter for examination of ears and hearing with other abnormal findings: Secondary | ICD-10-CM | POA: Insufficient documentation

## 2017-08-08 DIAGNOSIS — H918X9 Other specified hearing loss, unspecified ear: Secondary | ICD-10-CM | POA: Diagnosis not present

## 2017-08-08 DIAGNOSIS — R2689 Other abnormalities of gait and mobility: Secondary | ICD-10-CM | POA: Insufficient documentation

## 2017-08-08 DIAGNOSIS — R94128 Abnormal results of other function studies of ear and other special senses: Secondary | ICD-10-CM

## 2017-08-08 DIAGNOSIS — H9190 Unspecified hearing loss, unspecified ear: Secondary | ICD-10-CM

## 2017-08-08 DIAGNOSIS — H903 Sensorineural hearing loss, bilateral: Secondary | ICD-10-CM | POA: Diagnosis not present

## 2017-08-08 NOTE — Procedures (Signed)
[facility]  [place], [place] [address]  [phone]   Audiological Evaluation  Patient Name: Elizabeth Wilkins   Status: Outpatient   DOB: 01-05-1946    Diagnosis: Hearing Loss, bilateral                MRN: [id number] Date:  08/08/2017     Referent: Binnie Rail, MD  History: [name] was seen for an audiological evaluation. Primary Concern: Has noticed a change in hearing (difficulty hearing the clock chimes from another room) for the past year. Had right sided dental surgery of upper right tooth.  Pain: None History of ear infections:  Y as a child but none as an adult. History of ear surgery or "tubes" : N History of dizziness/vertigo:   Y - Periodically, has short duration vertigo, when getting up. Had severe vertigo about 10 years ago with nausea and vomiting. History of balance issues:  Y / N Tinnitus: N Sound sensitivity: Y - "I don't like loud sounds, or noisy resturant". History of occupational noise exposure: N History of hypertension: N History of diabetes:  N Family history of hearing loss:  N   Evaluation: Conventional pure tone audiometry from 250Hz  - 8000Hz  with using insert earphones.  Hearing Thresholds are symmetrical ranging from 25-30 dBHL rom 250Hz  - 4000Hz  in each ear dropping at 6000Hz  to 45 dBHL on the right, while remaining at 20 dBHL on the left with a symmetrical drop at 8000Hz  to 55-60 dBHL. The hearing loss is sensorineural bilaterally. Reliability is good Speech reception levels (repeating words near threshold) using recorded spondee word lists:  Right ear: 30 dBHL.  Left ear:  25 dBHL Word recognition (at comfortably loud volumes) using recorded word lists at 65 dBHL (loud conversational speech levels), in quiet.  Right ear: 100%.  Left ear:   100% Word recognition in minimal background noise:  +5 dBHL  Right ear: 86%                              Left ear:  80%  Tympanometry (middle ear  function) with ipsilateral that are elevated to abnormal bilaterally.   Right ear: Borderline middle ear configuration because (Type A). Acoustic reflexes are more elevated than the degree of hearing loss would indicate. May be an artifact of having right sided dental issues/infection currently. Left ear: Normal middle ear function.    CONCLUSION:  The most significant concern is that Alantis has brief episodes of "vertigo" or unsteadiness almost daily. She "closes her eyes until it passes". It most often occurs when waking up in the morning but she states that she almost "fell on the steps" earlier this week. She is interested in having a balance assessment.       [name] has a slight to borderline mild low frequency hearing loss throughout most of the speech range dropping to a moderately severe high frequency hearing loss at 8000Hz  bilaterally - slightly poorer on the right side. With the abnormal acoustic reflexes on the right side, unusual tympanogram configuration and high frequency hearing loss, close monitoring of her hearing is recommended with a repeat hearing evaluation in 6-12 months - earlier if there is a change in hearing, ear pressure, word recognition or tinnitus.  This amount of hearing loss would adversely affect speech communication at normal conversational speech levels.   Word recognition is excellent in each ear in  quiet at loud conversational speech levels bilaterally. In minimal background noise, word recognition drops slightly, but remains good.   [name] may benefit from amplification, at least during social interactions and/or listening to TV;  therefore a hearing aid evaluation is recommended.    Amplification helps make the signal louder and therefore often improves hearing and word recognition.  Amplification has many forms including hearing aids in one or both ears, an assistive listening device which have a microphone and speaker such as a small handheld device  and/or even a surround sound system of speakers.  Amplification may be covered by some insurances, but not all.  It is important to note that hearing aids must be individually fit according to the hearing test results and the ear shape.  Audiologists and hearing aid dealers in [place] must be licensed in order to dispense hearing aids.  In addition, a trial period is mandated by law in our state because often amplification must be tried and then evaluated in order to determine benefit.  There are many excellent choices when it comes to amplification in our area and providers are listed in the phone book under hearing aids, there are audiologists in private practice, those affiliated with Ear, Nose and Throat physicians, and there are audiologists located at [employer].The test results were discussed and [name] counseled. Filling out the form for [name] to obtain a captioned/amplified telephone was discussed and Nevin wanted to "think about it".  RECOMMENDATIONS: 1.  Referral to the [facility] for evaluation of BPPV at [facility] [address] [place], [place], [place] [address] ([place] [phone]). 2.  Close monitoring of her hearing is recommended to rule out a progressive hearing loss and monitor abnormal a) high frequency hearing loss b) acoustic reflexes with a repeat hearing evaluation in 6-12 months - earlier if there is a change in hearing, ear pressure, word recognition or tinnitus.  3.  Consider a hearing aid evaluation, now or following the next audiological evaluation.   4.  Strategies that help improve hearing include: A) Face the speaker directly. Optimal is having the speakers face well - lit.  Unless amplified, being within 3-6 feet of the speaker will enhance word recognition. B) Avoid having the speaker back-lit as this will minimize the ability to use cues from lip-reading, facial expression and gestures. C)  Word recognition is poorer  in background noise. For optimal word recognition, turn off the TV, radio or noisy fan when engaging in conversation. In a restaurant, try to sit away from noise sources and close to the primary speaker.  D)  Ask for topic clarification from time to time in order to remain in the conversation.  Most people don't mind repeating or clarifying a point when asked.  If needed, explain the difficulty hearing in background noise or hearing loss. 5.   Use hearing protection during noisy activities such as using a weed eater, moving the lawn, shooting, etc.    Musician's plugs, are available from [employer] for music related hearing protection because there is no distortion.  Other hearing protection, such as sponge plugs (available at pharmacies) or earmuffs (available at sporting goods stores or department stores such as Paediatric nurse) are useful for noisy activities and venues.  Deborah L. Heide Spark, Au.D., CCC-A Doctor of Audiology 08/08/2017  cc: Binnie Rail, MD

## 2017-08-11 ENCOUNTER — Other Ambulatory Visit: Payer: Self-pay | Admitting: Internal Medicine

## 2017-08-11 DIAGNOSIS — R2689 Other abnormalities of gait and mobility: Secondary | ICD-10-CM

## 2017-08-11 DIAGNOSIS — H8110 Benign paroxysmal vertigo, unspecified ear: Secondary | ICD-10-CM

## 2017-08-14 ENCOUNTER — Ambulatory Visit (INDEPENDENT_AMBULATORY_CARE_PROVIDER_SITE_OTHER): Payer: Medicare Other

## 2017-08-14 DIAGNOSIS — Z23 Encounter for immunization: Secondary | ICD-10-CM

## 2017-08-21 ENCOUNTER — Inpatient Hospital Stay (HOSPITAL_COMMUNITY)
Admission: EM | Admit: 2017-08-21 | Discharge: 2017-08-24 | DRG: 470 | Disposition: A | Discharge status: Home Health | Payer: Medicare Other | Attending: Internal Medicine | Admitting: Internal Medicine

## 2017-08-21 ENCOUNTER — Encounter (HOSPITAL_COMMUNITY): Payer: Self-pay | Admitting: Emergency Medicine

## 2017-08-21 ENCOUNTER — Other Ambulatory Visit: Payer: Self-pay

## 2017-08-21 ENCOUNTER — Emergency Department (HOSPITAL_COMMUNITY): Payer: Medicare Other

## 2017-08-21 DIAGNOSIS — S76021A Laceration of muscle, fascia and tendon of right hip, initial encounter: Secondary | ICD-10-CM | POA: Diagnosis present

## 2017-08-21 DIAGNOSIS — Z8781 Personal history of (healed) traumatic fracture: Secondary | ICD-10-CM | POA: Diagnosis not present

## 2017-08-21 DIAGNOSIS — R739 Hyperglycemia, unspecified: Secondary | ICD-10-CM | POA: Diagnosis present

## 2017-08-21 DIAGNOSIS — W19XXXA Unspecified fall, initial encounter: Secondary | ICD-10-CM

## 2017-08-21 DIAGNOSIS — Z96641 Presence of right artificial hip joint: Secondary | ICD-10-CM

## 2017-08-21 DIAGNOSIS — Z01818 Encounter for other preprocedural examination: Secondary | ICD-10-CM

## 2017-08-21 DIAGNOSIS — M8580 Other specified disorders of bone density and structure, unspecified site: Secondary | ICD-10-CM | POA: Diagnosis present

## 2017-08-21 DIAGNOSIS — E785 Hyperlipidemia, unspecified: Secondary | ICD-10-CM | POA: Diagnosis present

## 2017-08-21 DIAGNOSIS — I119 Hypertensive heart disease without heart failure: Secondary | ICD-10-CM | POA: Diagnosis present

## 2017-08-21 DIAGNOSIS — S72001A Fracture of unspecified part of neck of right femur, initial encounter for closed fracture: Secondary | ICD-10-CM | POA: Diagnosis not present

## 2017-08-21 DIAGNOSIS — Z79899 Other long term (current) drug therapy: Secondary | ICD-10-CM | POA: Diagnosis not present

## 2017-08-21 DIAGNOSIS — M25751 Osteophyte, right hip: Secondary | ICD-10-CM | POA: Diagnosis present

## 2017-08-21 DIAGNOSIS — D72819 Decreased white blood cell count, unspecified: Secondary | ICD-10-CM

## 2017-08-21 DIAGNOSIS — M67853 Other specified disorders of tendon, right hip: Secondary | ICD-10-CM | POA: Diagnosis not present

## 2017-08-21 DIAGNOSIS — Z471 Aftercare following joint replacement surgery: Secondary | ICD-10-CM | POA: Diagnosis not present

## 2017-08-21 DIAGNOSIS — Z09 Encounter for follow-up examination after completed treatment for conditions other than malignant neoplasm: Secondary | ICD-10-CM

## 2017-08-21 DIAGNOSIS — Z8249 Family history of ischemic heart disease and other diseases of the circulatory system: Secondary | ICD-10-CM | POA: Diagnosis not present

## 2017-08-21 DIAGNOSIS — M7061 Trochanteric bursitis, right hip: Secondary | ICD-10-CM | POA: Diagnosis present

## 2017-08-21 DIAGNOSIS — Z885 Allergy status to narcotic agent status: Secondary | ICD-10-CM

## 2017-08-21 DIAGNOSIS — S72009A Fracture of unspecified part of neck of unspecified femur, initial encounter for closed fracture: Secondary | ICD-10-CM | POA: Diagnosis present

## 2017-08-21 DIAGNOSIS — M25559 Pain in unspecified hip: Secondary | ICD-10-CM | POA: Diagnosis not present

## 2017-08-21 DIAGNOSIS — R9431 Abnormal electrocardiogram [ECG] [EKG]: Secondary | ICD-10-CM | POA: Diagnosis not present

## 2017-08-21 DIAGNOSIS — W1839XA Other fall on same level, initial encounter: Secondary | ICD-10-CM | POA: Diagnosis present

## 2017-08-21 DIAGNOSIS — S72091A Other fracture of head and neck of right femur, initial encounter for closed fracture: Secondary | ICD-10-CM | POA: Diagnosis not present

## 2017-08-21 DIAGNOSIS — Z9851 Tubal ligation status: Secondary | ICD-10-CM

## 2017-08-21 DIAGNOSIS — T148XXA Other injury of unspecified body region, initial encounter: Secondary | ICD-10-CM | POA: Diagnosis not present

## 2017-08-21 DIAGNOSIS — I459 Conduction disorder, unspecified: Secondary | ICD-10-CM | POA: Diagnosis present

## 2017-08-21 DIAGNOSIS — M7071 Other bursitis of hip, right hip: Secondary | ICD-10-CM | POA: Diagnosis not present

## 2017-08-21 DIAGNOSIS — S72011A Unspecified intracapsular fracture of right femur, initial encounter for closed fracture: Secondary | ICD-10-CM | POA: Diagnosis not present

## 2017-08-21 LAB — CBC WITH DIFFERENTIAL/PLATELET
BASOS PCT: 0 %
Basophils Absolute: 0 10*3/uL (ref 0.0–0.1)
EOS ABS: 0 10*3/uL (ref 0.0–0.7)
Eosinophils Relative: 0 %
HCT: 37.8 % (ref 36.0–46.0)
HEMOGLOBIN: 13.1 g/dL (ref 12.0–15.0)
Lymphocytes Relative: 29 %
Lymphs Abs: 1.1 10*3/uL (ref 0.7–4.0)
MCH: 32.9 pg (ref 26.0–34.0)
MCHC: 34.7 g/dL (ref 30.0–36.0)
MCV: 95 fL (ref 78.0–100.0)
MONOS PCT: 7 %
Monocytes Absolute: 0.3 10*3/uL (ref 0.1–1.0)
NEUTROS PCT: 64 %
Neutro Abs: 2.4 10*3/uL (ref 1.7–7.7)
Platelets: 225 10*3/uL (ref 150–400)
RBC: 3.98 MIL/uL (ref 3.87–5.11)
RDW: 13.3 % (ref 11.5–15.5)
WBC: 3.7 10*3/uL — AB (ref 4.0–10.5)

## 2017-08-21 LAB — BASIC METABOLIC PANEL
ANION GAP: 6 (ref 5–15)
BUN: 17 mg/dL (ref 6–20)
CALCIUM: 9.1 mg/dL (ref 8.9–10.3)
CO2: 27 mmol/L (ref 22–32)
CREATININE: 0.91 mg/dL (ref 0.44–1.00)
Chloride: 108 mmol/L (ref 101–111)
GLUCOSE: 101 mg/dL — AB (ref 65–99)
Potassium: 4.2 mmol/L (ref 3.5–5.1)
Sodium: 141 mmol/L (ref 135–145)

## 2017-08-21 MED ORDER — METHOCARBAMOL 500 MG PO TABS
500.0000 mg | ORAL_TABLET | Freq: Four times a day (QID) | ORAL | Status: DC | PRN
  Administered 2017-08-21 – 2017-08-23 (×3): 500 mg via ORAL
  Filled 2017-08-21 (×4): qty 1

## 2017-08-21 MED ORDER — TRANEXAMIC ACID 1000 MG/10ML IV SOLN
1000.0000 mg | INTRAVENOUS | Status: AC
  Administered 2017-08-22: 1000 mg via INTRAVENOUS
  Filled 2017-08-21: qty 1100
  Filled 2017-08-21: qty 10

## 2017-08-21 MED ORDER — POVIDONE-IODINE 10 % EX SWAB
2.0000 "application " | Freq: Once | CUTANEOUS | Status: AC
  Administered 2017-08-22: 2 via TOPICAL

## 2017-08-21 MED ORDER — CHLORHEXIDINE GLUCONATE 4 % EX LIQD
60.0000 mL | Freq: Once | CUTANEOUS | Status: AC
  Administered 2017-08-22: 4 via TOPICAL

## 2017-08-21 MED ORDER — METHOCARBAMOL 1000 MG/10ML IJ SOLN
500.0000 mg | Freq: Four times a day (QID) | INTRAVENOUS | Status: DC | PRN
  Filled 2017-08-21: qty 5

## 2017-08-21 MED ORDER — ONDANSETRON HCL 4 MG/2ML IJ SOLN
4.0000 mg | Freq: Four times a day (QID) | INTRAMUSCULAR | Status: DC | PRN
  Administered 2017-08-21 – 2017-08-22 (×2): 4 mg via INTRAVENOUS
  Filled 2017-08-21 (×3): qty 2

## 2017-08-21 MED ORDER — HYDROMORPHONE HCL 1 MG/ML IJ SOLN
0.5000 mg | INTRAMUSCULAR | Status: DC | PRN
  Administered 2017-08-22 (×3): 0.5 mg via INTRAVENOUS
  Filled 2017-08-21 (×3): qty 0.5

## 2017-08-21 MED ORDER — CEFAZOLIN SODIUM-DEXTROSE 2-4 GM/100ML-% IV SOLN
2.0000 g | INTRAVENOUS | Status: DC
  Filled 2017-08-21: qty 100

## 2017-08-21 MED ORDER — CEFAZOLIN SODIUM-DEXTROSE 2-4 GM/100ML-% IV SOLN: 2.0000 g | INTRAVENOUS | Status: DC

## 2017-08-21 MED ORDER — LACTATED RINGERS IV SOLN
INTRAVENOUS | Status: DC
  Administered 2017-08-21 – 2017-08-23 (×4): via INTRAVENOUS

## 2017-08-21 MED ORDER — DOCUSATE SODIUM 100 MG PO CAPS
100.0000 mg | ORAL_CAPSULE | Freq: Two times a day (BID) | ORAL | Status: DC
  Administered 2017-08-21 – 2017-08-24 (×5): 100 mg via ORAL
  Filled 2017-08-21 (×5): qty 1

## 2017-08-21 MED ORDER — HYDROMORPHONE HCL 1 MG/ML IJ SOLN
0.5000 mg | Freq: Once | INTRAMUSCULAR | Status: AC
  Administered 2017-08-21: 0.5 mg via INTRAVENOUS
  Filled 2017-08-21: qty 1

## 2017-08-21 MED ORDER — HYDROCODONE-ACETAMINOPHEN 5-325 MG PO TABS
1.0000 | ORAL_TABLET | Freq: Four times a day (QID) | ORAL | Status: DC | PRN
  Administered 2017-08-21 – 2017-08-22 (×2): 1 via ORAL
  Filled 2017-08-21: qty 2
  Filled 2017-08-21: qty 1

## 2017-08-21 MED ORDER — ACETAMINOPHEN 10 MG/ML IV SOLN
1000.0000 mg | INTRAVENOUS | Status: AC
  Administered 2017-08-22: 1000 mg via INTRAVENOUS
  Filled 2017-08-21: qty 100

## 2017-08-21 MED ORDER — HYDROMORPHONE HCL 1 MG/ML IJ SOLN
1.0000 mg | Freq: Once | INTRAMUSCULAR | Status: AC
Start: 2017-08-21 — End: 2017-08-21
  Administered 2017-08-21: 1 mg via INTRAVENOUS
  Filled 2017-08-21: qty 1

## 2017-08-21 NOTE — ED Notes (Signed)
Bed: [facility] Expected date:  Expected time:  Means of arrival:  Comments: 

## 2017-08-21 NOTE — ED Provider Notes (Signed)
[facility] DEPT Provider Note   CSN: [id number] Arrival date & time: 08/21/17  1718     History   Chief Complaint Chief Complaint  Patient presents with  . Hip Pain    HPI Elizabeth Wilkins is a 71 y.o. female with a PMHx of HLD, HTN (not on meds), osteopenia, and other conditions listed below, who presents to the ED via EMS with complaints of right hip pain after a mechanical fall approximately 45 minutes ago.  Patient states that she was looking down at her hands at the flowers that she had just picked up, and accidentally bumped her forehead into the camper that was in front of her, causing her to fall backwards onto her right buttock on the concrete floor.  She had no LOC, and denies hitting her head on the concrete.  She complains of immediate onset 8/10 constant aching nonradiating right hip pain that worsens with movement, and has been improved with 150 mcg of fentanyl.  She denies use of blood thinners.  She states that she cannot move her right hip due to intense pain.  She denies any headache, vision changes, pain in her forehead, recent fevers or chills, CP, S OB, abdominal pain, nausea, vomiting, dysuria, hematuria, incontinence of urine or stool, saddle anesthesia or cauda equina symptoms, back or neck pain, pain elsewhere in her body, numbness, tingling, or any other complaints at this time.  She last ate around 10:30 AM, has not eaten or drank anything since then.  Her PCP is [facility].  She has been seen for orthopedic care by Dr. Theda Sers at [facility]. No prior hx of hip injury to this hip.    The history is provided by the patient and medical records. No language interpreter was used.  Hip Pain  This is a new problem. The current episode started less than 1 hour ago. The problem occurs constantly. The problem has not changed since onset.Pertinent negatives include no chest pain, no abdominal pain, no headaches and no shortness of  breath. Exacerbated by: movement. Relieved by: fentanyl. Treatments tried: fentanyl. The treatment provided moderate relief.    Past Medical History:  Diagnosis Date  . Hyperlipidemia   . Hypertension   . Osteopenia     Patient Active Problem List   Diagnosis Date Noted  . 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 Herpes simplex 03/30/2017  . Vitamin D deficiency 03/30/2017  . Decreased hearing 03/30/2017  . Laceration of forearm 02/09/2017  . Rectal polyp 02/12/2016  . Hyperglycemia 11/26/2015  . Seborrheic dermatitis 05/14/2012  . Osteopenia 11/18/2010  . Dyslipidemia 11/09/2010  . BURSITIS, RIGHT HIP 11/09/2010  . Elevated blood pressure reading 11/08/2010    Past Surgical History:  Procedure Laterality Date  . broken wrist Right    plates and screws  . COLONOSCOPY    . TUBAL LIGATION      OB History    No data available       Home Medications    Prior to Admission medications   Not on File    Family History Family History  Problem Relation Age of Onset  . Pancreatic cancer Mother 75  . Hypertension Mother   . Hyperlipidemia Mother   . Rectal cancer Brother 77  . Lupus Father   . Colon cancer Neg Hx   . Stomach cancer Neg Hx     Social History Social History   Tobacco Use  . Smoking status: Never Smoker  .  Smokeless tobacco: Never Used  Substance Use Topics  . Alcohol use: Yes    Alcohol/week: 12.6 oz    Types: 14 Glasses of wine, 7 Shots of liquor per week  . Drug use: No     Allergies   Morphine   Review of Systems Review of Systems  Constitutional: Negative for chills and fever.  HENT: Negative for facial swelling.   Eyes: Negative for visual disturbance.  Respiratory: Negative for shortness of breath.   Cardiovascular: Negative for chest pain.  Gastrointestinal: Negative for abdominal pain, nausea and vomiting.  Genitourinary: Negative for difficulty urinating (no incontinence), dysuria and hematuria.    Musculoskeletal: Positive for arthralgias. Negative for back pain, myalgias and neck pain.  Skin: Negative for color change.  Allergic/Immunologic: Negative for immunocompromised state.  Neurological: Negative for syncope, weakness, numbness and headaches.  Hematological: Does not bruise/bleed easily.  Psychiatric/Behavioral: Negative for confusion.   All other systems reviewed and are negative for acute change except as noted in the HPI.    Physical Exam Updated Vital Signs BP (!) 185/117 (BP Location: Right Arm)   Pulse 73   Temp 98.6 F (37 C) (Oral)   Resp 20   Ht 5\' 10"  (1.778 m)   Wt 84.4 kg (186 lb)   SpO2 98%   BMI 26.69 kg/m   Physical Exam  Constitutional: She is oriented to person, place, and time. Vital signs are normal. She appears well-developed and well-nourished.  Non-toxic appearance. No distress.  Afebrile, nontoxic, appears uncomfortable but in NAD; BP elevated, likely from pain  HENT:  Head: Normocephalic and atraumatic. Head is without raccoon's eyes, without Battle's sign, without abrasion, without contusion and without laceration.  Mouth/Throat: Oropharynx is clear and moist and mucous membranes are normal.  [place]/AT, no scalp crepitus or deformity, no tenderness; no bony stepoffs. No abrasions or contusions, no raccoon eyes or battle's sign, no s/sx of basilar skull fx  Eyes: Conjunctivae and EOM are normal. Pupils are equal, round, and reactive to light. Right eye exhibits no discharge. Left eye exhibits no discharge.  PERRL, EOMI, no nystagmus, no visual field deficits   Neck: Normal range of motion. Neck supple. No spinous process tenderness and no muscular tenderness present. No neck rigidity. Normal range of motion present.  FROM intact without spinous process TTP, no bony stepoffs or deformities, no paraspinous muscle TTP or muscle spasms. No rigidity or meningeal signs. No bruising or swelling.   Cardiovascular: Normal rate, regular rhythm, normal heart  sounds and intact distal pulses. Exam reveals no gallop and no friction rub.  No murmur heard. Pulmonary/Chest: Effort normal and breath sounds normal. No respiratory distress. She has no decreased breath sounds. She has no wheezes. She has no rhonchi. She has no rales.  Abdominal: Soft. Normal appearance and bowel sounds are normal. She exhibits no distension. There is no tenderness. There is no rigidity, no rebound, no guarding, no CVA tenderness, no tenderness at McBurney's point and negative Murphy's sign.  Musculoskeletal:       Right hip: She exhibits decreased range of motion (due to pain), tenderness, bony tenderness and deformity. She exhibits no crepitus.       Cervical back: Normal.       Thoracic back: Normal.       Lumbar back: Normal.  C-spine as above, all other spinal levels nonTTP without bony stepoffs or deformities  R hip with limited ROM due to pain; hips wrapped with sheet. Significant lateral joint line/greater trochanteric TTP of  R hip, no crepitus appreciated however leg shortened and held in external rotation; +pain with log roll testing. Distal strength and sensation grossly intact, distal pulses intact, compartments soft. No tenderness to L hip. No tenderness to remainder of R leg. Able to wiggle all toes without difficulty, and bend ankle and knee without difficulty.   Neurological: She is alert and oriented to person, place, and time. She has normal strength. No cranial nerve deficit or sensory deficit. Coordination normal. GCS eye subscore is 4. GCS verbal subscore is 5. GCS motor subscore is 6.  CN 2-12 grossly intact A&O x4 GCS 15 Sensation and strength intact as mentioned above Gait not able to be assessed due to R hip injury Coordination with finger-to-nose WNL Neg pronator drift   Skin: Skin is warm, dry and intact. No rash noted.  Psychiatric: She has a normal mood and affect.  Nursing note and vitals reviewed.    ED Treatments / Results  Labs (all labs  ordered are listed, but only abnormal results are displayed) Labs Reviewed  CBC WITH DIFFERENTIAL/PLATELET - Abnormal; Notable for the following components:      Result Value   WBC 3.7 (*)    All other components within normal limits  BASIC METABOLIC PANEL - Abnormal; Notable for the following components:   Glucose, Bld 101 (*)    All other components within normal limits  URINALYSIS, ROUTINE W REFLEX MICROSCOPIC    EKG  EKG Interpretation None       Radiology Dg Hip Unilat  With Pelvis 2-3 Views Right  Result Date: 08/21/2017 CLINICAL DATA:  Fall with right hip pain EXAM: DG HIP (WITH OR WITHOUT PELVIS) 2-3V RIGHT COMPARISON:  None. FINDINGS: Metallic artifacts due to overlying clothing. Pubic symphysis and rami appear intact. The left femoral head projects in joint. Acute right femoral neck fracture with mild cephalad migration of the femoral trochanter. Femoral head projects in joint. IMPRESSION: Acute right femoral neck fracture. Electronically Signed   By: Donavan Foil M.D.   On: 08/21/2017 18:39    Procedures Procedures (including critical care time)  Medications Ordered in ED Medications  HYDROmorphone (DILAUDID) injection 1 mg (not administered)  HYDROmorphone (DILAUDID) injection 0.5 mg (0.5 mg Intravenous Given 08/21/17 1803)     Initial Impression / Assessment and Plan / ED Course  I have reviewed the triage vital signs and the nursing notes.  Pertinent labs & imaging results that were available during my care of the patient were reviewed by me and considered in my medical decision making (see chart for details).     71 y.o. female here with R hip pain s/p mechanical fall, states was looking down at flowers she had just cut, and bumped her head on the camper causing her to fall backwards onto concrete floor, landing on R hip. Arrives with hips wrapped in sheet, R leg shortened and externally rotated, with exquisite pain to greater trochanteric area, no spinal  TTP, NVI with soft compartments in bilateral extremities, able to move ankle and knee but unable to move hip due to pain. No tenderness elsewhere in the leg. No scalp crepitus or tenderness, no bruising/abrasions, no focal neuro deficits. Doubt need for head imaging. Of note, BP high but likely due to pain. Will give pain meds, get basic labs and xray of R hip, then reassess shortly. Discussed case with my attending Dr. Laverta Baltimore who agrees with plan.   7:02 PM Xray confirms R femoral neck fracture with mild cephalad migration of  femoral trochanter. CBC w/diff with mild leukopenia WBC 3.7 but otherwise WNL, consistent with prior values. BMP WNL. Will discuss with orthopedist. Pt requesting more for pain, states dilaudid helped for a short time; will give 1mg  to see if this helps more. Will reassess after orthopedist returns page.   7:14 PM Bethena Roys RN working in the [facility] with Dr. Lyla Glassing of ortho returning page, relays the message that he would like an xray of her knee, and admission to hospitalist service; NPO after midnight tonight. Will order knee xray and consult hospitalist for admission.   7:27 PM Dr. Lorin [facility] returning page and will admit. Holding orders to be placed by admitting team. Please see their notes for further documentation of care. I appreciate their help with this pleasant pt's care. Pt stable at time of admission.    Final Clinical Impressions(s) / ED Diagnoses   Final diagnoses:  Closed displaced fracture of right femoral neck (HCC)  Closed fracture of right hip, initial encounter [facility])  Fall, initial encounter  Chronic leukopenia    ED Discharge Orders    [address] [place], [place] 08/21/17 1927    Margette Fast, MD 08/22/17 1156

## 2017-08-21 NOTE — Progress Notes (Signed)
Consult acknowledged.  Patient has displaced right femoral neck fracture.  Plan for surgery tomorrow.  N.p.o. after midnight.  Hold chemical DVT prophylaxis.  Full consult note to follow.

## 2017-08-21 NOTE — H&P (Signed)
History and Physical    [name] AVW:[id number] DOB: 10/25/1945 DOA: 08/21/2017  PCP: Binnie Rail, MD Consultants:  Theda Sers - orthopedics; Truman Hayward - hand surgery Patient coming from:  Home - lives with husband; NOK: Husband, [phone]  Chief Complaint: fall  HPI: Elizabeth Wilkins is a 71 y.o. female with no significant medical history presenting after a fall.  Patient was out cutting some holly along the driveway.  She was walking back to the house and was looking down.  She walked into the RV and fell.  No injuries other than her right hip.  She had a pretty good idea that it was broken immediately.  She tried to move her leg and it wouldn't move.  She was unable to lift it as well.     ED Course: 71yo with right hip fracture by x-ray.  Dr. Delfino Lovett requests knee x-ray, hospitalist admission, NPO after midnight for OR tomorrow.  Review of Systems: As per HPI; otherwise review of systems reviewed and negative.   Ambulatory Status:  Ambulates without assistance  Past Medical History:  Diagnosis Date  . Hyperlipidemia   . Hypertension   . Osteopenia     Past Surgical History:  Procedure Laterality Date  . broken wrist Right    plates and screws  . COLONOSCOPY    . TUBAL LIGATION      Social History   Socioeconomic History  . Marital status: Married    Spouse name: Not on file  . Number of children: 3  . Years of education: Not on file  . Highest education level: Not on file  Social Needs  . Financial resource strain: Not on file  . Food insecurity - worry: Not on file  . Food insecurity - inability: Not on file  . Transportation needs - medical: Not on file  . Transportation needs - non-medical: Not on file  Occupational History  . Occupation: retired  Tobacco Use  . Smoking status: Never Smoker  . Smokeless tobacco: Never Used  Substance and Sexual Activity  . Alcohol use: Yes    Alcohol/week: 12.6 oz    Types: 14 Glasses of wine, 7 Shots of liquor per  week    Comment: 2-3 drinks daily  . Drug use: No  . Sexual activity: Not on file  Other Topics Concern  . 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      No regular exercise    Allergies  Allergen Reactions  . Morphine Itching    Family History  Problem Relation Age of Onset  . Pancreatic cancer Mother 46  . Hypertension Mother   . Hyperlipidemia Mother   . Rectal cancer Brother 44  . Lupus Father 32  . Colon cancer Neg Hx   . Stomach cancer Neg Hx     Prior to Admission medications   Not on File    Physical Exam: Vitals:   08/21/17 1732 08/21/17 1939  BP: (!) 185/117 (!) 154/95  Pulse: 73 79  Resp: 20 16  Temp: 98.6 F (37 C)   TempSrc: Oral   SpO2: 98% 97%  Weight: 84.4 kg (186 lb)   Height: 5\' 10"  (1.778 m)      General: Appears calm and comfortable and is NAD Eyes:  PERRL, EOMI, normal lids, iris ENT:  grossly normal  hearing, lips & tongue, mmm; appropriate dentition Neck:  no LAD, masses or thyromegaly Cardiovascular:  RRR, no m/r/g. No LE edema.  Respiratory:   CTA bilaterally with no wheezes/rales/rhonchi.  Normal respiratory effort. Abdomen:  soft, NT, ND, NABS Skin:  no rash or induration seen on limited exam Musculoskeletal: Right foot is shortened and externally rotated.  No LE edema.  Good capillary refill and 2+ distal pulses on the right. Psychiatric:  grossly normal mood and affect, speech fluent and appropriate, AOx3 Neurologic:  CN 2-12 grossly intact, moves all extremities other than right leg in coordinated fashion, sensation intact    Radiological Exams on Admission: Dg Chest Port 1 View  Result Date: 08/21/2017 CLINICAL DATA:  Fall with right hip fracture EXAM: PORTABLE CHEST 1 VIEW COMPARISON:  02/25/2015 FINDINGS: Zipper artifact over the upper chest. No acute consolidation or  pleural effusion. Borderline to mild cardiomegaly. Aortic atherosclerosis. No pneumothorax. IMPRESSION: 1. Borderline to mild cardiomegaly. 2. No radiographic evidence for acute cardiopulmonary abnormality Electronically Signed   By: Donavan Foil M.D.   On: 08/21/2017 20:27   Dg Knee Complete 4 Views Right  Result Date: 08/21/2017 CLINICAL DATA:  Hip fracture EXAM: RIGHT KNEE - COMPLETE 4+ VIEW COMPARISON:  08/21/2017 FINDINGS: Views of the right knee are limited by positioning. Mild lateral positioning of the patella, suspected to be secondary to positioning. No fracture. No large knee effusion IMPRESSION: 1. Limited study secondary to positioning 2. No fracture seen 3. Lateral positioning of the patella, suspect that this is largely related to positioning Electronically Signed   By: Donavan Foil M.D.   On: 08/21/2017 20:26   Dg Hip Unilat  With Pelvis 2-3 Views Right  Result Date: 08/21/2017 CLINICAL DATA:  Fall with right hip pain EXAM: DG HIP (WITH OR WITHOUT PELVIS) 2-3V RIGHT COMPARISON:  None. FINDINGS: Metallic artifacts due to overlying clothing. Pubic symphysis and rami appear intact. The left femoral head projects in joint. Acute right femoral neck fracture with mild cephalad migration of the femoral trochanter. Femoral head projects in joint. IMPRESSION: Acute right femoral neck fracture. Electronically Signed   By: Donavan Foil M.D.   On: 08/21/2017 18:39    EKG: Independently reviewed.  NSR with rate 81; RBBB and LAFB, no evidence of acute ischemia   Labs on Admission: I have personally reviewed the available labs and imaging studies at the time of the admission.  Pertinent labs:   Glucose 101 WBC 3.7 - stable   Assessment/Plan Principal Problem:   Hip fracture (HCC)   -Mechanical fall resulting in hip fracture -Orthopedics consult in AM -NPO after midnight in anticipation of surgical repair tomorrow -SCDs overnight, start Lovenox post-operatively (or as per  ortho) -CXR and EKG prior to surgery if not done in ER -Pain control with Vicodin, prn Dilaudid -SW consult for rehab placement if needed and CM consult for home health services if she does not require rehab -Will need PT consult post-operatively -Hip fracture order set utilized -Mild hyperglycemia, will follow -Reported h/o elevated BP reading and HLD but not taking medications for this issue  Pre-operative clearance -Orthopedic/spinal surgery is associated with an intermediate (1-5%) cardiovascular risk for cardiac death and nonfatal MI -Her revised cardiac index gives a risk estimate of 0.4% -Pre-operative EKG testing and CXR prior to surgery; this was done in the ER -Her Detsky's Modified Cardiac Risk Index score is Class I, with a low cardiac risk -It is reasonable for her to go to the OR without additional  evaluation -She does have mild cardiomegaly with conduction delays; suggest outpatient cardiology f/u but should not impact her operative performance  DVT prophylaxis: SCDs - until cleared for Lovenox by orthopedics Code Status:  Full - confirmed with patient/family Family Communication: Husband present throughout evaluation  Disposition Plan:  Home once clinically improved if able, but willing to agree to placement if needed Consults called: Orthopedics; Nutrition; CM/SW Admission status: Admit - It is my clinical opinion that admission to INPATIENT is reasonable and necessary because this patient will require at least 2 midnights in the hospital to treat this condition based on the medical complexity of the problems presented.  Given the aforementioned information, the predictability of an adverse outcome is felt to be significant.    Karmen Bongo MD [facility]  If note is complete, please contact covering daytime or nighttime physician. [email or web address] Password [facility]  08/21/2017, 8:34 PM

## 2017-08-21 NOTE — ED Notes (Signed)
Bed: JK09 Expected date:  Expected time:  Means of arrival:  Comments: Hold

## 2017-08-21 NOTE — ED Triage Notes (Signed)
Per EMS-while walking she was looking at her feet, hit head on camper and fell back on buttocks-right hip pain and slight shortening of right leg-150 mcg of Fentanyl given in route-18g left [facility]

## 2017-08-21 NOTE — ED Notes (Signed)
ED Provider at bedside. 

## 2017-08-22 ENCOUNTER — Inpatient Hospital Stay (HOSPITAL_COMMUNITY): Payer: Medicare Other | Admitting: Anesthesiology

## 2017-08-22 ENCOUNTER — Inpatient Hospital Stay (HOSPITAL_COMMUNITY): Payer: Medicare Other

## 2017-08-22 ENCOUNTER — Encounter (HOSPITAL_COMMUNITY)
Admission: EM | Disposition: A | Discharge status: Home Health | Payer: Self-pay | Source: Home / Self Care | Attending: Internal Medicine

## 2017-08-22 ENCOUNTER — Encounter (HOSPITAL_COMMUNITY): Payer: Self-pay | Admitting: Registered Nurse

## 2017-08-22 DIAGNOSIS — Z01818 Encounter for other preprocedural examination: Secondary | ICD-10-CM

## 2017-08-22 DIAGNOSIS — S72001A Fracture of unspecified part of neck of right femur, initial encounter for closed fracture: Secondary | ICD-10-CM

## 2017-08-22 HISTORY — PX: TOTAL HIP ARTHROPLASTY: SHX124

## 2017-08-22 LAB — URINALYSIS, ROUTINE W REFLEX MICROSCOPIC
BILIRUBIN URINE: NEGATIVE
GLUCOSE, UA: NEGATIVE mg/dL
KETONES UR: 5 mg/dL — AB
Nitrite: NEGATIVE
PROTEIN: NEGATIVE mg/dL
Specific Gravity, Urine: 1.018 (ref 1.005–1.030)
pH: 7 (ref 5.0–8.0)

## 2017-08-22 LAB — SURGICAL PCR SCREEN
MRSA, PCR: NEGATIVE
Staphylococcus aureus: NEGATIVE

## 2017-08-22 SURGERY — ARTHROPLASTY, HIP, TOTAL, ANTERIOR APPROACH
Anesthesia: General | Site: Hip | Laterality: Right

## 2017-08-22 MED ORDER — SODIUM CHLORIDE 0.9 % IJ SOLN
INTRAMUSCULAR | Status: DC | PRN
  Administered 2017-08-22: 30 mL

## 2017-08-22 MED ORDER — KETOROLAC TROMETHAMINE 30 MG/ML IJ SOLN
INTRAMUSCULAR | Status: DC | PRN
  Administered 2017-08-22: 30 mg via INTRAMUSCULAR

## 2017-08-22 MED ORDER — ALUM & MAG HYDROXIDE-SIMETH 200-200-20 MG/5ML PO SUSP: 30.0000 mL | ORAL | Status: DC | PRN

## 2017-08-22 MED ORDER — ROCURONIUM BROMIDE 50 MG/5ML IV SOSY
PREFILLED_SYRINGE | INTRAVENOUS | Status: AC
  Filled 2017-08-22: qty 5

## 2017-08-22 MED ORDER — MUSCLE RUB 10-15 % EX CREA: 1.0000 "application " | TOPICAL_CREAM | CUTANEOUS | Status: DC | PRN

## 2017-08-22 MED ORDER — ONDANSETRON HCL 4 MG/2ML IJ SOLN
INTRAMUSCULAR | Status: DC | PRN
  Administered 2017-08-22: 4 mg via INTRAVENOUS

## 2017-08-22 MED ORDER — BUPIVACAINE-EPINEPHRINE 0.25% -1:200000 IJ SOLN
INTRAMUSCULAR | Status: DC | PRN
  Administered 2017-08-22: 30 mL

## 2017-08-22 MED ORDER — ISOPROPYL ALCOHOL 70 % SOLN
Status: DC | PRN
  Administered 2017-08-22: 1 via TOPICAL

## 2017-08-22 MED ORDER — HYDROMORPHONE HCL 1 MG/ML IJ SOLN: 0.2500 mg | INTRAMUSCULAR | Status: DC | PRN

## 2017-08-22 MED ORDER — WATER FOR IRRIGATION, STERILE IR SOLN
Status: DC | PRN
  Administered 2017-08-22: 2000 mL via SURGICAL_CAVITY

## 2017-08-22 MED ORDER — OXYCODONE HCL 5 MG/5ML PO SOLN
5.0000 mg | Freq: Once | ORAL | Status: DC | PRN
  Filled 2017-08-22: qty 5

## 2017-08-22 MED ORDER — ACETAMINOPHEN 325 MG PO TABS: 650.0000 mg | ORAL_TABLET | Freq: Four times a day (QID) | ORAL | Status: DC | PRN

## 2017-08-22 MED ORDER — SODIUM CHLORIDE 0.9 % IR SOLN
Status: DC | PRN
  Administered 2017-08-22: 1000 mL
  Administered 2017-08-22: 3000 mL

## 2017-08-22 MED ORDER — PROPOFOL 10 MG/ML IV BOLUS
INTRAVENOUS | Status: DC | PRN
  Administered 2017-08-22: 140 mg via INTRAVENOUS

## 2017-08-22 MED ORDER — LIDOCAINE 2% (20 MG/ML) 5 ML SYRINGE
INTRAMUSCULAR | Status: AC
  Filled 2017-08-22: qty 5

## 2017-08-22 MED ORDER — ACETAMINOPHEN 650 MG RE SUPP: 650.0000 mg | Freq: Four times a day (QID) | RECTAL | Status: DC | PRN

## 2017-08-22 MED ORDER — ONDANSETRON HCL 4 MG/2ML IJ SOLN
INTRAMUSCULAR | Status: AC
  Filled 2017-08-22: qty 2

## 2017-08-22 MED ORDER — DEXAMETHASONE SODIUM PHOSPHATE 10 MG/ML IJ SOLN
INTRAMUSCULAR | Status: AC
  Filled 2017-08-22: qty 1

## 2017-08-22 MED ORDER — CEFAZOLIN SODIUM-DEXTROSE 2-4 GM/100ML-% IV SOLN
INTRAVENOUS | Status: AC
  Filled 2017-08-22: qty 100

## 2017-08-22 MED ORDER — METOCLOPRAMIDE HCL 5 MG/ML IJ SOLN: 5.0000 mg | Freq: Three times a day (TID) | INTRAMUSCULAR | Status: DC | PRN

## 2017-08-22 MED ORDER — LACTATED RINGERS IV SOLN
INTRAVENOUS | Status: DC
  Administered 2017-08-22: 12:00:00 via INTRAVENOUS

## 2017-08-22 MED ORDER — SODIUM CHLORIDE 0.9 % IJ SOLN
INTRAMUSCULAR | Status: AC
  Filled 2017-08-22: qty 50

## 2017-08-22 MED ORDER — PHENOL 1.4 % MT LIQD: 1.0000 | OROMUCOSAL | Status: DC | PRN

## 2017-08-22 MED ORDER — ACETAMINOPHEN 10 MG/ML IV SOLN
INTRAVENOUS | Status: AC
  Filled 2017-08-22: qty 100

## 2017-08-22 MED ORDER — POLYVINYL ALCOHOL 1.4 % OP SOLN: 2.0000 [drp] | OPHTHALMIC | Status: DC | PRN

## 2017-08-22 MED ORDER — LIP MEDEX EX OINT: 1.0000 "application " | TOPICAL_OINTMENT | CUTANEOUS | Status: DC | PRN

## 2017-08-22 MED ORDER — PHENYLEPHRINE 40 MCG/ML (10ML) SYRINGE FOR IV PUSH (FOR BLOOD PRESSURE SUPPORT)
PREFILLED_SYRINGE | INTRAVENOUS | Status: DC | PRN
  Administered 2017-08-22: 80 ug via INTRAVENOUS

## 2017-08-22 MED ORDER — ISOPROPYL ALCOHOL 70 % SOLN
Status: AC
  Filled 2017-08-22: qty 480

## 2017-08-22 MED ORDER — SUGAMMADEX SODIUM 200 MG/2ML IV SOLN
INTRAVENOUS | Status: AC
  Filled 2017-08-22: qty 2

## 2017-08-22 MED ORDER — FENTANYL CITRATE (PF) 250 MCG/5ML IJ SOLN
INTRAMUSCULAR | Status: DC | PRN
Start: 2017-08-22 — End: 2017-08-22
  Administered 2017-08-22: 100 ug via INTRAVENOUS
  Administered 2017-08-22 (×2): 50 ug via INTRAVENOUS

## 2017-08-22 MED ORDER — PHENYLEPHRINE 40 MCG/ML (10ML) SYRINGE FOR IV PUSH (FOR BLOOD PRESSURE SUPPORT)
PREFILLED_SYRINGE | INTRAVENOUS | Status: AC
Start: 2017-08-22 — End: 2017-08-22
  Filled 2017-08-22: qty 10

## 2017-08-22 MED ORDER — ROCURONIUM BROMIDE 10 MG/ML (PF) SYRINGE
PREFILLED_SYRINGE | INTRAVENOUS | Status: DC | PRN
  Administered 2017-08-22: 50 mg via INTRAVENOUS

## 2017-08-22 MED ORDER — LIDOCAINE 2% (20 MG/ML) 5 ML SYRINGE
INTRAMUSCULAR | Status: DC | PRN
  Administered 2017-08-22: 70 mg via INTRAVENOUS

## 2017-08-22 MED ORDER — HYDROCORTISONE 2.5 % RE CREA: 1.0000 "application " | TOPICAL_CREAM | Freq: Four times a day (QID) | RECTAL | Status: DC | PRN

## 2017-08-22 MED ORDER — HYDROCODONE-ACETAMINOPHEN 5-325 MG PO TABS
1.0000 | ORAL_TABLET | ORAL | Status: DC | PRN
  Administered 2017-08-23 (×2): 2 via ORAL
  Administered 2017-08-23 – 2017-08-24 (×3): 1 via ORAL
  Administered 2017-08-24: 2 via ORAL
  Filled 2017-08-22 (×2): qty 2
  Filled 2017-08-22: qty 1
  Filled 2017-08-22: qty 2
  Filled 2017-08-22 (×2): qty 1
  Filled 2017-08-22: qty 2

## 2017-08-22 MED ORDER — GUAIFENESIN-DM 100-10 MG/5ML PO SYRP: 5.0000 mL | ORAL_SOLUTION | ORAL | Status: DC | PRN

## 2017-08-22 MED ORDER — CEFAZOLIN SODIUM-DEXTROSE 2-3 GM-%(50ML) IV SOLR
INTRAVENOUS | Status: DC | PRN
  Administered 2017-08-22: 2 g via INTRAVENOUS

## 2017-08-22 MED ORDER — BUPIVACAINE-EPINEPHRINE (PF) 0.25% -1:200000 IJ SOLN
INTRAMUSCULAR | Status: AC
  Filled 2017-08-22: qty 30

## 2017-08-22 MED ORDER — EPHEDRINE SULFATE-NACL 50-0.9 MG/10ML-% IV SOSY
PREFILLED_SYRINGE | INTRAVENOUS | Status: DC | PRN
  Administered 2017-08-22: 15 mg via INTRAVENOUS

## 2017-08-22 MED ORDER — PROMETHAZINE HCL 25 MG/ML IJ SOLN: 6.2500 mg | INTRAMUSCULAR | Status: DC | PRN

## 2017-08-22 MED ORDER — METOCLOPRAMIDE HCL 5 MG PO TABS: 5.0000 mg | ORAL_TABLET | Freq: Three times a day (TID) | ORAL | Status: DC | PRN

## 2017-08-22 MED ORDER — EPHEDRINE 5 MG/ML INJ
INTRAVENOUS | Status: AC
  Filled 2017-08-22: qty 10

## 2017-08-22 MED ORDER — SUGAMMADEX SODIUM 200 MG/2ML IV SOLN
INTRAVENOUS | Status: DC | PRN
  Administered 2017-08-22: 200 mg via INTRAVENOUS

## 2017-08-22 MED ORDER — PROPOFOL 10 MG/ML IV BOLUS
INTRAVENOUS | Status: AC
  Filled 2017-08-22: qty 20

## 2017-08-22 MED ORDER — DEXAMETHASONE SODIUM PHOSPHATE 10 MG/ML IJ SOLN
INTRAMUSCULAR | Status: DC | PRN
  Administered 2017-08-22: 10 mg via INTRAVENOUS

## 2017-08-22 MED ORDER — KETOROLAC TROMETHAMINE 30 MG/ML IJ SOLN
INTRAMUSCULAR | Status: AC
  Filled 2017-08-22: qty 1

## 2017-08-22 MED ORDER — OXYCODONE HCL 5 MG PO TABS: 5.0000 mg | ORAL_TABLET | Freq: Once | ORAL | Status: DC | PRN

## 2017-08-22 MED ORDER — PANTOPRAZOLE SODIUM 40 MG PO TBEC
40.0000 mg | DELAYED_RELEASE_TABLET | Freq: Two times a day (BID) | ORAL | Status: DC
  Administered 2017-08-22 – 2017-08-24 (×4): 40 mg via ORAL
  Filled 2017-08-22 (×4): qty 1

## 2017-08-22 MED ORDER — ASPIRIN 81 MG PO CHEW
81.0000 mg | CHEWABLE_TABLET | Freq: Two times a day (BID) | ORAL | Status: DC
  Administered 2017-08-23 – 2017-08-24 (×3): 81 mg via ORAL
  Filled 2017-08-22 (×4): qty 1

## 2017-08-22 MED ORDER — FENTANYL CITRATE (PF) 250 MCG/5ML IJ SOLN
INTRAMUSCULAR | Status: AC
  Filled 2017-08-22: qty 5

## 2017-08-22 MED ORDER — SALINE SPRAY 0.65 % NA SOLN: 1.0000 | NASAL | Status: DC | PRN

## 2017-08-22 SURGICAL SUPPLY — 44 items
ANCHOR SUT QUATTRO KNTLS 4.5 (Anchor) ×4 IMPLANT
BAG DECANTER FOR FLEXI CONT (MISCELLANEOUS) IMPLANT
BAG ZIPLOCK 12X15 (MISCELLANEOUS) ×2 IMPLANT
CAPT HIP TOTAL 2 ×2 IMPLANT
CHLORAPREP W/TINT 26ML (MISCELLANEOUS) ×2 IMPLANT
CLOTH BEACON ORANGE TIMEOUT ST (SAFETY) ×2 IMPLANT
COVER PERINEAL POST (MISCELLANEOUS) ×2 IMPLANT
COVER SURGICAL LIGHT HANDLE (MISCELLANEOUS) ×2 IMPLANT
DECANTER SPIKE VIAL GLASS SM (MISCELLANEOUS) ×2 IMPLANT
DERMABOND ADVANCED (GAUZE/BANDAGES/DRESSINGS) ×2
DERMABOND ADVANCED .7 DNX12 (GAUZE/BANDAGES/DRESSINGS) ×2 IMPLANT
DRAPE SHEET LG 3/4 BI-LAMINATE (DRAPES) ×6 IMPLANT
DRAPE STERI IOBAN 125X83 (DRAPES) ×2 IMPLANT
DRAPE U-SHAPE 47X51 STRL (DRAPES) ×4 IMPLANT
DRSG AQUACEL AG ADV 3.5X10 (GAUZE/BANDAGES/DRESSINGS) ×2 IMPLANT
ELECT PENCIL ROCKER SW 15FT (MISCELLANEOUS) ×2 IMPLANT
ELECT REM PT RETURN 15FT ADLT (MISCELLANEOUS) ×2 IMPLANT
GAUZE SPONGE 4X4 12PLY STRL (GAUZE/BANDAGES/DRESSINGS) ×2 IMPLANT
GLOVE BIO SURGEON STRL SZ8.5 (GLOVE) ×4 IMPLANT
GLOVE BIOGEL PI IND STRL 8.5 (GLOVE) ×1 IMPLANT
GLOVE BIOGEL PI INDICATOR 8.5 (GLOVE) ×1
GOWN SPEC L3 XXLG W/TWL (GOWN DISPOSABLE) ×2 IMPLANT
HANDPIECE INTERPULSE COAX TIP (DISPOSABLE) ×1
HOLDER FOLEY CATH W/STRAP (MISCELLANEOUS) ×2 IMPLANT
HOOD PEEL AWAY FLYTE STAYCOOL (MISCELLANEOUS) ×8 IMPLANT
MARKER SKIN DUAL TIP RULER LAB (MISCELLANEOUS) ×2 IMPLANT
NEEDLE SPNL 18GX3.5 QUINCKE PK (NEEDLE) ×2 IMPLANT
PACK ANTERIOR HIP CUSTOM (KITS) ×2 IMPLANT
SAW OSC TIP CART 19.5X105X1.3 (SAW) ×2 IMPLANT
SEALER BIPOLAR AQUA 6.0 (INSTRUMENTS) ×2 IMPLANT
SET HNDPC FAN SPRY TIP SCT (DISPOSABLE) ×1 IMPLANT
SUT ETHIBOND NAB CT1 #1 30IN (SUTURE) ×4 IMPLANT
SUT MNCRL AB 3-0 PS2 18 (SUTURE) ×2 IMPLANT
SUT MON AB 2-0 CT1 36 (SUTURE) ×4 IMPLANT
SUT STRATAFIX PDO 1 14 VIOLET (SUTURE) ×1
SUT STRATFX PDO 1 14 VIOLET (SUTURE) ×1
SUT VIC AB 2-0 CT1 27 (SUTURE) ×1
SUT VIC AB 2-0 CT1 TAPERPNT 27 (SUTURE) ×1 IMPLANT
SUTURE STRATFX PDO 1 14 VIOLET (SUTURE) ×1 IMPLANT
SYR 50ML LL SCALE MARK (SYRINGE) ×2 IMPLANT
TRAY FOLEY W/METER SILVER 14FR (SET/KITS/TRAYS/PACK) ×2 IMPLANT
TRAY FOLEY W/METER SILVER 16FR (SET/KITS/TRAYS/PACK) IMPLANT
WATER STERILE IRR 1000ML POUR (IV SOLUTION) ×2 IMPLANT
YANKAUER SUCT BULB TIP 10FT TU (MISCELLANEOUS) ×2 IMPLANT

## 2017-08-22 NOTE — Anesthesia Preprocedure Evaluation (Signed)
Anesthesia Evaluation  Patient identified by MRN, date of birth, ID band Patient awake    Reviewed: Allergy & Precautions, NPO status , Patient's Chart, lab work & pertinent test results  Airway Mallampati: II  TM Distance: >3 FB Neck ROM: Full    Dental no notable dental hx.    Pulmonary neg pulmonary ROS,    Pulmonary exam normal breath sounds clear to auscultation       Cardiovascular hypertension, Normal cardiovascular exam Rhythm:Regular Rate:Normal     Neuro/Psych negative neurological ROS  negative psych ROS   GI/Hepatic negative GI ROS, Neg liver ROS,   Endo/Other  negative endocrine ROS  Renal/GU negative Renal ROS  negative genitourinary   Musculoskeletal negative musculoskeletal ROS (+)   Abdominal   Peds negative pediatric ROS (+)  Hematology negative hematology ROS (+)   Anesthesia Other Findings   Reproductive/Obstetrics negative OB ROS                             Anesthesia Physical Anesthesia Plan  ASA: II  Anesthesia Plan: General   Post-op Pain Management:    Induction: Intravenous  PONV Risk Score and Plan: 2 and Ondansetron, Dexamethasone and Treatment may vary due to age or medical condition  Airway Management Planned: Oral ETT  Additional Equipment:   Intra-op Plan:   Post-operative Plan: Extubation in OR  Informed Consent: I have reviewed the patients History and Physical, chart, labs and discussed the procedure including the risks, benefits and alternatives for the proposed anesthesia with the patient or authorized representative who has indicated his/her understanding and acceptance.     Dental advisory given  Plan Discussed with: CRNA and Surgeon  Anesthesia Plan Comments:         Anesthesia Quick Evaluation  

## 2017-08-22 NOTE — Consult Note (Signed)
Reason for Consult:right hip fracture Referring Physician: Dr.Ortiz  Elizabeth Wilkins is an 71 y.o. female.  HPI: Patient was brought to [facility] for right hip pain. Overall healthy female. She was walking through her yard and ran in to a RV in the driveway. Causing her to fall and she reported immediate right hip pain. No other area of trauma. No,CP,SOB, or syncope. She is a patient of [facility], clinical and has seen Dr. Theda Sers. Her right wrist surgery was by Dr. Nicoletta Dress at [facility]. Denies any SOb,CP,or numbness or tingling in RLE.  Past Medical History:  Diagnosis Date  . Hyperlipidemia   . Hypertension   . Osteopenia     Past Surgical History:  Procedure Laterality Date  . broken wrist Right    plates and screws  . COLONOSCOPY    . TUBAL LIGATION      Family History  Problem Relation Age of Onset  . Pancreatic cancer Mother 48  . Hypertension Mother   . Hyperlipidemia Mother   . Rectal cancer Brother 21  . Lupus Father 29  . Colon cancer Neg Hx   . Stomach cancer Neg Hx     Social History:  reports that  has never smoked. she has never used smokeless tobacco. She reports that she drinks about 12.6 oz of alcohol per week. She reports that she does not use drugs.  Allergies:  Allergies  Allergen Reactions  . Morphine Itching    Medications: I have reviewed the patient's current medications.  Results for orders placed or performed during the hospital encounter of 08/21/17 (from the past 48 hour(s))  CBC with Differential     Status: Abnormal   Collection Time: 08/21/17  5:51 PM  Result Value Ref Range   WBC 3.7 (L) 4.0 - 10.5 K/uL   RBC 3.98 3.87 - 5.11 MIL/uL   Hemoglobin 13.1 12.0 - 15.0 g/dL   HCT 37.8 36.0 - 46.0 %   MCV 95.0 78.0 - 100.0 fL   MCH 32.9 26.0 - 34.0 pg   MCHC 34.7 30.0 - 36.0 g/dL   RDW 13.3 11.5 - 15.5 %   Platelets 225 150 - 400 K/uL   Neutrophils Relative % 64 %   Neutro Abs 2.4 1.7 - 7.7 K/uL   Lymphocytes Relative 29 %   Lymphs Abs 1.1 0.7 - 4.0  K/uL   Monocytes Relative 7 %   Monocytes Absolute 0.3 0.1 - 1.0 K/uL   Eosinophils Relative 0 %   Eosinophils Absolute 0.0 0.0 - 0.7 K/uL   Basophils Relative 0 %   Basophils Absolute 0.0 0.0 - 0.1 K/uL  Basic metabolic panel     Status: Abnormal   Collection Time: 08/21/17  5:51 PM  Result Value Ref Range   Sodium 141 135 - 145 mmol/L   Potassium 4.2 3.5 - 5.1 mmol/L   Chloride 108 101 - 111 mmol/L   CO2 27 22 - 32 mmol/L   Glucose, Bld 101 (H) 65 - 99 mg/dL   BUN 17 6 - 20 mg/dL   Creatinine, Ser 0.91 0.44 - 1.00 mg/dL   Calcium 9.1 8.9 - 10.3 mg/dL   GFR calc non Af Amer >60 >60 mL/min   GFR calc Af Amer >60 >60 mL/min    Comment: (NOTE) The eGFR has been calculated using the CKD EPI equation. This calculation has not been validated in all clinical situations. eGFR's persistently <60 mL/min signify possible Chronic Kidney Disease.    Anion gap 6 5 -  15  Surgical pcr screen     Status: None   Collection Time: 08/21/17 10:35 PM  Result Value Ref Range   MRSA, PCR NEGATIVE NEGATIVE   Staphylococcus aureus NEGATIVE NEGATIVE    Comment: (NOTE) The Xpert SA Assay (FDA approved for NASAL specimens in patients 4 years of age and older), is one component of a comprehensive surveillance program. It is not intended to diagnose infection nor to guide or monitor treatment.   Urinalysis, Routine w reflex microscopic     Status: Abnormal   Collection Time: 08/22/17  2:02 AM  Result Value Ref Range   Color, Urine YELLOW YELLOW   APPearance CLEAR CLEAR   Specific Gravity, Urine 1.018 1.005 - 1.030   pH 7.0 5.0 - 8.0   Glucose, UA NEGATIVE NEGATIVE mg/dL   Hgb urine dipstick SMALL (A) NEGATIVE   Bilirubin Urine NEGATIVE NEGATIVE   Ketones, ur 5 (A) NEGATIVE mg/dL   Protein, ur NEGATIVE NEGATIVE mg/dL   Nitrite NEGATIVE NEGATIVE   Leukocytes, UA TRACE (A) NEGATIVE   RBC / HPF 0-5 0 - 5 RBC/hpf   WBC, UA 0-5 0 - 5 WBC/hpf   Bacteria, UA RARE (A) NONE SEEN   Squamous Epithelial  / LPF 0-5 (A) NONE SEEN    Dg Chest Port 1 View  Result Date: 08/21/2017 CLINICAL DATA:  Fall with right hip fracture EXAM: PORTABLE CHEST 1 VIEW COMPARISON:  02/25/2015 FINDINGS: Zipper artifact over the upper chest. No acute consolidation or pleural effusion. Borderline to mild cardiomegaly. Aortic atherosclerosis. No pneumothorax. IMPRESSION: 1. Borderline to mild cardiomegaly. 2. No radiographic evidence for acute cardiopulmonary abnormality Electronically Signed   By: Donavan Foil M.D.   On: 08/21/2017 20:27   Dg Knee Complete 4 Views Right  Result Date: 08/21/2017 CLINICAL DATA:  Hip fracture EXAM: RIGHT KNEE - COMPLETE 4+ VIEW COMPARISON:  08/21/2017 FINDINGS: Views of the right knee are limited by positioning. Mild lateral positioning of the patella, suspected to be secondary to positioning. No fracture. No large knee effusion IMPRESSION: 1. Limited study secondary to positioning 2. No fracture seen 3. Lateral positioning of the patella, suspect that this is largely related to positioning Electronically Signed   By: Donavan Foil M.D.   On: 08/21/2017 20:26   Dg Hip Unilat  With Pelvis 2-3 Views Right  Result Date: 08/21/2017 CLINICAL DATA:  Fall with right hip pain EXAM: DG HIP (WITH OR WITHOUT PELVIS) 2-3V RIGHT COMPARISON:  None. FINDINGS: Metallic artifacts due to overlying clothing. Pubic symphysis and rami appear intact. The left femoral head projects in joint. Acute right femoral neck fracture with mild cephalad migration of the femoral trochanter. Femoral head projects in joint. IMPRESSION: Acute right femoral neck fracture. Electronically Signed   By: Donavan Foil M.D.   On: 08/21/2017 18:39    Review of Systems  Constitutional: Negative.   HENT: Negative.   Eyes: Negative.   Respiratory: Negative.   Cardiovascular: Negative.   Gastrointestinal: Negative.   Genitourinary: Negative.   Musculoskeletal: Positive for falls and joint pain.  Skin: Negative.   Neurological:  Negative.   Endo/Heme/Allergies: Negative.   Psychiatric/Behavioral: Negative.    Blood pressure 129/65, pulse 87, temperature 98 F (36.7 C), temperature source Oral, resp. rate 15, height '5\' 10"'  (1.778 m), weight 84.4 kg (186 lb), SpO2 93 %. Physical Exam  Constitutional: She is oriented to person, place, and time. She appears well-developed.  HENT:  Head: Normocephalic.  Eyes: EOM are normal.  Neck: Normal range  of motion.  Cardiovascular: Normal rate and intact distal pulses.  Respiratory: Effort normal.  GI: Soft.  Genitourinary:  Genitourinary Comments: Deferred  Musculoskeletal:  Right hip pain. External rotation of the right foot. Un able to do a straight leg raise. No knee effusion. Calf non tender and soft. Plantar and dorsi flexion in tact. 2+ pedal pulse.   Neurological: She is alert and oriented to person, place, and time.  Skin: Skin is warm and dry.  Psychiatric: Her behavior is normal.    Assessment/Plan: Right hip fracture: NPO Plan for THA later today by Dr.Iviona Hole Pre-op, intra-op, and post-op care discussed in detail. Risk and benefits discussed Admitted to medicine SCDs Bedrest  Pt seen and examined. Agree with A/P.  The risks, benefits, and alternatives were discussed with the patient. There are risks associated with the surgery including, but not limited to, problems with anesthesia (death), infection, instability (giving out of the joint), dislocation, differences in leg length/angulation/rotation, fracture of bones, loosening or failure of implants, hematoma (blood accumulation) which may require surgical drainage, blood clots, pulmonary embolism, nerve injury (foot drop and lateral thigh numbness), and blood vessel injury. The patient understands these risks and elects to proceed.    STILWELL, BRYSON L 08/22/2017, 8:07 AM

## 2017-08-22 NOTE — Progress Notes (Signed)
Nutrition Brief Note  Patient identified per Hip Fracture Protocol.   Wt Readings from Last 15 Encounters:  08/21/17 186 lb (84.4 kg)  03/30/17 187 lb (84.8 kg)  03/16/17 186 lb (84.4 kg)  02/09/17 184 lb 6.4 oz (83.6 kg)  01/28/17 185 lb (83.9 kg)  02/29/16 184 lb (83.5 kg)  02/12/16 187 lb 8 oz (85 kg)  11/26/15 191 lb (86.6 kg)  02/25/15 187 lb (84.8 kg)  12/01/14 190 lb (86.2 kg)  05/02/14 186 lb 2 oz (84.4 kg)  08/02/13 184 lb (83.5 kg)  07/24/13 184 lb (83.5 kg)  05/20/13 184 lb (83.5 kg)  05/14/12 188 lb (85.3 kg)    Body mass index is 26.69 kg/m. Patient meets criteria for overweight, appropriate for age, based on current BMI. Patient with no significant PMH and was admitted following a mechanical fall which resulted in R femur fx. Pt pending surgery for the same and has been NPO since admission for that reason. Weight has been stable x5 years with recent changes in appetite or intakes.   Medications reviewed; 100 mg Colace BID, 40 mg oral Protonix BID.  Labs reviewed.  IVF: LR @ 75 mL/hr. No nutrition interventions warranted at this time. If nutrition issues arise, please re-consult RD.     Elizabeth Matin, MS, RD, LDN, [facility] Inpatient Clinical Dietitian Pager # [phone] After hours/weekend pager # [phone]

## 2017-08-22 NOTE — Anesthesia Postprocedure Evaluation (Signed)
Anesthesia Post Note  Patient: Elizabeth Wilkins  Procedure(s) Performed: TOTAL HIP ARTHROPLASTY ANTERIOR APPROACH AND ABDUCTOR TENDON REPAIR (Right Hip)     Patient location during evaluation: PACU Anesthesia Type: General Level of consciousness: awake and alert Pain management: pain level controlled Vital Signs Assessment: post-procedure vital signs reviewed and stable Respiratory status: spontaneous breathing, nonlabored ventilation and respiratory function stable Cardiovascular status: blood pressure returned to baseline and stable Postop Assessment: no apparent nausea or vomiting Anesthetic complications: no    Last Vitals:  Vitals:   08/22/17 1630 08/22/17 1655  BP: 132/75 (!) 145/76  Pulse: 70 71  Resp: 13 14  Temp: 36.7 C 36.7 C  SpO2: 94% 99%    Last Pain:  Vitals:   08/22/17 1042  TempSrc:   PainSc: [place]

## 2017-08-22 NOTE — Op Note (Signed)
OPERATIVE REPORT  SURGEON: Rod Can, MD   ASSISTANT: Staff.  PREOPERATIVE DIAGNOSIS: Displaced Right femoral neck fracture.     POSTOPERATIVE DIAGNOSIS: 1. Displaced Right femoral neck fracture. 2.  Abductor tendon tear, right hip.  PROCEDURE: 1. Right total hip arthroplasty, anterior approach.  2.  Abductor tendon repair, right hip.  IMPLANTS: DePuy Tri Lock stem, size 8, std offset. DePuy Pinnacle Cup, size 54 mm. DePuy Altrx liner, size 36 by 54 mm, +4 neutral. DePuy Biolox ceramic head ball, size 36 + 1.5 mm. [place] Quattro Link 4.5 mm Knotless Anchor x2.  ANESTHESIA:  General  ESTIMATED BLOOD LOSS:-350 mL    ANTIBIOTICS: 2 g Ancef.  DRAINS: None.  COMPLICATIONS: None.   CONDITION: PACU - hemodynamically stable.   BRIEF CLINICAL NOTE: Elizabeth Wilkins is a 71 y.o. female with a long-standing history of trochanteric bursitis of the right hip.  She had a recent ground-level fall, and injured her right hip.  She was unable to weight-bear.  She was brought to the hospital, where x-rays revealed a displaced right femoral neck fracture.  Due to her young age, the patient was indicated for total hip arthroplasty. The risks, benefits, and alternatives to the procedure were explained, and the patient elected to proceed.  PROCEDURE IN DETAIL: Surgical site was marked by myself in the pre-op holding area. Once inside the operating room, general anesthesia was obtained, and a foley catheter was inserted. The patient was then positioned on the Hana table. All bony prominences were well padded. The hip was prepped and draped in the normal sterile surgical fashion. A time-out was called verifying side and site of surgery. The patient received IV antibiotics within 60 minutes of beginning the procedure.  The direct anterior approach to the hip was performed through the Hueter interval. Lateral femoral circumflex vessels were treated with the Auqumantys.  After the femoral  neck retractors were placed outside the capsule, I was able to identify that she had a full-thickness abductor tendon tear.  The anterior capsule was exposed and an inverted T capsulotomy was made. The fracture hematoma was encountered and evacuated.  The femoral neck cut was made to the level of the templated cut. A corkscrew was placed into the head and the head was removed. The head was passed to the back table and was measured.  Acetabular exposure was achieved, and the pulvinar and labrum were excised. Sequential reaming of the acetabulum was then performed up to a size 53 mm reamer. A 54 mm cup was then opened and impacted into place at approximately 40 degrees of abduction and 20 degrees of anteversion. The final polyethylene liner was impacted into place and acetabular osteophytes were removed.   I then gained femoral exposure taking care to protect the abductors and greater trochanter. This was performed using standard external rotation, extension, and adduction. The capsule was peeled off the inner aspect of the greater trochanter, taking care to preserve the short external rotators. A cookie cutter was used to enter the femoral canal, and then the femoral canal finder was placed. Sequential broaching was performed up to a size 8. Calcar planer was used on the femoral neck remnant. I placed a std offset neck and a trial head ball. The hip was reduced. Leg lengths and offset were checked fluoroscopically. The hip was dislocated and trial components were removed. The final implants were placed, and the hip was reduced.  Fluoroscopy was used toconfirm component position and leg lengths. At 90 degrees of external  rotation and full extension, the hip was stable to an anterior directed force.  I then turned my attention to her abductor tendon tear.  She had a full-thickness tear of the anterior third of the greater trochanter.  I freshened the edge of the tendon with a #10 blade.  I rasped  the greater trochanter to bleeding surface.  Using a #5 fiber tape suture, I placed a horizontal mattress suture in the tendon and achieved excellent bite.  I used a second suture to perform an identical stitch slightly distal to the first.  I identified the area of the bone where the tendon were attached.  I tapped for 2 suture anchors, one just proximal to the vastus ridge, and a second about 2 cm proximal to it.  A suture bridge technique was used and the sutures were passed into the anchor.  Tension was held until the anchors were placed.  The anchors were seated fully.  They were removed, and the sutures were clipped.  The repair was excellent.  The wound was copiously irrigated with normal saline using pulse lavage. Marcaine solution was injected into the periarticular soft tissue. The wound was closed in layers using #1 Vicryl and V-Loc for the fascia, 2-0 Vicryl for the subcutaneous fat, 2-0 Monocryl for the deep dermal layer, 3-0 running Monocryl subcuticular stitch, and Dermabond for the skin. Once the glue was fully dried, an Aquacell Ag dressing was applied. The patient was transported to the recovery room in stable condition. Sponge, needle, and instrument counts were correct at the end of the case x2. The patient tolerated the procedure well and there were no known complications.  Postoperatively, we will readmit the patient to the hospitalist service.  She may weight-bear as tolerated with a walker.  We will start aspirin for DVT prophylaxis.  We will order an abductor brace to protect her tendon repair.  Avoid active abduction for at least 6 weeks.  She will work with physical and occupational therapy.  She will undergo disposition planning.

## 2017-08-22 NOTE — Progress Notes (Signed)
[facility] PROGRESS NOTE    Progress Note  Elizabeth Wilkins  DEY:[id number] DOB: 22-Aug-1946 DOA: 08/21/2017 PCP: Binnie Rail, MD     Brief Narrative:   Elizabeth Wilkins is an 71 y.o. female no significant past medical history comes in for mechanical fall of her right hip and x-ray imaging was done in the ED that showed acute right femoral fracture  Assessment/Plan:   Principal Problem:   Hip fracture [facility]) Active Problems:   Pre-op evaluation  Orthopedic surgery has been consulted Dr. Lyla Glassing, the patient is planned for surgical intervention on 08/22/2017, currently n.p.o. holding anticoagulation. Twelve-lead EKG shows no signs of ischemia, her Lyndel Safe score is 0. Patient would like to avoid skilled nursing facility physical therapy consulted.  DVT prophylaxis: per ortho Family Communication:none Disposition Plan/Barrier to D/C: wants to go home Code Status:     Code Status Orders  (From admission, onward)        Start     Ordered   08/21/17 2103  Full code  Continuous     08/21/17 2102    Code Status History    Date Active Date Inactive Code Status Order ID Comments User Context   This patient has a current code status but no historical code status.        IV Access:    Peripheral IV   Procedures and diagnostic studies:   Dg Chest Port 1 View  Result Date: 08/21/2017 CLINICAL DATA:  Fall with right hip fracture EXAM: PORTABLE CHEST 1 VIEW COMPARISON:  02/25/2015 FINDINGS: Zipper artifact over the upper chest. No acute consolidation or pleural effusion. Borderline to mild cardiomegaly. Aortic atherosclerosis. No pneumothorax. IMPRESSION: 1. Borderline to mild cardiomegaly. 2. No radiographic evidence for acute cardiopulmonary abnormality Electronically Signed   By: Donavan Foil M.D.   On: 08/21/2017 20:27   Dg Knee Complete 4 Views Right  Result Date: 08/21/2017 CLINICAL DATA:  Hip fracture EXAM: RIGHT KNEE - COMPLETE 4+ VIEW COMPARISON:   08/21/2017 FINDINGS: Views of the right knee are limited by positioning. Mild lateral positioning of the patella, suspected to be secondary to positioning. No fracture. No large knee effusion IMPRESSION: 1. Limited study secondary to positioning 2. No fracture seen 3. Lateral positioning of the patella, suspect that this is largely related to positioning Electronically Signed   By: Donavan Foil M.D.   On: 08/21/2017 20:26   Dg Hip Unilat  With Pelvis 2-3 Views Right  Result Date: 08/21/2017 CLINICAL DATA:  Fall with right hip pain EXAM: DG HIP (WITH OR WITHOUT PELVIS) 2-3V RIGHT COMPARISON:  None. FINDINGS: Metallic artifacts due to overlying clothing. Pubic symphysis and rami appear intact. The left femoral head projects in joint. Acute right femoral neck fracture with mild cephalad migration of the femoral trochanter. Femoral head projects in joint. IMPRESSION: Acute right femoral neck fracture. Electronically Signed   By: Donavan Foil M.D.   On: 08/21/2017 18:39     Medical Consultants:    None.  Anti-Infectives:   None  Subjective:    [name] she relates she feels great pain is controlled.  Objective:    Vitals:   08/21/17 1939 08/21/17 2103 08/22/17 0207 08/22/17 0607  BP: (!) 154/95 (!) 166/104 (!) 151/93 129/65  Pulse: 79 84  87  Resp: 16 18  15   Temp:  98.7 F (37.1 C)  98 F (36.7 C)  TempSrc:  Oral  Oral  SpO2: 97% 95%  93%  Weight:  Height:        Intake/Output Summary (Last 24 hours) at 08/22/2017 0811 Last data filed at 08/22/2017 0609 Gross per 24 hour  Intake 525 ml  Output 500 ml  Net 25 ml   Filed Weights   08/21/17 1732  Weight: 84.4 kg (186 lb)    Exam: General exam: In no acute distress. Respiratory system: Good air movement and clear to auscultation. Cardiovascular system: S1 & S2 heard, RRR.  Gastrointestinal system: Abdomen is nondistended, soft and nontender.  Central nervous system: Alert and oriented. No focal  neurological deficits. Extremities: No pedal edema. Skin: No rashes, lesions or ulcers Psychiatry: Judgement and insight appear normal. Mood & affect appropriate.    Data Reviewed:    Labs: Basic Metabolic Panel: Recent Labs  Lab 08/21/17 1751  NA 141  K 4.2  CL 108  CO2 27  GLUCOSE 101*  BUN 17  CREATININE 0.91  CALCIUM 9.1   GFR Estimated Creatinine Clearance: 66.1 mL/min (by C-G formula based on SCr of 0.91 mg/dL). Liver Function Tests: No results for input(s): AST, ALT, ALKPHOS, BILITOT, PROT, ALBUMIN in the last 168 hours. No results for input(s): LIPASE, AMYLASE in the last 168 hours. No results for input(s): AMMONIA in the last 168 hours. Coagulation profile No results for input(s): INR, PROTIME in the last 168 hours.  CBC: Recent Labs  Lab 08/21/17 1751  WBC 3.7*  NEUTROABS 2.4  HGB 13.1  HCT 37.8  MCV 95.0  PLT 225   Cardiac Enzymes: No results for input(s): CKTOTAL, CKMB, CKMBINDEX, TROPONINI in the last 168 hours. BNP (last 3 results) No results for input(s): PROBNP in the last 8760 hours. CBG: No results for input(s): GLUCAP in the last 168 hours. D-Dimer: No results for input(s): DDIMER in the last 72 hours. Hgb A1c: No results for input(s): HGBA1C in the last 72 hours. Lipid Profile: No results for input(s): CHOL, HDL, LDLCALC, TRIG, CHOLHDL, LDLDIRECT in the last 72 hours. Thyroid function studies: No results for input(s): TSH, T4TOTAL, T3FREE, THYROIDAB in the last 72 hours.  Invalid input(s): FREET3 Anemia work up: No results for input(s): VITAMINB12, FOLATE, FERRITIN, TIBC, IRON, RETICCTPCT in the last 72 hours. Sepsis Labs: Recent Labs  Lab 08/21/17 1751  WBC 3.7*   Microbiology Recent Results (from the past 240 hour(s))  Surgical pcr screen     Status: None   Collection Time: 08/21/17 10:35 PM  Result Value Ref Range Status   MRSA, PCR NEGATIVE NEGATIVE Final   Staphylococcus aureus NEGATIVE NEGATIVE Final    Comment:  (NOTE) The Xpert SA Assay (FDA approved for NASAL specimens in patients 54 years of age and older), is one component of a comprehensive surveillance program. It is not intended to diagnose infection nor to guide or monitor treatment.      Medications:   . chlorhexidine  60 mL Topical Once  . docusate sodium  100 mg Oral BID  . povidone-iodine  2 application Topical Once   Continuous Infusions: . acetaminophen    . lactated ringers 75 mL/hr at 08/21/17 2324  . methocarbamol (ROBAXIN)  IV    . tranexamic acid       LOS: 1 day   [place] [facility] Pager [phone]  *Please refer to [email or web address], password [email or web address] to get updated schedule on who will round on this patient, as hospitalists switch teams weekly. If 7PM-7AM, please contact night-coverage at [email or web address], password [email or web address] for any overnight needs.  08/22/2017, 8:11 AM

## 2017-08-22 NOTE — Progress Notes (Signed)
Assumed care of patient from Mia Creek, RN

## 2017-08-22 NOTE — Progress Notes (Signed)
PT Cancellation Note  Patient Details Name: Elizabeth Wilkins MRN: [id number] DOB: 1946-03-01   Cancelled Treatment:    Reason Eval/Treat Not Completed: Patient not medically ready--scheduled for surgery today. Will await reorder from ortho MD. Thanks.   Weston Anna, MPT Pager: [phone]

## 2017-08-22 NOTE — Anesthesia Procedure Notes (Signed)
Procedure Name: Intubation Date/Time: 08/22/2017 12:21 PM Performed by: Talbot Grumbling, CRNA Pre-anesthesia Checklist: Patient identified, Emergency Drugs available, Suction available and Patient being monitored Patient Re-evaluated:Patient Re-evaluated prior to induction Oxygen Delivery Method: Circle system utilized Preoxygenation: Pre-oxygenation with 100% oxygen Induction Type: IV induction Ventilation: Mask ventilation without difficulty Laryngoscope Size: Mac and 3 Grade View: Grade I Tube type: Oral Tube size: 7.5 mm Number of attempts: 1 Airway Equipment and Method: Stylet Placement Confirmation: ETT inserted through vocal cords under direct vision,  positive ETCO2 and breath sounds checked- equal and bilateral Secured at: 21 cm Tube secured with: Tape Dental Injury: Teeth and Oropharynx as per pre-operative assessment

## 2017-08-22 NOTE — Discharge Instructions (Signed)
Dr. Rod Can Joint Replacement Specialist [facility] [address] [place], [place] [address] [phone]   TOTAL HIP REPLACEMENT POSTOPERATIVE DIRECTIONS    Hip Rehabilitation, Guidelines Following Surgery   WEIGHT BEARING Weight bearing as tolerated with assist device (walker, cane, etc) as directed, use it as long as suggested by your surgeon or therapist, typically at least 4-6 weeks.  Wear hip abduction brace at all times.  The results of a hip operation are greatly improved after range of motion and muscle strengthening exercises. Follow all safety measures which are given to protect your hip. If any of these exercises cause increased pain or swelling in your joint, decrease the amount until you are comfortable again. Then slowly increase the exercises. Call your caregiver if you have problems or questions.   HOME CARE INSTRUCTIONS  Most of the following instructions are designed to prevent the dislocation of your new hip.  Remove items at home which could result in a fall. This includes throw rugs or furniture in walking pathways.  Continue medications as instructed at time of discharge.  You may have some home medications which will be placed on hold until you complete the course of blood thinner medication.  You may start showering once you are discharged home. Do not remove your dressing. Do not put on socks or shoes without following the instructions of your caregivers.   Sit on chairs with arms. Use the chair arms to help push yourself up when arising.  Arrange for the use of a toilet seat elevator so you are not sitting low.   Walk with walker as instructed.  You may resume a sexual relationship in one month or when given the OK by your caregiver.  Use walker as long as suggested by your caregivers.  You may put full weight on your legs and walk as much as is comfortable. Avoid periods of inactivity such as sitting longer than an hour  when not asleep. This helps prevent blood clots.  You may return to work once you are cleared by Engineer, production.  Do not drive a car for 6 weeks or until released by your surgeon.  Do not drive while taking narcotics.  Wear elastic stockings for two weeks following surgery during the day but you may remove then at night.  Make sure you keep all of your appointments after your operation with all of your doctors and caregivers. You should call the office at the above phone number and make an appointment for approximately two weeks after the date of your surgery. Please pick up a stool softener and laxative for home use as long as you are requiring pain medications.  ICE to the affected hip every three hours for 30 minutes at a time and then as needed for pain and swelling. Continue to use ice on the hip for pain and swelling from surgery. You may notice swelling that will progress down to the foot and ankle.  This is normal after surgery.  Elevate the leg when you are not up walking on it.   It is important for you to complete the blood thinner medication as prescribed by your doctor.  Continue to use the breathing machine which will help keep your temperature down.  It is common for your temperature to cycle up and down following surgery, especially at night when you are not up moving around and exerting yourself.  The breathing machine keeps your lungs expanded and your temperature down.  RANGE OF  MOTION AND STRENGTHENING EXERCISES  These exercises are designed to help you keep full movement of your hip joint. Follow your caregiver's or physical therapist's instructions. Perform all exercises about fifteen times, three times per day or as directed. Exercise both hips, even if you have had only one joint replacement. These exercises can be done on a training (exercise) mat, on the floor, on a table or on a bed. Use whatever works the best and is most comfortable for you. Use music or television while you  are exercising so that the exercises are a pleasant break in your day. This will make your life better with the exercises acting as a break in routine you can look forward to.  Lying on your back, slowly slide your foot toward your buttocks, raising your knee up off the floor. Then slowly slide your foot back down until your leg is straight again.  Lying on your back spread your legs as far apart as you can without causing discomfort.  Lying on your back, tighten up the muscle in the front of your thigh (quadriceps muscles). You can do this by keeping your leg straight and trying to raise your heel off the floor. This helps strengthen the largest muscle supporting your knee.  Lying on your back, tighten up the muscles of your buttocks both with the legs straight and with the knee bent at a comfortable angle while keeping your heel on the floor.   SKILLED REHAB INSTRUCTIONS: If the patient is transferred to a skilled rehab facility following release from the hospital, a list of the current medications will be sent to the facility for the patient to continue.  When discharged from the skilled rehab facility, please have the facility set up the patient's [place] prior to being released. Also, the skilled facility will be responsible for providing the patient with their medications at time of release from the facility to include their pain medication and their blood thinner medication. If the patient is still at the rehab facility at time of the two week follow up appointment, the skilled rehab facility will also need to assist the patient in arranging follow up appointment in our office and any transportation needs.  MAKE SURE YOU:  Understand these instructions.  Will watch your condition.  Will get help right away if you are not doing well or get worse.  Pick up stool softner and laxative for home use following surgery while on pain medications. Do not remove your dressing. The  dressing is waterproof--it is OK to take showers. Continue to use ice for pain and swelling after surgery. Do not use any lotions or creams on the incision until instructed by your surgeon. Wear hip abduction brace at all times. Total Hip Protocol.

## 2017-08-22 NOTE — Transfer of Care (Signed)
Immediate Anesthesia Transfer of Care Note  Patient: Elizabeth Wilkins  Procedure(s) Performed: TOTAL HIP ARTHROPLASTY ANTERIOR APPROACH AND ABDUCTOR TENDON REPAIR (Right Hip)  Patient Location: PACU  Anesthesia Type:General  Level of Consciousness: sedated  Airway & Oxygen Therapy: Patient Spontanous Breathing and Patient connected to face mask oxygen  Post-op Assessment: Report given to RN and Post -op Vital signs reviewed and stable  Post vital signs: Reviewed and stable  Last Vitals:  Vitals:   08/22/17 0207 08/22/17 0607  BP: (!) 151/93 129/65  Pulse:  87  Resp:  15  Temp:  36.7 C  SpO2:  93%    Last Pain:  Vitals:   08/22/17 1042  TempSrc:   PainSc: 4       Patients Stated Pain Goal: 4 (37/36/68 1594)  Complications: No apparent anesthesia complications

## 2017-08-23 LAB — BASIC METABOLIC PANEL
ANION GAP: 4 — AB (ref 5–15)
BUN: 13 mg/dL (ref 6–20)
CHLORIDE: 108 mmol/L (ref 101–111)
CO2: 25 mmol/L (ref 22–32)
Calcium: 8.4 mg/dL — ABNORMAL LOW (ref 8.9–10.3)
Creatinine, Ser: 0.76 mg/dL (ref 0.44–1.00)
GFR calc non Af Amer: >60 mL/min (ref >60)
Glucose, Bld: 126 mg/dL — ABNORMAL HIGH (ref 65–99)
POTASSIUM: 4.6 mmol/L (ref 3.5–5.1)
SODIUM: 137 mmol/L (ref 135–145)

## 2017-08-23 LAB — CBC
HCT: 32.1 % — ABNORMAL LOW (ref 36.0–46.0)
HEMOGLOBIN: 11 g/dL — AB (ref 12.0–15.0)
MCH: 32.4 pg (ref 26.0–34.0)
MCHC: 34.3 g/dL (ref 30.0–36.0)
MCV: 94.7 fL (ref 78.0–100.0)
Platelets: 194 10*3/uL (ref 150–400)
RBC: 3.39 MIL/uL — AB (ref 3.87–5.11)
RDW: 13.2 % (ref 11.5–15.5)
WBC: 5.2 10*3/uL (ref 4.0–10.5)

## 2017-08-23 MED ORDER — HYDROCODONE-ACETAMINOPHEN 5-325 MG PO TABS: 1.0000 | ORAL_TABLET | Freq: Four times a day (QID) | ORAL | 0 refills | Status: DC | PRN

## 2017-08-23 MED ORDER — ASPIRIN 81 MG PO CHEW: 81.0000 mg | CHEWABLE_TABLET | Freq: Two times a day (BID) | ORAL | 1 refills | Status: DC

## 2017-08-23 NOTE — Progress Notes (Signed)
OT Cancellation Note  Patient Details Name: BROOKLEY SPITLER MRN: [id number] DOB: 1946/07/28   Cancelled Treatment:    Reason Eval/Treat Not Completed: Other (comment).  Awaiting hip abductor brace. Will check back.  Smiley Birr 08/23/2017, 12:20 PM  Lesle Chris, OTR/L [phone] 08/23/2017

## 2017-08-23 NOTE — Progress Notes (Signed)
PROGRESS NOTE    BARRY CULVERHOUSE  WYO:[id number] DOB: [birth year] DOA: 08/21/2017 PCP: Binnie Rail, MD   Brief Narrative: 71 year old female with no significant past medical history patient did after a mechanical fall sustaining acute right femoral neck fracture.  Underwent orthopedic surgery.  Assessment & Plan:   Principal Problem:   Hip fracture ([place]) Active Problems:   Pre-op evaluation   Displaced fracture of right femoral neck (HCC)  Patient tolerated surgery well.  Pain is controlled.  Now waiting for hip abduction brace at all times.  As per case manager the brace will arrive by tomorrow.  Patient will have PT OT evaluation after that and likely go home with the brace and therapies.  Continue docusate and Protonix as ordered.  Continue supportive care  DVT prophylaxis: Aspirin, SCD as per orthopedics Code Status: Full code Family Communication: No family at bedside Disposition Plan: Likely discharge home tomorrow after brace is available and PT OT evaluation.    Consultants:   Orthopedics  Procedures: Hip surgery Antimicrobials: None  Subjective: Seen and examined at bedside.  Adenoma dizziness, nausea, vomiting, chest pain, shortness of breath.  Pain is controlled.  Objective: Vitals:   08/22/17 2158 08/23/17 0220 08/23/17 0644 08/23/17 0958  BP: (!) 144/84 118/62 121/60 (!) 120/48  Pulse: 74 (!) 58 68 81  Resp: 17 13 16 16   Temp: 98.2 F (36.8 C) 97.9 F (36.6 C) 97.8 F (36.6 C) 98.4 F (36.9 C)  TempSrc: Oral Oral Oral Axillary  SpO2: 99% 98% 99% 99%  Weight:      Height:        Intake/Output Summary (Last 24 hours) at 08/23/2017 1327 Last data filed at 08/23/2017 0959 Gross per 24 hour  Intake 3516.25 ml  Output 2700 ml  Net 816.25 ml   Filed Weights   08/21/17 1732  Weight: 84.4 kg (186 lb)    Examination:  General exam: Appears calm and comfortable  Respiratory system: Clear to auscultation. Respiratory effort normal. No wheezing  or crackle Cardiovascular system: S1 & S2 heard, RRR.  No pedal edema. Gastrointestinal system: Abdomen is nondistended, soft and nontender. Normal bowel sounds heard. Central nervous system: Alert and oriented. No focal neurological deficits. Skin: No rashes, lesions or ulcers Psychiatry: Judgement and insight appear normal. Mood & affect appropriate.     Data Reviewed: I have personally reviewed following labs and imaging studies  CBC: Recent Labs  Lab 08/21/17 1751 08/23/17 0608  WBC 3.7* 5.2  NEUTROABS 2.4  --   HGB 13.1 11.0*  HCT 37.8 32.1*  MCV 95.0 94.7  PLT 225 774   Basic Metabolic Panel: Recent Labs  Lab 08/21/17 1751 08/23/17 0608  NA 141 137  K 4.2 4.6  CL 108 108  CO2 27 25  GLUCOSE 101* 126*  BUN 17 13  CREATININE 0.91 0.76  CALCIUM 9.1 8.4*   GFR: Estimated Creatinine Clearance: 75.2 mL/min (by C-G formula based on SCr of 0.76 mg/dL). Liver Function Tests: No results for input(s): AST, ALT, ALKPHOS, BILITOT, PROT, ALBUMIN in the last 168 hours. No results for input(s): LIPASE, AMYLASE in the last 168 hours. No results for input(s): AMMONIA in the last 168 hours. Coagulation Profile: No results for input(s): INR, PROTIME in the last 168 hours. Cardiac Enzymes: No results for input(s): CKTOTAL, CKMB, CKMBINDEX, TROPONINI in the last 168 hours. BNP (last 3 results) No results for input(s): PROBNP in the last 8760 hours. HbA1C: No results for input(s): HGBA1C in the last  72 hours. CBG: No results for input(s): GLUCAP in the last 168 hours. Lipid Profile: No results for input(s): CHOL, HDL, LDLCALC, TRIG, CHOLHDL, LDLDIRECT in the last 72 hours. Thyroid Function Tests: No results for input(s): TSH, T4TOTAL, FREET4, T3FREE, THYROIDAB in the last 72 hours. Anemia Panel: No results for input(s): VITAMINB12, FOLATE, FERRITIN, TIBC, IRON, RETICCTPCT in the last 72 hours. Sepsis Labs: No results for input(s): PROCALCITON, LATICACIDVEN in the last 168  hours.  Recent Results (from the past 240 hour(s))  Surgical pcr screen     Status: None   Collection Time: 08/21/17 10:35 PM  Result Value Ref Range Status   MRSA, PCR NEGATIVE NEGATIVE Final   Staphylococcus aureus NEGATIVE NEGATIVE Final    Comment: (NOTE) The Xpert SA Assay (FDA approved for NASAL specimens in patients 34 years of age and older), is one component of a comprehensive surveillance program. It is not intended to diagnose infection nor to guide or monitor treatment.          Radiology Studies: Pelvis Portable  Result Date: 08/22/2017 CLINICAL DATA:  Status post right total hip arthroplasty. EXAM: PORTABLE PELVIS 1-2 VIEWS COMPARISON:  Fluoroscopic images of same day. FINDINGS: The femoral and acetabular components appear to be well situated. Expected postoperative changes are seen surrounding soft tissues. No fracture or dislocation is noted. IMPRESSION: Status post right total hip arthroplasty. Electronically Signed   By: Marijo Conception, M.D.   On: 08/22/2017 15:53   Dg Chest Port 1 View  Result Date: 08/21/2017 CLINICAL DATA:  Fall with right hip fracture EXAM: PORTABLE CHEST 1 VIEW COMPARISON:  02/25/2015 FINDINGS: Zipper artifact over the upper chest. No acute consolidation or pleural effusion. Borderline to mild cardiomegaly. Aortic atherosclerosis. No pneumothorax. IMPRESSION: 1. Borderline to mild cardiomegaly. 2. No radiographic evidence for acute cardiopulmonary abnormality Electronically Signed   By: Donavan Foil M.D.   On: 08/21/2017 20:27   Dg Knee Complete 4 Views Right  Result Date: 08/21/2017 CLINICAL DATA:  Hip fracture EXAM: RIGHT KNEE - COMPLETE 4+ VIEW COMPARISON:  08/21/2017 FINDINGS: Views of the right knee are limited by positioning. Mild lateral positioning of the patella, suspected to be secondary to positioning. No fracture. No large knee effusion IMPRESSION: 1. Limited study secondary to positioning 2. No fracture seen 3. Lateral  positioning of the patella, suspect that this is largely related to positioning Electronically Signed   By: Donavan Foil M.D.   On: 08/21/2017 20:26   Dg C-arm 61-120 Min-no Report  Result Date: 08/22/2017 Fluoroscopy was utilized by the requesting physician.  No radiographic interpretation.   Dg Hip Operative Unilat W Or W/o Pelvis Right  Result Date: 08/22/2017 CLINICAL DATA:  Total right hip arthroplasty. EXAM: DG C-ARM 61-120 MIN-NO REPORT; OPERATIVE RIGHT HIP WITH PELVIS COMPARISON:  08/21/2017 FLUOROSCOPY TIME:  Fluoroscopy Time:  24 seconds. FINDINGS: Intraoperative fluoroscopic guidance was provided during total right hip arthroplasty. Two fluoroscopic images demonstrates placement of 3 component right hip prosthesis without evidence of fracture or immediate complications. The alignment is normal. IMPRESSION: Fluoroscopic guidance during total right hip arthroplasty without evidence of immediate complications. Electronically Signed   By: Fidela Salisbury M.D.   On: 08/22/2017 15:13   Dg Hip Unilat  With Pelvis 2-3 Views Right  Result Date: 08/21/2017 CLINICAL DATA:  Fall with right hip pain EXAM: DG HIP (WITH OR WITHOUT PELVIS) 2-3V RIGHT COMPARISON:  None. FINDINGS: Metallic artifacts due to overlying clothing. Pubic symphysis and rami appear intact. The left femoral head  projects in joint. Acute right femoral neck fracture with mild cephalad migration of the femoral trochanter. Femoral head projects in joint. IMPRESSION: Acute right femoral neck fracture. Electronically Signed   By: Donavan Foil M.D.   On: 08/21/2017 18:39        Scheduled Meds: . aspirin  81 mg Oral BID WC  . docusate sodium  100 mg Oral BID  . pantoprazole  40 mg Oral BID   Continuous Infusions: . lactated ringers 75 mL/hr at 08/23/17 1234  . methocarbamol (ROBAXIN)  IV       LOS: 2 days    Aycen Porreca Tanna Furry, MD [facility] Pager [phone]  If 7PM-7AM, please contact  night-coverage [email or web address] Password [email or web address] 08/23/2017, 1:27 PM

## 2017-08-23 NOTE — Progress Notes (Signed)
CSW consult:  SNF placement.  Per physician note, plan is for home.  Physical therapy has been consulted. CSW following for SNFneeds, if recommended.   Kathrin Greathouse, Latanya Presser, MSW Clinical Social Worker  [phone] 08/23/2017  10:57 AM

## 2017-08-23 NOTE — Progress Notes (Signed)
   Subjective:  Patient reports pain as mild to moderate.  No c/o.  Objective:   VITALS:   Vitals:   08/22/17 2004 08/22/17 2158 08/23/17 0220 08/23/17 0644  BP: 119/64 (!) 144/84 118/62 121/60  Pulse: 78 74 (!) 58 68  Resp: 15 17 13 16   Temp: 98 F (36.7 C) 98.2 F (36.8 C) 97.9 F (36.6 C) 97.8 F (36.6 C)  TempSrc: Oral Oral Oral Oral  SpO2: 98% 99% 98% 99%  Weight:      Height:        NAD ABD soft Sensation intact distally Intact pulses distally Dorsiflexion/Plantar flexion intact Incision: dressing C/D/I Compartment soft   Lab Results  Component Value Date   WBC 5.2 08/23/2017   HGB 11.0 (L) 08/23/2017   HCT 32.1 (L) 08/23/2017   MCV 94.7 08/23/2017   PLT 194 08/23/2017   BMET    Component Value Date/Time   NA 137 08/23/2017 0608   K 4.6 08/23/2017 0608   CL 108 08/23/2017 0608   CO2 25 08/23/2017 0608   GLUCOSE 126 (H) 08/23/2017 0608   BUN 13 08/23/2017 0608   CREATININE 0.76 08/23/2017 0608   CALCIUM 8.4 (L) 08/23/2017 0608   GFRNONAA >60 08/23/2017 0608   GFRAA >60 08/23/2017 0608     Assessment/Plan: 1 Day Post-Op   Principal Problem:   Hip fracture (HCC) Active Problems:   Pre-op evaluation   Displaced fracture of right femoral neck (HCC)   WBAT with walker Hip abduction brace at all times No active abduction for 6 weeks DVT ppx: ASA, SCDs, TEDs PO pain control PT/OT Dispo: D/C home when ok with hospitalist   Hilton Cork Baleigh Rennaker 08/23/2017, 7:46 AM   Rod Can, MD Cell [phone]

## 2017-08-23 NOTE — Evaluation (Signed)
Physical Therapy Evaluation Patient Details Name: CLEOTA PELLERITO MRN: [id number] DOB: 12-19-1945 Today's Date: 08/23/2017   History of Present Illness  Pt s/p fall with R hip fx and anterior direct THP to repair.  Pt also requiring abductor tendon repair  Clinical Impression  Pt s/p R THR and abductor tendon repair and presents with decreased R LE strength/ROM and post op pain limiting functional mobility.  Pt should progress to dc home with family assist.    Follow Up Recommendations Home health PT    Equipment Recommendations  Rolling walker with 5" wheels    Recommendations for Other Services OT consult     Precautions / Restrictions Precautions Precautions: Fall Required Braces or Orthoses: Other Brace/Splint Other Brace/Splint: hip abductor brace Restrictions Weight Bearing Restrictions: No RLE Weight Bearing: Weight bearing as tolerated      Mobility  Bed Mobility Overal bed mobility: Needs Assistance Bed Mobility: Supine to Sit     Supine to sit: Min assist;Mod assist     General bed mobility comments: cues for sequence and use of L LE to self assist  Transfers Overall transfer level: Needs assistance Equipment used: Rolling walker (2 wheeled) Transfers: Sit to/from Stand Sit to Stand: Min assist         General transfer comment: cues for LE management and use of UEs to self assist  Ambulation/Gait Ambulation/Gait assistance: Min assist Ambulation Distance (Feet): 70 Feet Assistive device: Rolling walker (2 wheeled) Gait Pattern/deviations: Step-to pattern;Decreased step length - left;Decreased step length - right;Shuffle;Trunk flexed Gait velocity: decr Gait velocity interpretation: Below normal speed for age/gender General Gait Details: cues for sequence, posture and position from [employer]            Wheelchair Mobility    Modified Rankin (Stroke Patients Only)       Balance                                              Pertinent Vitals/Pain Pain Assessment: 0-10 Pain Score: 3  Pain Location: R hip Pain Descriptors / Indicators: Sore Pain Intervention(s): Limited activity within patient's tolerance;Monitored during session;Premedicated before session;Ice applied    Home Living Family/patient expects to be discharged to:: Private residence Living Arrangements: Spouse/significant other Available Help at Discharge: Family Type of Home: House Home Access: Stairs to enter   Technical brewer of Steps: 1 Home Layout: Able to live on main level with bedroom/bathroom Home Equipment: None      Prior Function Level of Independence: Independent               Hand Dominance        Extremity/Trunk Assessment   Upper Extremity Assessment Upper Extremity Assessment: Overall WFL for tasks assessed    Lower Extremity Assessment Lower Extremity Assessment: RLE deficits/detail    Cervical / Trunk Assessment Cervical / Trunk Assessment: Normal  Communication   Communication: No difficulties  Cognition Arousal/Alertness: Awake/alert Behavior During Therapy: WFL for tasks assessed/performed Overall Cognitive Status: Within Functional Limits for tasks assessed                                        General Comments      Exercises Total Joint Exercises Ankle Circles/Pumps: AROM;Both;15 reps;Supine   Assessment/Plan  PT Assessment Patient needs continued PT services  PT Problem List Decreased strength;Decreased range of motion;Decreased activity tolerance;Decreased mobility;Decreased knowledge of use of DME;Pain;Decreased knowledge of precautions       PT Treatment Interventions DME instruction;Gait training;Stair training;Functional mobility training;Therapeutic activities;Therapeutic exercise;Patient/family education    PT Goals (Current goals can be found in the Care Plan section)  Acute Rehab PT Goals Patient Stated Goal: Regain IND and resume  previous lifestyle PT Goal Formulation: With patient Time For Goal Achievement: 08/27/17 Potential to Achieve Goals: Good    Frequency 7X/week   Barriers to discharge        Co-evaluation               AM-PAC PT "6 Clicks" Daily Activity  Outcome Measure Difficulty turning over in bed (including adjusting bedclothes, sheets and blankets)?: Unable Difficulty moving from lying on back to sitting on the side of the bed? : Unable Difficulty sitting down on and standing up from a chair with arms (e.g., wheelchair, bedside commode, etc,.)?: Unable Help needed moving to and from a bed to chair (including a wheelchair)?: A Little Help needed walking in hospital room?: A Little Help needed climbing 3-5 steps with a railing? : Total 6 Click Score: 10    End of Session Equipment Utilized During Treatment: Gait belt;Other (comment)(abductor brace) Activity Tolerance: Patient tolerated treatment well Patient left: in chair;with call bell/phone within reach;with chair alarm set Nurse Communication: Mobility status PT Visit Diagnosis: Unsteadiness on feet (R26.81);Difficulty in walking, not elsewhere classified (R26.2)    Time: 2706-2376 PT Time Calculation (min) (ACUTE ONLY): 27 min   Charges:   PT Evaluation $PT Eval Low Complexity: 1 Low PT Treatments $Gait Training: 8-22 mins   PT G Codes:        Pg [phone]   Dealva Lafoy 08/23/2017, 5:34 PM

## 2017-08-24 ENCOUNTER — Telehealth: Payer: Self-pay | Admitting: *Deleted

## 2017-08-24 DIAGNOSIS — W19XXXA Unspecified fall, initial encounter: Secondary | ICD-10-CM

## 2017-08-24 DIAGNOSIS — S72001A Fracture of unspecified part of neck of right femur, initial encounter for closed fracture: Principal | ICD-10-CM

## 2017-08-24 LAB — CBC
HEMATOCRIT: 29.5 % — AB (ref 36.0–46.0)
Hemoglobin: 10.1 g/dL — ABNORMAL LOW (ref 12.0–15.0)
MCH: 32.7 pg (ref 26.0–34.0)
MCHC: 34.2 g/dL (ref 30.0–36.0)
MCV: 95.5 fL (ref 78.0–100.0)
PLATELETS: 176 10*3/uL (ref 150–400)
RBC: 3.09 MIL/uL — AB (ref 3.87–5.11)
RDW: 13.5 % (ref 11.5–15.5)
WBC: 4.7 10*3/uL (ref 4.0–10.5)

## 2017-08-24 LAB — BASIC METABOLIC PANEL
ANION GAP: 3 — AB (ref 5–15)
BUN: 18 mg/dL (ref 6–20)
CALCIUM: 8.4 mg/dL — AB (ref 8.9–10.3)
CO2: 28 mmol/L (ref 22–32)
Chloride: 109 mmol/L (ref 101–111)
Creatinine, Ser: 0.85 mg/dL (ref 0.44–1.00)
Glucose, Bld: 101 mg/dL — ABNORMAL HIGH (ref 65–99)
POTASSIUM: 4.4 mmol/L (ref 3.5–5.1)
Sodium: 140 mmol/L (ref 135–145)

## 2017-08-24 MED ORDER — SENNOSIDES-DOCUSATE SODIUM 8.6-50 MG PO TABS: 1.0000 | ORAL_TABLET | Freq: Two times a day (BID) | ORAL | 0 refills | Status: DC

## 2017-08-24 NOTE — Progress Notes (Signed)
Discharge planning, spoke with patient and spouse at bedside. Have chosen [facility] for [facility] PT and OT. Contacted [facility] for referral. Needs RW and 3n1, contacted [facility] to deliver to room. [phone]

## 2017-08-24 NOTE — Progress Notes (Signed)
Physical Therapy Treatment Patient Details Name: Elizabeth Wilkins MRN: [id number] DOB: Jun 27, 1946 Today's Date: 08/24/2017    History of Present Illness Pt s/p fall with R hip fx and anterior direct THP to repair.  Pt also requiring abductor tendon repair    PT Comments    POD #2, pm session    Ambulated to 1 step with RW. Instructed patient and spouse on performing step backwards on ascent for safety, and forwards on descent. Patient pushed in W/C to car for discharge with husband present. Instructed patient and spouse on car transfers and management of R LE with brace to not flex the hip over 60 degrees. Required VC's to lay back in front seat to position R LE into car.    Follow Up Recommendations  Home health PT     Equipment Recommendations  Rolling walker with 5" wheels    Recommendations for Other Services       Precautions / Restrictions Precautions Precautions: Fall Required Braces or Orthoses: Other Brace/Splint Other Brace/Splint: hip abductor brace at all times Restrictions Weight Bearing Restrictions: No RLE Weight Bearing: Weight bearing as tolerated    Mobility  Bed Mobility               General bed mobility comments: oob  Transfers Overall transfer level: Needs assistance Equipment used: Rolling walker (2 wheeled) Transfers: Sit to/from [facility] Sit to Stand: Min assist Stand pivot transfers: Min guard;Min assist       General transfer comment: 25% VC's for management of LE's and hand placement. Standing pivot into car.   Ambulation/Gait Ambulation/Gait assistance: Min guard Ambulation Distance (Feet): 15 Feet Assistive device: Rolling walker (2 wheeled) Gait Pattern/deviations: Step-to pattern;Decreased step length - left;Decreased step length - right;Shuffle Gait velocity: decreased Gait velocity interpretation: Below normal speed for age/gender General Gait Details: ambulated to 1 step curve. Required VC's for  positioning with walker.    Stairs Stairs: Yes   Stair Management: No rails;Backwards;Forwards;With walker;Step to pattern Number of Stairs: 1 General stair comments: performed 1 step curve with RW, requiring VC's for sequence and safety. Educated patient's spouse on safety and to assist.   Engineer, building services [place] (Stroke Patients Only)       Balance                                            Cognition Arousal/Alertness: Awake/alert Behavior During Therapy: WFL for tasks assessed/performed Overall Cognitive Status: Within Functional Limits for tasks assessed                                        Exercises      General Comments        Pertinent Vitals/Pain Pain Assessment: 0-10 Pain Score: 4  Pain Location: R hip Pain Descriptors / Indicators: Sore Pain Intervention(s): Limited activity within patient's tolerance;Monitored during session;Repositioned    Home Living                      Prior Function            PT Goals (current goals can now be found in the care plan section)      Frequency    7X/week      PT  Plan      Co-evaluation              AM-PAC PT "6 Clicks" Daily Activity  Outcome Measure  Difficulty turning over in bed (including adjusting bedclothes, sheets and blankets)?: A Lot Difficulty moving from lying on back to sitting on the side of the bed? : A Lot Difficulty sitting down on and standing up from a chair with arms (e.g., wheelchair, bedside commode, etc,.)?: A Lot Help needed moving to and from a bed to chair (including a wheelchair)?: A Little Help needed walking in hospital room?: A Little Help needed climbing 3-5 steps with a railing? : A Little 6 Click Score: 15    End of Session Equipment Utilized During Treatment: Gait belt;Other (comment) Activity Tolerance: Patient tolerated treatment well Patient left: Other (comment);with family/visitor present(in  car) Nurse Communication: Mobility status PT Visit Diagnosis: Unsteadiness on feet (R26.81);Difficulty in walking, not elsewhere classified (R26.2)     Time: 7591-6384 PT Time Calculation (min) (ACUTE ONLY): 25 min  Charges:  $Gait Training: 8-22 mins $Therapeutic Activity: 8-22 mins                    G Codes:       [name], SPTA [facility] [place]  PTA [facility]  Acute  Rehab Pager      [phone]

## 2017-08-24 NOTE — Evaluation (Addendum)
Occupational Therapy Evaluation Patient Details Name: Elizabeth Wilkins MRN: [id number] DOB: Jan 31, 1946 Today's Date: 08/24/2017    History of Present Illness Pt s/p fall with R hip fx and anterior direct THP to repair.  Pt also requiring abductor tendon repair   Clinical Impression   This 71 year old female was admitted for the above.  She will benefit from continued OT to increase safety and independence with adls. Goals in acute are for min guard to min A    Follow Up Recommendations  Home health OT    Equipment Recommendations  3 in 1 bedside commode    Recommendations for Other Services       Precautions / Restrictions Restrictions Weight Bearing Restrictions: No RLE Weight Bearing: Weight bearing as tolerated  Hip abductor brace at all times     Mobility Bed Mobility         Supine to sit: Min assist     General bed mobility comments: from flat bed. Assist for RLE; cues for sequence  Transfers   Equipment used: Rolling walker (2 wheeled)   Sit to Stand: Min assist         General transfer comment: slow transitions; cues for UE/LE placement and assist to slide RLE forward    Balance                                           ADL either performed or assessed with clinical judgement   ADL Overall ADL's : Needs assistance/impaired Eating/Feeding: Independent   Grooming: Set up;Sitting   Upper Body Bathing: Set up;Sitting   Lower Body Bathing: Maximal assistance;Sit to/from stand   Upper Body Dressing : Set up;Sitting   Lower Body Dressing: Total assistance;Sit to/from stand   Toilet Transfer: Minimal assistance;Stand-pivot;BSC;RW   Toileting- Clothing Manipulation and Hygiene: Minimal assistance;Sit to/from stand               Vision         Perception     Praxis      Pertinent Vitals/Pain  3/10 pain RN called for pain meds; repositioned pt; worked within pain tolerance     Hand Dominance      Extremity/Trunk Assessment  UEs: [employer]           Communication   WFLs  Cognition    Cognition:  Mostly WFLs.  Pt needs repeated cues to sequence sit to stand                                       General Comments       Exercises     Shoulder Instructions      Home Living    Lives with spouse Has a standard commode on first floor where she will stay Walk in shower No bathroom DME                                      Prior Functioning/Environment                   OT Problem List: Decreased strength;Decreased activity tolerance;Impaired balance (sitting and/or standing);Pain;Decreased knowledge of precautions;Decreased knowledge of use of DME or AE  OT Treatment/Interventions: Self-care/ADL training;DME and/or AE instruction;Patient/family education;Balance training    OT Goals(Current goals can be found in the care plan section) Acute Rehab OT Goals Patient Stated Goal: Regain IND and resume previous lifestyle OT Goal Formulation: With patient Time For Goal Achievement: 08/26/17 Potential to Achieve Goals: Good ADL Goals Pt Will Perform Grooming: with min guard assist;standing Pt Will Perform Lower Body Bathing: with min assist;with adaptive equipment;sit to/from stand Pt Will Perform Lower Body Dressing: with min assist;with adaptive equipment;sit to/from stand Pt Will Transfer to Toilet: with min guard assist;ambulating;bedside commode Pt Will Perform Toileting - Clothing Manipulation and hygiene: with min guard assist;sit to/from stand  OT Frequency: Min 2X/week   Barriers to D/C:            Co-evaluation              AM-PAC PT "6 Clicks" Daily Activity     Outcome Measure Help from another person eating meals?: None Help from another person taking care of personal grooming?: A Little Help from another person toileting, which includes using toliet, bedpan, or urinal?: A Little Help from another person  bathing (including washing, rinsing, drying)?: A Lot Help from another person to put on and taking off regular upper body clothing?: A Little Help from another person to put on and taking off regular lower body clothing?: A Lot 6 Click Score: 17   End of Session    Activity Tolerance: Patient limited by pain Patient left: in chair;with call bell/phone within reach  OT Visit Diagnosis: Pain Pain - Right/Left: Right Pain - part of body: Hip                Time: 6759-1638 OT Time Calculation (min): 25 min Charges:  OT General Charges $OT Visit: 1 Visit OT Evaluation $OT Eval Low Complexity: 1 Low OT Treatments $Self Care/Home Management : 8-22 mins G-Codes:     Lesle Chris, OTR/L [phone] 08/24/2017  Elizabeth Wilkins 08/24/2017, 11:37 AM

## 2017-08-24 NOTE — Discharge Summary (Signed)
Discharge Summary  TARREN SABREE EPP:[id number] DOB: 1946/07/05  PCP: Binnie Rail, MD  Admit date: 08/21/2017 Discharge date: 08/24/2017  Time spent: <59mins  Recommendations for Outpatient Follow-up:  1. F/u with PMD within a week  for hospital discharge follow up, repeat cbc/bmp at follow up 2. F/u with orthopedics Dr Lyla Glassing in two weeks  Discharge Diagnoses:  Active Hospital Problems   Diagnosis Date Noted  . Hip fracture ([place]) 08/21/2017  . Pre-op evaluation 08/22/2017  . Closed displaced fracture of right femoral neck ([place]) 08/22/2017    Resolved Hospital Problems  No resolved problems to display.    Discharge Condition: stable  Diet recommendation: regular diet  Filed Weights   08/21/17 1732  Weight: 84.4 kg (186 lb)    History of present illness: (per admitting MD Dr Lorin Mercy) PCP: Binnie Rail, MD Consultants:  Theda Sers - orthopedics; Truman Hayward - hand surgery Patient coming from:  Home - lives with husband; NOK: Husband, [phone]  Chief Complaint: fall  HPI: Elizabeth Wilkins is a 71 y.o. female with no significant medical history presenting after a fall.  Patient was out cutting some holly along the driveway.  She was walking back to the house and was looking down.  She walked into the RV and fell.  No injuries other than her right hip.  She had a pretty good idea that it was broken immediately.  She tried to move her leg and it wouldn't move.  She was unable to lift it as well.     ED Course: 71yo with right hip fracture by x-ray.  Dr. Delfino Lovett requests knee x-ray, hospitalist admission, NPO after midnight for OR tomorrow.    Hospital Course:  Principal Problem:   Hip fracture ([place]) Active Problems:   Pre-op evaluation   Closed displaced fracture of right femoral neck ([place])   1. Displaced Right femoral neck fracture. 2.  Abductor tendon tear, right hip.  S/p 1. Right total hip arthroplasty, anterior approach.  2.  Abductor tendon repair, right  hip.  She is on asa bid per ortho for post op DVT prophylaxis.  "WBAT with walker Hip abduction brace at all times No active abduction for 6 weeks DVT ppx: ASA, SCDs, TEDs PO pain control PT/OT"    SURGEON: Rod Can, MD on 11/27      Procedures:  Right hip surgery on 11/27  Consultations:  orthopedics  Discharge Exam: BP (!) 148/84 (BP Location: Left Arm)   Pulse 75   Temp 98.1 F (36.7 C) (Oral)   Resp 15   Ht 5\' 10"  (1.778 m)   Wt 84.4 kg (186 lb)   SpO2 98%   BMI 26.69 kg/m   General: NAD Cardiovascular: RRR Respiratory: CTABL Right hip: post op changes, hip abduction brace in place, neurovascular intact distally  Discharge Instructions You were cared for by a hospitalist during your hospital stay. If you have any questions about your discharge medications or the care you received while you were in the hospital after you are discharged, you can call the unit and asked to speak with the hospitalist on call if the hospitalist that took care of you is not available. Once you are discharged, your primary care physician will handle any further medical issues. Please note that NO REFILLS for any discharge medications will be authorized once you are discharged, as it is imperative that you return to your primary care physician (or establish a relationship with a primary care physician if you do not  have one) for your aftercare needs so that they can reassess your need for medications and monitor your lab values.  Discharge Instructions    DME Bedside commode   Complete by:  As directed    Patient needs a bedside commode to treat with the following condition:  Hip fracture ([place])   Diet general   Complete by:  As directed    Face-to-face encounter (required for Medicare/Medicaid patients)   Complete by:  As directed    I Florencia Reasons certify that this patient is under my care and that I, or a nurse practitioner or physician's assistant working with me, had a  face-to-face encounter that meets the physician face-to-face encounter requirements with this patient on 08/24/2017. The encounter with the patient was in whole, or in part for the following medical condition(s) which is the primary reason for home health care (List medical condition): FTT   The encounter with the patient was in whole, or in part, for the following medical condition, which is the primary reason for home health care:  FTT   I certify that, based on my findings, the following services are medically necessary home health services:  Physical therapy   Reason for Medically Necessary Home Health Services:  Therapy- Personnel officer, Public librarian   My clinical findings support the need for the above services:  Pain interferes with ambulation/mobility   Further, I certify that my clinical findings support that this patient is homebound due to:  Unable to leave home safely without assistance   Home Health   Complete by:  As directed    To provide the following care/treatments:   PT OT     Increase activity slowly   Complete by:  As directed    Walker rolling   Complete by:  As directed      Allergies as of 08/24/2017      Reactions   Morphine Itching      Medication List    TAKE these medications   aspirin 81 MG chewable tablet Chew 1 tablet (81 mg total) by mouth 2 (two) times daily with a meal.   HYDROcodone-acetaminophen 5-325 MG tablet Commonly known as:  NORCO/VICODIN Take 1-2 tablets by mouth every 6 (six) hours as needed (breakthrough pain).   ketoconazole 2 % shampoo Commonly known as:  NIZORAL Apply 1 application topically daily as needed.   naproxen sodium 220 MG tablet Commonly known as:  ALEVE Take 220 mg by mouth 2 (two) times daily as needed.   senna-docusate 8.6-50 MG tablet Commonly known as:  Senokot-S Take 1 tablet by mouth 2 (two) times daily.            Durable Medical Equipment  (From admission, onward)         Start     Ordered   08/24/17 0000  DME Bedside commode    Question:  Patient needs a bedside commode to treat with the following condition  Answer:  Hip fracture ([place])   08/24/17 1220     Allergies  Allergen Reactions  . Morphine Itching   Follow-up Information    S[place], Aaron Edelman, MD. Schedule an appointment as soon as possible for a visit in 2 week(s).   Specialty:  Orthopedic Surgery Why:  For wound re-check Contact information: [address] [place] [address] [phone]        [facility] Follow up.   Specialty:  Home Health Services Why:  physical therapy Contact information:  [address] [place] [address] [phone]        [facility] - Dme Follow up.   Why:  walker and bedside commode Contact information: [place] [address] [phone]        Binnie Rail, MD Follow up in 1 week(s).   Specialty:  Internal Medicine Why:  hospital discharge follow up Contact information: [place] [address] [phone]            The results of significant diagnostics from this hospitalization (including imaging, microbiology, ancillary and laboratory) are listed below for reference.    Significant Diagnostic Studies: Pelvis Portable  Result Date: 08/22/2017 CLINICAL DATA:  Status post right total hip arthroplasty. EXAM: PORTABLE PELVIS 1-2 VIEWS COMPARISON:  Fluoroscopic images of same day. FINDINGS: The femoral and acetabular components appear to be well situated. Expected postoperative changes are seen surrounding soft tissues. No fracture or dislocation is noted. IMPRESSION: Status post right total hip arthroplasty. Electronically Signed   By: Marijo Conception, M.D.   On: 08/22/2017 15:53   Dg Chest Port 1 View  Result Date: 08/21/2017 CLINICAL DATA:  Fall with right hip fracture EXAM: PORTABLE CHEST 1 VIEW COMPARISON:  02/25/2015 FINDINGS: Zipper artifact over  the upper chest. No acute consolidation or pleural effusion. Borderline to mild cardiomegaly. Aortic atherosclerosis. No pneumothorax. IMPRESSION: 1. Borderline to mild cardiomegaly. 2. No radiographic evidence for acute cardiopulmonary abnormality Electronically Signed   By: Donavan Foil M.D.   On: 08/21/2017 20:27   Dg Knee Complete 4 Views Right  Result Date: 08/21/2017 CLINICAL DATA:  Hip fracture EXAM: RIGHT KNEE - COMPLETE 4+ VIEW COMPARISON:  08/21/2017 FINDINGS: Views of the right knee are limited by positioning. Mild lateral positioning of the patella, suspected to be secondary to positioning. No fracture. No large knee effusion IMPRESSION: 1. Limited study secondary to positioning 2. No fracture seen 3. Lateral positioning of the patella, suspect that this is largely related to positioning Electronically Signed   By: Donavan Foil M.D.   On: 08/21/2017 20:26   Dg C-arm 61-120 Min-no Report  Result Date: 08/22/2017 Fluoroscopy was utilized by the requesting physician.  No radiographic interpretation.   Dg Hip Operative Unilat W Or W/o Pelvis Right  Result Date: 08/22/2017 CLINICAL DATA:  Total right hip arthroplasty. EXAM: DG C-ARM 61-120 MIN-NO REPORT; OPERATIVE RIGHT HIP WITH PELVIS COMPARISON:  08/21/2017 FLUOROSCOPY TIME:  Fluoroscopy Time:  24 seconds. FINDINGS: Intraoperative fluoroscopic guidance was provided during total right hip arthroplasty. Two fluoroscopic images demonstrates placement of 3 component right hip prosthesis without evidence of fracture or immediate complications. The alignment is normal. IMPRESSION: Fluoroscopic guidance during total right hip arthroplasty without evidence of immediate complications. Electronically Signed   By: Fidela Salisbury M.D.   On: 08/22/2017 15:13   Dg Hip Unilat  With Pelvis 2-3 Views Right  Result Date: 08/21/2017 CLINICAL DATA:  Fall with right hip pain EXAM: DG HIP (WITH OR WITHOUT PELVIS) 2-3V RIGHT COMPARISON:  None. FINDINGS:  Metallic artifacts due to overlying clothing. Pubic symphysis and rami appear intact. The left femoral head projects in joint. Acute right femoral neck fracture with mild cephalad migration of the femoral trochanter. Femoral head projects in joint. IMPRESSION: Acute right femoral neck fracture. Electronically Signed   By: Donavan Foil M.D.   On: 08/21/2017 18:39    Microbiology: Recent Results (from the past 240 hour(s))  Surgical pcr screen  Status: None   Collection Time: 08/21/17 10:35 PM  Result Value Ref Range Status   MRSA, PCR NEGATIVE NEGATIVE Final   Staphylococcus aureus NEGATIVE NEGATIVE Final    Comment: (NOTE) The Xpert SA Assay (FDA approved for NASAL specimens in patients 31 years of age and older), is one component of a comprehensive surveillance program. It is not intended to diagnose infection nor to guide or monitor treatment.      Labs: Basic Metabolic Panel: Recent Labs  Lab 08/21/17 1751 08/23/17 0608 08/24/17 0552  NA 141 137 140  K 4.2 4.6 4.4  CL 108 108 109  CO2 27 25 28   GLUCOSE 101* 126* 101*  BUN 17 13 18   CREATININE 0.91 0.76 0.85  CALCIUM 9.1 8.4* 8.4*   Liver Function Tests: No results for input(s): AST, ALT, ALKPHOS, BILITOT, PROT, ALBUMIN in the last 168 hours. No results for input(s): LIPASE, AMYLASE in the last 168 hours. No results for input(s): AMMONIA in the last 168 hours. CBC: Recent Labs  Lab 08/21/17 1751 08/23/17 0608 08/24/17 0552  WBC 3.7* 5.2 4.7  NEUTROABS 2.4  --   --   HGB 13.1 11.0* 10.1*  HCT 37.8 32.1* 29.5*  MCV 95.0 94.7 95.5  PLT 225 194 176   Cardiac Enzymes: No results for input(s): CKTOTAL, CKMB, CKMBINDEX, TROPONINI in the last 168 hours. BNP: BNP (last 3 results) No results for input(s): BNP in the last 8760 hours.  ProBNP (last 3 results) No results for input(s): PROBNP in the last 8760 hours.  CBG: No results for input(s): GLUCAP in the last 168 hours.     Signed:  Florencia Reasons MD,  PhD  [facility] 08/24/2017, 12:30 PM

## 2017-08-24 NOTE — Telephone Encounter (Signed)
Pt was on the TCM list called pt to set up hosp f/u appt no answer LMOM RTC...Elizabeth Wilkins

## 2017-08-24 NOTE — Progress Notes (Signed)
   08/24/17 1100  OT Visit Information  Last OT Received On 08/24/17  Assistance Needed +1  History of Present Illness Pt s/p fall with R hip fx and anterior direct THP to repair.  Pt also requiring abductor tendon repair  Precautions  Precautions Fall  Required Braces or Orthoses Other Brace/Splint  Other Brace/Splint hip abductor brace at all times  Pain Assessment  Pain Score 3  Pain Location R hip  Pain Descriptors / Indicators Sore  Pain Intervention(s) Limited activity within patient's tolerance;Monitored during session;Premedicated before session;Repositioned;Ice applied  Cognition  Arousal/Alertness Awake/alert  Behavior During Therapy WFL for tasks assessed/performed  Overall Cognitive Status Within Functional Limits for tasks assessed  ADL  Lower Body Bathing Minimal assistance;Sit to/from stand;With adaptive equipment  Lower Body Dressing Minimal assistance;Sit to/from stand;With adaptive equipment  Toilet Transfer Minimal assistance;BSC;RW;Stand-pivot  General ADL Comments practiced with AE from seated position.  Merry Proud from [facility] came to adjust brace (had called due to it being twisted and had difficulty straightening)  practiced transfer to standard commode seat  Bed Mobility  General bed mobility comments oob  Restrictions  RLE Weight Bearing WBAT  Transfers  Equipment used Rolling walker (2 wheeled)  Sit to Stand Min assist  General transfer comment multiple sit to stands:  pt needs cues to get LLE under her and extend RLE.  She was able to verbalize sequence for sitting  OT - End of Session  Activity Tolerance Patient tolerated treatment well  Patient left in chair;with call bell/phone within reach  OT Assessment/Plan  OT Visit Diagnosis Pain  Pain - Right/Left Right  Pain - part of body Hip  OT Frequency (ACUTE ONLY) Min 2X/week  Follow Up Recommendations Home health OT  OT Equipment 3 in 1 bedside commode  AM-PAC OT "6 Clicks" Daily Activity Outcome Measure   Help from another person eating meals? 4  Help from another person taking care of personal grooming? 3  Help from another person toileting, which includes using toliet, bedpan, or urinal? 3  Help from another person bathing (including washing, rinsing, drying)? 3  Help from another person to put on and taking off regular upper body clothing? 3  Help from another person to put on and taking off regular lower body clothing? 3  6 Click Score 19  ADL G Code Conversion CK  OT Goal Progression  Progress towards OT goals Progressing toward goals  OT Time Calculation  OT Start Time (ACUTE ONLY) 1006  OT Stop Time (ACUTE ONLY) 1035 ([employer] rep present for part of tx)  OT Time Calculation (min) 29 min  OT General Charges  $OT Visit 1 Visit  OT Treatments  $Self Care/Home Management  8-22 mins  Lesle Chris, OTR/L [phone] 08/24/2017

## 2017-08-24 NOTE — Progress Notes (Signed)
Physical Therapy Treatment Patient Details Name: Elizabeth Wilkins MRN: [id number] DOB: [birth year] Today's Date: 08/24/2017    History of Present Illness Pt s/p fall with R hip fx and anterior direct THP to repair.  Pt also requiring abductor tendon repair    PT Comments    POD #2, am session   Ambulated 50 feet with RW, requiring VC's for sequencing, posture, and positioning to not place walker too far forward. Patient stated husband would be there to pick her up later for discharge.    Follow Up Recommendations  Home health PT     Equipment Recommendations  Rolling walker with 5" wheels    Recommendations for Other Services       Precautions / Restrictions Precautions Precautions: Fall Required Braces or Orthoses: Other Brace/Splint Other Brace/Splint: hip abductor brace at all times Restrictions Weight Bearing Restrictions: No RLE Weight Bearing: Weight bearing as tolerated    Mobility  Bed Mobility               General bed mobility comments: oob  Transfers Overall transfer level: Needs assistance Equipment used: Rolling walker (2 wheeled) Transfers: Sit to/from Stand Sit to Stand: Min assist         General transfer comment: 25% VC's for sequence and to manage LE's   Ambulation/Gait Ambulation/Gait assistance: Min guard Ambulation Distance (Feet): 50 Feet Assistive device: Rolling walker (2 wheeled) Gait Pattern/deviations: Step-to pattern;Decreased step length - left;Decreased step length - right;Shuffle Gait velocity: decreased Gait velocity interpretation: Below normal speed for age/gender General Gait Details: 25% VC's for sequencing, position of, and posture with RW.    Stairs            Wheelchair Mobility    Modified Rankin (Stroke Patients Only)       Balance                                            Cognition Arousal/Alertness: Awake/alert Behavior During Therapy: WFL for tasks  assessed/performed Overall Cognitive Status: Within Functional Limits for tasks assessed                                        Exercises      General Comments        Pertinent Vitals/Pain Pain Assessment: 0-10 Pain Score: 3  Pain Location: R hip Pain Descriptors / Indicators: Sore Pain Intervention(s): Limited activity within patient's tolerance;Monitored during session;Repositioned;Ice applied    Home Living                      Prior Function            PT Goals (current goals can now be found in the care plan section)      Frequency    7X/week      PT Plan      Co-evaluation              AM-PAC PT "6 Clicks" Daily Activity  Outcome Measure  Difficulty turning over in bed (including adjusting bedclothes, sheets and blankets)?: Unable Difficulty moving from lying on back to sitting on the side of the bed? : Unable Difficulty sitting down on and standing up from a chair with arms (e.g., wheelchair, bedside commode, etc,.)?: Unable Help needed  moving to and from a bed to chair (including a wheelchair)?: A Little Help needed walking in hospital room?: A Little Help needed climbing 3-5 steps with a railing? : A Little 6 Click Score: 12    End of Session Equipment Utilized During Treatment: Gait belt;Other (comment) Activity Tolerance: Patient tolerated treatment well Patient left: in chair;with call bell/phone within reach;with chair alarm set Nurse Communication: Mobility status PT Visit Diagnosis: Unsteadiness on feet (R26.81);Difficulty in walking, not elsewhere classified (R26.2)     Time: 9628-3662 PT Time Calculation (min) (ACUTE ONLY): 15 min  Charges:  $Gait Training: 8-22 mins                    G Codes:       [name], SPTA [place] [place]  PTA [facility]  Acute  Rehab Pager      [phone]

## 2017-08-25 ENCOUNTER — Telehealth: Payer: Self-pay | Admitting: Internal Medicine

## 2017-08-25 NOTE — Telephone Encounter (Signed)
Ordered by stefanie

## 2017-08-25 NOTE — Telephone Encounter (Signed)
Please advise 

## 2017-08-25 NOTE — Telephone Encounter (Signed)
Pt  Requesting a  rx  For  Automated  Compression  Stockings

## 2017-08-25 NOTE — Telephone Encounter (Signed)
Copied from [place] [id number]. Topic: Inquiry >> Aug 25, 2017 10:01 AM Oliver Pila B wrote: Reason for CRM: pt wanted practice to know that she has made an appt, but she is asking for a Rx for automated compression stockings, contact pt if needed

## 2017-08-25 NOTE — Telephone Encounter (Signed)
Transition Care Management Follow-up Telephone Call   Date discharged? 08/24/2017  How have you been since you were released from the hospital? Resting  Do you understand why you were in the hospital? Yes  Do you understand the discharge instructions? yes  Where were you discharged to? Home  Items Reviewed:  Medications reviewed: Yes  Allergies reviewed: Yes  Dietary changes reviewed: Yes  Referrals reviewed: Yes  Functional Questionnaire:   Activities of Daily Living (ADLs):   States they are independent in the following: Not able to do any ADL's States they require assistance with the following: Needs help with all ADL's. Does have a walker but it is very difficult.    Any transportation issues/concerns?: Concerned with getting brace fixed.    Any patient concerns? Only the pressure machine.    Confirmed importance and date/time of follow-up visits scheduled: Yes  Provider Appointment booked with PCP on 09/04/2017.   Confirmed with patient if condition begins to worsen call PCP or go to the ER.  Patient was given the office number and encouraged to call back with question or concerns: Yes  Pt requested order for compression machine. This order has been placed per PCP verbal okay.

## 2017-08-26 DIAGNOSIS — M8580 Other specified disorders of bone density and structure, unspecified site: Secondary | ICD-10-CM | POA: Diagnosis not present

## 2017-08-26 DIAGNOSIS — W19XXXD Unspecified fall, subsequent encounter: Secondary | ICD-10-CM | POA: Diagnosis not present

## 2017-08-26 DIAGNOSIS — S72001D Fracture of unspecified part of neck of right femur, subsequent encounter for closed fracture with routine healing: Secondary | ICD-10-CM | POA: Diagnosis not present

## 2017-08-26 DIAGNOSIS — Z9181 History of falling: Secondary | ICD-10-CM | POA: Diagnosis not present

## 2017-08-26 DIAGNOSIS — Z96641 Presence of right artificial hip joint: Secondary | ICD-10-CM | POA: Diagnosis not present

## 2017-08-26 DIAGNOSIS — R627 Adult failure to thrive: Secondary | ICD-10-CM | POA: Diagnosis not present

## 2017-08-27 ENCOUNTER — Emergency Department (HOSPITAL_BASED_OUTPATIENT_CLINIC_OR_DEPARTMENT_OTHER)
Admit: 2017-08-27 | Discharge: 2017-08-27 | Disposition: A | Discharge status: Home / Self Care | Payer: Medicare Other | Attending: Emergency Medicine | Admitting: Emergency Medicine

## 2017-08-27 ENCOUNTER — Emergency Department (HOSPITAL_COMMUNITY)
Admission: EM | Admit: 2017-08-27 | Discharge: 2017-08-27 | Disposition: A | Discharge status: Home / Self Care | Payer: Medicare Other | Attending: Emergency Medicine | Admitting: Emergency Medicine

## 2017-08-27 ENCOUNTER — Encounter (HOSPITAL_COMMUNITY): Payer: Self-pay | Admitting: Emergency Medicine

## 2017-08-27 DIAGNOSIS — Z96641 Presence of right artificial hip joint: Secondary | ICD-10-CM | POA: Insufficient documentation

## 2017-08-27 DIAGNOSIS — R2241 Localized swelling, mass and lump, right lower limb: Secondary | ICD-10-CM | POA: Diagnosis not present

## 2017-08-27 DIAGNOSIS — I10 Essential (primary) hypertension: Secondary | ICD-10-CM | POA: Insufficient documentation

## 2017-08-27 DIAGNOSIS — R609 Edema, unspecified: Secondary | ICD-10-CM

## 2017-08-27 DIAGNOSIS — M7989 Other specified soft tissue disorders: Secondary | ICD-10-CM

## 2017-08-27 DIAGNOSIS — R600 Localized edema: Secondary | ICD-10-CM | POA: Diagnosis not present

## 2017-08-27 MED ORDER — HYDROCODONE-ACETAMINOPHEN 5-325 MG PO TABS
1.0000 | ORAL_TABLET | Freq: Once | ORAL | Status: AC
  Administered 2017-08-27: 1 via ORAL
  Filled 2017-08-27: qty 1

## 2017-08-27 NOTE — ED Triage Notes (Signed)
Pt reports she has had R calf swelling for the past couple days. No SOB.  Recently had R hip surgery, was discharged from hospital on Thursday. Has not been able to ambulate due to complications with orthopedic brace.

## 2017-08-27 NOTE — ED Notes (Signed)
US at bedside

## 2017-08-27 NOTE — Progress Notes (Signed)
VASCULAR LAB PRELIMINARY  PRELIMINARY  PRELIMINARY  PRELIMINARY  Right lower extremity venous duplex completed.    Preliminary report:  There is no DVT or SVT noted in the right lower extremity.  Gave results to Dr. Tyson Dense, [facility], RVT 08/27/2017, 7:09 PM

## 2017-08-27 NOTE — ED Provider Notes (Signed)
[facility] DEPT Provider Note   CSN: [id number] Arrival date & time: 08/27/17  1351     History   Chief Complaint Chief Complaint  Patient presents with  . Leg Swelling    HPI Elizabeth Wilkins is a 71 y.o. female.  Patient complains of swelling and minimal discomfort to right calf.  She had surgery on her right hip recently and has developed the swelling    Leg Pain   This is a new problem. The current episode started 2 days ago. The problem occurs constantly. The problem has not changed since onset.Pain location: Right calf. The quality of the pain is described as aching. The pain is at a severity of 2/10. The pain is mild. Pertinent negatives include no numbness. The symptoms are aggravated by contact. She has tried nothing for the symptoms. The treatment provided no relief.    Past Medical History:  Diagnosis Date  . Hyperlipidemia   . Hypertension   . Osteopenia     Patient Active Problem List   Diagnosis Date Noted  . Pre-op evaluation 08/22/2017  . Closed displaced fracture of right femoral neck ([place]) 08/22/2017  . Hip fracture ([place]) 08/21/2017  . 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 Herpes simplex 03/30/2017  . Vitamin D deficiency 03/30/2017  . Decreased hearing 03/30/2017  . Laceration of forearm 02/09/2017  . Rectal polyp 02/12/2016  . Hyperglycemia 11/26/2015  . Seborrheic dermatitis 05/14/2012  . Osteopenia 11/18/2010  . Dyslipidemia 11/09/2010  . BURSITIS, RIGHT HIP 11/09/2010  . Elevated blood pressure reading 11/08/2010    Past Surgical History:  Procedure Laterality Date  . broken wrist Right    plates and screws  . COLONOSCOPY    . 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 TUBAL LIGATION      OB History    No data  available       Home Medications    Prior to Admission medications   Medication Sig Start Date End Date Taking? Authorizing Provider  aspirin 81 MG chewable tablet Chew 1 tablet (81 mg total) by mouth 2 (two) times daily with a meal. 08/23/17   Swinteck, Aaron Edelman, MD  HYDROcodone-acetaminophen (NORCO/VICODIN) 5-325 MG tablet Take 1-2 tablets by mouth every 6 (six) hours as needed (breakthrough pain). 08/23/17   Swinteck, Aaron Edelman, MD  ketoconazole (NIZORAL) 2 % shampoo Apply 1 application topically daily as needed. 05/22/17   [provider]  naproxen sodium (ALEVE) 220 MG tablet Take 220 mg by mouth 2 (two) times daily as needed.    [provider]  senna-docusate (SENOKOT-S) 8.6-50 MG tablet Take 1 tablet by mouth 2 (two) times daily. 08/24/17   Florencia Reasons, MD    Family History Family History  Problem Relation Age of Onset  . Pancreatic cancer Mother 90  . Hypertension Mother   . Hyperlipidemia Mother   . Rectal cancer Brother 77  . Lupus Father 94  . Colon cancer Neg Hx   . Stomach cancer Neg Hx     Social History Social History   Tobacco Use  . Smoking status: Never Smoker  . Smokeless tobacco: Never Used  Substance Use Topics  . Alcohol use: Yes    Alcohol/week: 12.6 oz    Types: 14 Glasses of wine, 7 Shots of liquor per week  Comment: 2-3 drinks daily  . Drug use: No     Allergies   Morphine   Review of Systems Review of Systems  Constitutional: Negative for appetite change and fatigue.  HENT: Negative for congestion, ear discharge and sinus pressure.   Eyes: Negative for discharge.  Respiratory: Negative for cough.   Cardiovascular: Negative for chest pain.  Gastrointestinal: Negative for abdominal pain and diarrhea.  Genitourinary: Negative for frequency and hematuria.  Musculoskeletal: Negative for back pain.       Minimal swelling right calf  Skin: Negative for rash.  Neurological: Negative for seizures, numbness and headaches.    Psychiatric/Behavioral: Negative for hallucinations.     Physical Exam Updated Vital Signs BP (!) 154/65   Pulse 66   Temp 98.4 F (36.9 C) (Oral)   Resp 20   SpO2 97%   Physical Exam  Constitutional: She is oriented to person, place, and time. She appears well-developed.  HENT:  Head: Normocephalic.  Eyes: Conjunctivae and EOM are normal. No scleral icterus.  Neck: Neck supple. No thyromegaly present.  Cardiovascular: Normal rate and regular rhythm. Exam reveals no gallop and no friction rub.  No murmur heard. Pulmonary/Chest: No stridor. She has no wheezes. She has no rales. She exhibits no tenderness.  Abdominal: She exhibits no distension. There is no tenderness. There is no rebound.  Musculoskeletal: Normal range of motion. She exhibits no edema.  Patient with surgery to right hip that is healing well.  Patient has mild swelling tenderness to right calf.  Neurovascular exam normal  Lymphadenopathy:    She has no cervical adenopathy.  Neurological: She is oriented to person, place, and time. She exhibits normal muscle tone. Coordination normal.  Skin: No rash noted. No erythema.  Psychiatric: She has a normal mood and affect. Her behavior is normal.     ED Treatments / Results  Labs (all labs ordered are listed, but only abnormal results are displayed) Labs Reviewed - No data to display  EKG  EKG Interpretation None       Radiology No results found.  Procedures Procedures (including critical care time)  Medications Ordered in ED Medications  HYDROcodone-acetaminophen (NORCO/VICODIN) 5-325 MG per tablet 1 tablet (1 tablet Oral Given 08/27/17 1705)     Initial Impression / Assessment and Plan / ED Course  I have reviewed the triage vital signs and the nursing notes.  Pertinent labs & imaging results that were available during my care of the patient were reviewed by me and considered in my medical decision making (see chart for details).  Patient had a  vascular study of her right calf and leg that was negative for DVT.  She will follow-up with orthopedics  Final Clinical Impressions(s) / ED Diagnoses   Final diagnoses:  Peripheral edema    ED Discharge Orders    None       Milton Ferguson, MD 08/27/17 1900

## 2017-08-27 NOTE — Discharge Instructions (Signed)
Follow-up with the orthopedic surgeon as planned this week

## 2017-08-28 ENCOUNTER — Ambulatory Visit: Payer: Medicare Other | Admitting: Internal Medicine

## 2017-08-29 DIAGNOSIS — W19XXXD Unspecified fall, subsequent encounter: Secondary | ICD-10-CM | POA: Diagnosis not present

## 2017-08-29 DIAGNOSIS — S72001D Fracture of unspecified part of neck of right femur, subsequent encounter for closed fracture with routine healing: Secondary | ICD-10-CM | POA: Diagnosis not present

## 2017-08-29 DIAGNOSIS — Z9181 History of falling: Secondary | ICD-10-CM | POA: Diagnosis not present

## 2017-08-29 DIAGNOSIS — R627 Adult failure to thrive: Secondary | ICD-10-CM | POA: Diagnosis not present

## 2017-08-29 DIAGNOSIS — Z96641 Presence of right artificial hip joint: Secondary | ICD-10-CM | POA: Diagnosis not present

## 2017-08-29 DIAGNOSIS — M8580 Other specified disorders of bone density and structure, unspecified site: Secondary | ICD-10-CM | POA: Diagnosis not present

## 2017-09-01 DIAGNOSIS — Z9181 History of falling: Secondary | ICD-10-CM | POA: Diagnosis not present

## 2017-09-01 DIAGNOSIS — Z96641 Presence of right artificial hip joint: Secondary | ICD-10-CM | POA: Diagnosis not present

## 2017-09-01 DIAGNOSIS — W19XXXD Unspecified fall, subsequent encounter: Secondary | ICD-10-CM | POA: Diagnosis not present

## 2017-09-01 DIAGNOSIS — S72001D Fracture of unspecified part of neck of right femur, subsequent encounter for closed fracture with routine healing: Secondary | ICD-10-CM | POA: Diagnosis not present

## 2017-09-01 DIAGNOSIS — M8580 Other specified disorders of bone density and structure, unspecified site: Secondary | ICD-10-CM | POA: Diagnosis not present

## 2017-09-01 DIAGNOSIS — R627 Adult failure to thrive: Secondary | ICD-10-CM | POA: Diagnosis not present

## 2017-09-01 NOTE — Telephone Encounter (Signed)
Called pt to inform that [facility] is not able to dispense CPM originally requested by pt during TCM call. Pt stated that she is feeling much better and that the swelling is completely gone.

## 2017-09-04 ENCOUNTER — Ambulatory Visit: Payer: Medicare Other | Admitting: Internal Medicine

## 2017-09-06 DIAGNOSIS — W19XXXD Unspecified fall, subsequent encounter: Secondary | ICD-10-CM | POA: Diagnosis not present

## 2017-09-06 DIAGNOSIS — Z9181 History of falling: Secondary | ICD-10-CM | POA: Diagnosis not present

## 2017-09-06 DIAGNOSIS — M8580 Other specified disorders of bone density and structure, unspecified site: Secondary | ICD-10-CM | POA: Diagnosis not present

## 2017-09-06 DIAGNOSIS — R627 Adult failure to thrive: Secondary | ICD-10-CM | POA: Diagnosis not present

## 2017-09-06 DIAGNOSIS — Z96641 Presence of right artificial hip joint: Secondary | ICD-10-CM | POA: Diagnosis not present

## 2017-09-06 DIAGNOSIS — S72001D Fracture of unspecified part of neck of right femur, subsequent encounter for closed fracture with routine healing: Secondary | ICD-10-CM | POA: Diagnosis not present

## 2017-09-09 DIAGNOSIS — Z9181 History of falling: Secondary | ICD-10-CM | POA: Diagnosis not present

## 2017-09-09 DIAGNOSIS — R627 Adult failure to thrive: Secondary | ICD-10-CM | POA: Diagnosis not present

## 2017-09-09 DIAGNOSIS — Z96641 Presence of right artificial hip joint: Secondary | ICD-10-CM | POA: Diagnosis not present

## 2017-09-09 DIAGNOSIS — S72001D Fracture of unspecified part of neck of right femur, subsequent encounter for closed fracture with routine healing: Secondary | ICD-10-CM | POA: Diagnosis not present

## 2017-09-09 DIAGNOSIS — M8580 Other specified disorders of bone density and structure, unspecified site: Secondary | ICD-10-CM | POA: Diagnosis not present

## 2017-09-09 DIAGNOSIS — W19XXXD Unspecified fall, subsequent encounter: Secondary | ICD-10-CM | POA: Diagnosis not present

## 2017-09-10 NOTE — Progress Notes (Signed)
Subjective:    Patient ID: Elizabeth Wilkins, female    DOB: 03-06-1946, 71 y.o.   MRN: [id number]  HPI The patient is here for follow up from the hospital.  Admitted 08/21/17 - 08/24/17.   Recommendations for Outpatient Follow-up:  1. F/u with PMD within a week  for hospital discharge follow up, repeat cbc/bmp at follow up 2. F/u with orthopedics Dr Lyla Glassing in two weeks  She went to the ED after a fall.  She was cutting holly and walking into the RV when she was looking down and fell.  She injured her hip and knew it was broken.  She was not able to move or lift her leg.  Xray showed a closed fracture of the right femoral neck. She had a right total hip arthroplasty, anterior approach and a repair of the abductor tendon.  She was discharged home with a walker, home PT and vicodin for the pain.  She was started on 162 mg of aspirin, SCDs and TEDs for DVT prophylaxis.    She went to the ED on 08/27/17 for swelling and discomfort in the right calf.  It started two days prior.  The pain was mild, 2/10.  An [place] was negative for a DVT.    Her hip pain is minimal now 1-2/10 w/o pain medication.  She is following the restrictions and instructions provided by orthopedics.  She has completed physical therapy.  She is using a walker and her orthopedic does not want her to go to a cane.  She is wearing a brace so that she does not abduct her right leg.  The swelling in her right lower extremity has resolved.  She has not needed pain medication recently.   Elevated blood pressure: BP at home has been 120/70.   Both physical therapy and she has checked it.  She is currently not on any medication.  She denies any chest pain, palpitations and shortness of breath.  Chronic headaches, chronic right-sided sinus pain.  She has had an infected tooth a few months ago that was treated by her dentist.  The infection is supposed to be completely gone.  Since that time she has had chronic right-sided sinus pain and  headaches.  She has a follow-up with her dentist in a few weeks.  She may need an implant.  Osteopenia, high FRAX: Her last bone density was 2016.  She had severe osteopenia at that time and a high refracts score.  She was on Fosamax but did not tolerate it.  She has not been on any other treatment.  She has not had a bone density since then.  Medications and allergies reviewed with patient and updated if appropriate.  Patient Active Problem List   Diagnosis Date Noted  . Pre-op evaluation 08/22/2017  . Closed displaced fracture of right femoral neck ([place]) 08/22/2017  . Hip fracture ([place]) 08/21/2017  . 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 Herpes simplex 03/30/2017  . Vitamin D deficiency 03/30/2017  . Decreased hearing 03/30/2017  . Laceration of forearm 02/09/2017  . Rectal polyp 02/12/2016  . Hyperglycemia 11/26/2015  . Seborrheic dermatitis 05/14/2012  . Osteopenia 11/18/2010  . Dyslipidemia 11/09/2010  . BURSITIS, RIGHT HIP 11/09/2010  . Elevated blood pressure reading 11/08/2010    Current Outpatient Medications on File Prior to Visit  Medication Sig Dispense Refill  . aspirin 81 MG chewable tablet Chew 1 tablet (81 mg total) by mouth 2 (two) times daily with a meal.  60 tablet 1  . ketoconazole (NIZORAL) 2 % shampoo Apply 1 application topically daily as needed.  2  . senna-docusate (SENOKOT-S) 8.6-50 MG tablet Take 1 tablet by mouth 2 (two) times daily. 14 tablet 0   No current facility-administered medications on file prior to visit.     Past Medical History:  Diagnosis Date  . Hyperlipidemia   . Hypertension   . Osteopenia     Past Surgical History:  Procedure Laterality Date  . broken wrist Right    plates and screws  . COLONOSCOPY    . TOTAL HIP ARTHROPLASTY Right 08/22/2017   Procedure: TOTAL HIP ARTHROPLASTY ANTERIOR APPROACH AND ABDUCTOR TENDON REPAIR;  Surgeon: Rod Can, MD;  Location: [facility];  Service: Orthopedics;  Laterality: Right;   . TUBAL LIGATION      Social History   Socioeconomic History  . Marital status: Married    Spouse name: None  . Number of children: 3  . Years of education: None  . Highest education level: None  Social Needs  . Financial resource strain: None  . Food insecurity - worry: None  . Food insecurity - inability: None  . Transportation needs - medical: None  . Transportation needs - non-medical: None  Occupational History  . Occupation: retired  Tobacco Use  . Smoking status: Never Smoker  . Smokeless tobacco: Never Used  Substance and Sexual Activity  . Alcohol use: Yes    Alcohol/week: 12.6 oz    Types: 14 Glasses of wine, 7 Shots of liquor per week    Comment: 2-3 drinks daily  . Drug use: No  . Sexual activity: None  Other Topics Concern  . 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      No regular exercise    Family History  Problem Relation Age of Onset  . Pancreatic cancer Mother 33  . Hypertension Mother   . Hyperlipidemia Mother   . Rectal cancer Brother 63  . Lupus Father 49  . Colon cancer Neg Hx   . Stomach cancer Neg Hx     Review of Systems  Constitutional: Positive for chills (one episode). Negative for fever.  HENT: Positive for sinus pressure (right sided since  tooth infection). Negative for congestion.   Respiratory: Negative for cough, shortness of breath and wheezing.   Cardiovascular: Negative for chest pain, palpitations and leg swelling.  Neurological: Positive for headaches. Negative for dizziness and light-headedness.       Objective:   Vitals:   09/11/17 1305  BP: (!) 154/82  Pulse: 73  Resp: 16  Temp: 98.3 F (36.8 C)  SpO2: 96%   Wt Readings from Last 3 Encounters:  09/11/17 184 lb (83.5 kg)  08/21/17 186 lb (84.4 kg)  03/30/17 187 lb (84.8 kg)   Body mass index is 26.4  kg/m.   Physical Exam    Constitutional: Appears well-developed and well-nourished. No distress.  HENT:  Head: Normocephalic and atraumatic.  Neck: Neck supple. No tracheal deviation present. No thyromegaly present.  No cervical lymphadenopathy Cardiovascular: Normal rate, regular rhythm and normal heart sounds.   No murmur heard. No carotid bruit .  No edema Pulmonary/Chest: Effort normal and breath sounds normal. No respiratory distress. No has no wheezes. No rales.  Neuro: CN 2-12 intact, non focal neuro exam. Skin: Skin is warm and  dry. Not diaphoretic.  Psychiatric: Normal mood and affect. Behavior is normal.      Assessment & Plan:    See Problem List for Assessment and Plan of chronic medical problems.

## 2017-09-11 ENCOUNTER — Encounter: Payer: Self-pay | Admitting: Internal Medicine

## 2017-09-11 ENCOUNTER — Ambulatory Visit (INDEPENDENT_AMBULATORY_CARE_PROVIDER_SITE_OTHER): Payer: Medicare Other | Admitting: Internal Medicine

## 2017-09-11 VITALS — BP 154/82 | HR 73 | Temp 98.3°F | Resp 16 | Wt 184.0 lb

## 2017-09-11 DIAGNOSIS — R030 Elevated blood-pressure reading, without diagnosis of hypertension: Secondary | ICD-10-CM

## 2017-09-11 DIAGNOSIS — R519 Headache, unspecified: Secondary | ICD-10-CM | POA: Insufficient documentation

## 2017-09-11 DIAGNOSIS — R51 Headache: Secondary | ICD-10-CM | POA: Diagnosis not present

## 2017-09-11 DIAGNOSIS — M85859 Other specified disorders of bone density and structure, unspecified thigh: Secondary | ICD-10-CM | POA: Diagnosis not present

## 2017-09-11 DIAGNOSIS — G8929 Other chronic pain: Secondary | ICD-10-CM

## 2017-09-11 DIAGNOSIS — J3489 Other specified disorders of nose and nasal sinuses: Secondary | ICD-10-CM

## 2017-09-11 DIAGNOSIS — S72001A Fracture of unspecified part of neck of right femur, initial encounter for closed fracture: Secondary | ICD-10-CM | POA: Diagnosis not present

## 2017-09-11 NOTE — Assessment & Plan Note (Signed)
Last dexa 2016 - had high FRAX Just fractured her right hip Warrants treatment - discussed prolia - did not tolerated fosamax in past Will discuss with dentist - may need a dental implant Start calcium and vitamin d daily

## 2017-09-11 NOTE — Assessment & Plan Note (Signed)
Elevated here but has been well controlled at home Continue to monitor  No new medication today

## 2017-09-11 NOTE — Assessment & Plan Note (Signed)
S/p surgical repair Walking with walker per ortho Following with ortho Completed PT Pain minimal 1-2/10

## 2017-09-11 NOTE — Assessment & Plan Note (Signed)
Had infected tooth - dentist treated infection but has had chronic right sided sinus pain and chronic headache since Possible chronic sinus infection or residual tooth infection CT of sinuses ordered

## 2017-09-11 NOTE — Assessment & Plan Note (Signed)
Right side only - chronic in nature Had infected tooth - dentist treated infection but has had chronic right sided sinus pain and chronic headache since Possible chronic sinus infection or residual tooth infection CT of sinuses ordered

## 2017-09-11 NOTE — Patient Instructions (Addendum)
Start taking calcium and vitamin - 600 mg of calcium twice daily and 1000-2000 units a day.  A Ct scan of your sinuses was ordered.     Denosumab injection  (prolia) What is this medicine? DENOSUMAB (den oh sue mab) slows bone breakdown. Prolia is used to treat osteoporosis in women after menopause and in men. Delton See is used to treat a high calcium level due to cancer and to prevent bone fractures and other bone problems caused by multiple myeloma or cancer bone metastases. Delton See is also used to treat giant cell tumor of the bone. This medicine may be used for other purposes; ask your health care provider or pharmacist if you have questions. COMMON BRAND NAME(S): Prolia, XGEVA What should I tell my health care provider before I take this medicine? They need to know if you have any of these conditions: -dental disease -having surgery or tooth extraction -infection -kidney disease -low levels of calcium or Vitamin D in the blood -malnutrition -on hemodialysis -skin conditions or sensitivity -thyroid or parathyroid disease -an unusual reaction to denosumab, other medicines, foods, dyes, or preservatives -pregnant or trying to get pregnant -breast-feeding How should I use this medicine? This medicine is for injection under the skin. It is given by a health care professional in a hospital or clinic setting. If you are getting Prolia, a special MedGuide will be given to you by the pharmacist with each prescription and refill. Be sure to read this information carefully each time. For Prolia, talk to your pediatrician regarding the use of this medicine in children. Special care may be needed. For Delton See, talk to your pediatrician regarding the use of this medicine in children. While this drug may be prescribed for children as young as 13 years for selected conditions, precautions do apply. Overdosage: If you think you have taken too much of this medicine contact a poison control center or  emergency room at once. NOTE: This medicine is only for you. Do not share this medicine with others. What if I miss a dose? It is important not to miss your dose. Call your doctor or health care professional if you are unable to keep an appointment. What may interact with this medicine? Do not take this medicine with any of the following medications: -other medicines containing denosumab This medicine may also interact with the following medications: -medicines that lower your chance of fighting infection -steroid medicines like prednisone or cortisone This list may not describe all possible interactions. Give your health care provider a list of all the medicines, herbs, non-prescription drugs, or dietary supplements you use. Also tell them if you smoke, drink alcohol, or use illegal drugs. Some items may interact with your medicine. What should I watch for while using this medicine? Visit your doctor or health care professional for regular checks on your progress. Your doctor or health care professional may order blood tests and other tests to see how you are doing. Call your doctor or health care professional for advice if you get a fever, chills or sore throat, or other symptoms of a cold or flu. Do not treat yourself. This drug may decrease your body's ability to fight infection. Try to avoid being around people who are sick. You should make sure you get enough calcium and vitamin D while you are taking this medicine, unless your doctor tells you not to. Discuss the foods you eat and the vitamins you take with your health care professional. See your dentist regularly. Brush and floss your  teeth as directed. Before you have any dental work done, tell your dentist you are receiving this medicine. Do not become pregnant while taking this medicine or for 5 months after stopping it. Talk with your doctor or health care professional about your birth control options while taking this medicine. Women  should inform their doctor if they wish to become pregnant or think they might be pregnant. There is a potential for serious side effects to an unborn child. Talk to your health care professional or pharmacist for more information. What side effects may I notice from receiving this medicine? Side effects that you should report to your doctor or health care professional as soon as possible: -allergic reactions like skin rash, itching or hives, swelling of the face, lips, or tongue -bone pain -breathing problems -dizziness -jaw pain, especially after dental work -redness, blistering, peeling of the skin -signs and symptoms of infection like fever or chills; cough; sore throat; pain or trouble passing urine -signs of low calcium like fast heartbeat, muscle cramps or muscle pain; pain, tingling, numbness in the hands or feet; seizures -unusual bleeding or bruising -unusually weak or tired Side effects that usually do not require medical attention (report to your doctor or health care professional if they continue or are bothersome): -constipation -diarrhea -headache -joint pain -loss of appetite -muscle pain -runny nose -tiredness -upset stomach This list may not describe all possible side effects. Call your doctor for medical advice about side effects. You may report side effects to FDA at 1-800-FDA-1088. Where should I keep my medicine? This medicine is only given in a clinic, doctor's office, or other health care setting and will not be stored at home. NOTE: This sheet is a summary. It may not cover all possible information. If you have questions about this medicine, talk to your doctor, pharmacist, or health care provider.  2018 Elsevier/Gold Standard (2016-10-04 19:17:21)

## 2017-09-20 ENCOUNTER — Ambulatory Visit (INDEPENDENT_AMBULATORY_CARE_PROVIDER_SITE_OTHER)
Admission: RE | Admit: 2017-09-20 | Discharge: 2017-09-20 | Disposition: A | Discharge status: Home / Self Care | Payer: Medicare Other | Source: Ambulatory Visit | Attending: Internal Medicine | Admitting: Internal Medicine

## 2017-09-20 DIAGNOSIS — J3489 Other specified disorders of nose and nasal sinuses: Secondary | ICD-10-CM

## 2017-09-20 DIAGNOSIS — R51 Headache: Secondary | ICD-10-CM | POA: Diagnosis not present

## 2017-09-20 DIAGNOSIS — R519 Headache, unspecified: Secondary | ICD-10-CM

## 2017-09-30 ENCOUNTER — Encounter: Payer: Self-pay | Admitting: Internal Medicine

## 2017-10-04 ENCOUNTER — Ambulatory Visit: Payer: Medicare Other | Admitting: Physical Therapy

## 2017-10-04 DIAGNOSIS — Z96641 Presence of right artificial hip joint: Secondary | ICD-10-CM | POA: Diagnosis not present

## 2017-10-04 DIAGNOSIS — Z471 Aftercare following joint replacement surgery: Secondary | ICD-10-CM | POA: Diagnosis not present

## 2017-10-04 DIAGNOSIS — S72031D Displaced midcervical fracture of right femur, subsequent encounter for closed fracture with routine healing: Secondary | ICD-10-CM | POA: Diagnosis not present

## 2017-10-09 ENCOUNTER — Encounter: Payer: Self-pay | Admitting: Internal Medicine

## 2017-10-10 ENCOUNTER — Ambulatory Visit: Payer: Medicare Other | Attending: Internal Medicine | Admitting: Physical Therapy

## 2017-10-10 ENCOUNTER — Encounter: Payer: Self-pay | Admitting: Physical Therapy

## 2017-10-10 DIAGNOSIS — M6281 Muscle weakness (generalized): Secondary | ICD-10-CM | POA: Insufficient documentation

## 2017-10-10 DIAGNOSIS — R2689 Other abnormalities of gait and mobility: Secondary | ICD-10-CM
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
## 2017-10-16 ENCOUNTER — Telehealth: Payer: Self-pay | Admitting: Emergency Medicine

## 2017-10-16 DIAGNOSIS — J3489 Other specified disorders of nose and nasal sinuses: Secondary | ICD-10-CM

## 2017-10-16 NOTE — Telephone Encounter (Signed)
Referral ordered for ENT 

## 2017-10-16 NOTE — Telephone Encounter (Signed)
Copied from [place]. Topic: Inquiry >> Oct 16, 2017  2:39 PM Vernona Rieger wrote: Patient wants the nurse to call her back.  Pain in her face and around nostril. Had CT scan. Still having pain. Needs to see someone in regards to this. She went to the dentist & they couldn't do anything for her  Call back is [phone]

## 2017-10-16 NOTE — Telephone Encounter (Signed)
I think pt was seen for this in Dec

## 2017-10-17 NOTE — Telephone Encounter (Signed)
LVM informing pt

## 2017-10-23 ENCOUNTER — Telehealth: Payer: Self-pay | Admitting: Emergency Medicine

## 2017-10-23 NOTE — Telephone Encounter (Signed)
Copied from [place] [id number]. Topic: Referral - Status >> Oct 19, 2017  3:49 PM Bea Graff, NT wrote: Reason for CRM: Referral was sent on 10/16/17 to a ENT pt checking status of when she might receive a call for the appt and where to referral was sent.   >> Oct 23, 2017  1:55 PM Aurelio Brash B wrote: PT is asking for the office to send  Ct scan to the ent office she was referred to.

## 2017-10-23 NOTE — Telephone Encounter (Signed)
Spoke with pt, she has an appt tomorrow with [facility] ENT. CT scan results faxed [phone]

## 2017-10-24 DIAGNOSIS — H6123 Impacted cerumen, bilateral: Secondary | ICD-10-CM | POA: Diagnosis not present

## 2017-10-24 DIAGNOSIS — K1379 Other lesions of oral mucosa: Secondary | ICD-10-CM | POA: Diagnosis not present

## 2017-10-24 DIAGNOSIS — G8929 Other chronic pain: Secondary | ICD-10-CM | POA: Diagnosis not present

## 2017-10-24 DIAGNOSIS — K0889 Other specified disorders of teeth and supporting structures: Secondary | ICD-10-CM | POA: Diagnosis not present

## 2017-11-09 ENCOUNTER — Encounter: Payer: Self-pay | Admitting: Family Medicine

## 2017-11-09 ENCOUNTER — Ambulatory Visit (INDEPENDENT_AMBULATORY_CARE_PROVIDER_SITE_OTHER): Payer: Medicare Other | Admitting: Family Medicine

## 2017-11-09 VITALS — BP 122/76 | HR 63 | Temp 98.2°F | Ht 70.0 in | Wt 181.0 lb

## 2017-11-09 DIAGNOSIS — S0512XA Contusion of eyeball and orbital tissues, left eye, initial encounter: Secondary | ICD-10-CM

## 2017-11-09 NOTE — Patient Instructions (Signed)
Please try to ice the area. If you have changes in her vision, significant pain, or drainage then please seek immediate care.

## 2017-11-09 NOTE — Assessment & Plan Note (Signed)
Denies trauma. No changes in vision. No deficits in her eye movement. Does take an 81 mg aspirin which could be the source of the bruising. - Counseled on supportive care - Provided indications to seek immediate care.

## 2017-11-09 NOTE — Progress Notes (Signed)
Elizabeth Wilkins - 72 y.o. female MRN [id number]  Date of birth: 12-12-1945  SUBJECTIVE:  Including CC & ROS.  Chief Complaint  Patient presents with  . Eye Problem    Elizabeth Wilkins is a 72 y.o. female that is presenting with eye bruise. It is located on the inner corner of her left eye. She noticed it this afternoon after she was working in her outside. Denies injury or insect bites. Denies tenderness.  She has been having sinus pressure for several months. She denies any changes in her vision. She does take 81 mg aspirin on a daily basis. She denies any bleeding disorder.  Review of Systems  Constitutional: Negative for fever.  Eyes: Negative for photophobia, pain and visual disturbance.  Respiratory: Negative for cough.   Cardiovascular: Negative for chest pain.  Hematological: Negative for adenopathy. Bruises/bleeds easily.    HISTORY: Past Medical, Surgical, Social, and Family History Reviewed & Updated per EMR.   Pertinent Historical Findings include:  Past Medical History:  Diagnosis Date  . Hyperlipidemia   . Hypertension   . Osteopenia     Past Surgical History:  Procedure Laterality Date  . broken wrist Right    plates and screws  . COLONOSCOPY    . 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 TUBAL LIGATION      Allergies  Allergen Reactions  . Morphine Itching    Family History  Problem Relation Age of Onset  . Pancreatic cancer Mother 49  . Hypertension Mother   . Hyperlipidemia Mother   . Rectal cancer Brother 72  . Lupus Father 25  . Colon cancer Neg Hx   . Stomach cancer Neg Hx      Social History   Socioeconomic History  . Marital status: Married    Spouse name: Not on file  . Number of children: 3  . Years of education: Not on file  . Highest education level: Not on  file  Social Needs  . Financial resource strain: Not on file  . Food insecurity - worry: Not on file  . Food insecurity - inability: Not on file  . Transportation needs - medical: Not on file  . Transportation needs - non-medical: Not on file  Occupational History  . Occupation: retired  Tobacco Use  . Smoking status: Never Smoker  . Smokeless tobacco: Never Used  Substance and Sexual Activity  . Alcohol use: Yes    Alcohol/week: 12.6 oz    Types: 14 Glasses of wine, 7 Shots of liquor per week    Comment: 2-3 drinks daily  . Drug use: No  . Sexual activity: Not on file  Other Topics Concern  . 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      No regular exercise     PHYSICAL EXAM:  VS: BP 122/76 (BP Location: Left Arm, Patient Position: Sitting, Cuff Size: Normal)   Pulse 63   Temp 98.2 F (36.8 C) (Oral)   Ht 5\' 10"  (1.778 m)   Wt 181 lb (82.1 kg)   SpO2 99%   BMI 25.97 kg/m  Physical Exam Gen: NAD, alert, cooperative with exam, well-appearing ENT: normal lips,  normal nasal mucosa,  Eye: normal EOM, normal conjunctiva, ecchymosis occurring on the medial canthus CV:  no edema, +2 pedal pulses   Resp: no accessory muscle use, non-labored,  Skin: no rashes, no areas of induration  Neuro: normal tone, normal sensation to touch Psych:  normal insight, alert and oriented MSK: Normal strength, normal gait      ASSESSMENT & PLAN:   Ecchymosis of left eye Denies trauma. No changes in vision. No deficits in her eye movement. Does take an 81 mg aspirin which could be the source of the bruising. - Counseled on supportive care - Provided indications to seek immediate care.

## 2017-11-18 ENCOUNTER — Emergency Department (HOSPITAL_COMMUNITY)
Admission: EM | Admit: 2017-11-18 | Discharge: 2017-11-18 | Disposition: A | Discharge status: Home / Self Care | Payer: Medicare Other | Attending: Emergency Medicine | Admitting: Emergency Medicine

## 2017-11-18 ENCOUNTER — Encounter (HOSPITAL_COMMUNITY): Payer: Self-pay | Admitting: Emergency Medicine

## 2017-11-18 ENCOUNTER — Other Ambulatory Visit: Payer: Self-pay

## 2017-11-18 ENCOUNTER — Emergency Department (HOSPITAL_COMMUNITY): Payer: Medicare Other

## 2017-11-18 DIAGNOSIS — R1030 Lower abdominal pain, unspecified: Secondary | ICD-10-CM | POA: Diagnosis not present

## 2017-11-18 DIAGNOSIS — I10 Essential (primary) hypertension: Secondary | ICD-10-CM | POA: Insufficient documentation

## 2017-11-18 DIAGNOSIS — R1031 Right lower quadrant pain: Secondary | ICD-10-CM

## 2017-11-18 DIAGNOSIS — Z7982 Long term (current) use of aspirin: Secondary | ICD-10-CM | POA: Insufficient documentation

## 2017-11-18 DIAGNOSIS — Z96641 Presence of right artificial hip joint: Secondary | ICD-10-CM | POA: Diagnosis not present

## 2017-11-18 LAB — CBC WITH DIFFERENTIAL/PLATELET
Basophils Absolute: 0 10*3/uL (ref 0.0–0.1)
Basophils Relative: 0 %
Eosinophils Absolute: 0 10*3/uL (ref 0.0–0.7)
Eosinophils Relative: 0 %
HEMATOCRIT: 40.4 % (ref 36.0–46.0)
Hemoglobin: 14.1 g/dL (ref 12.0–15.0)
LYMPHS PCT: 31 %
Lymphs Abs: 1.4 10*3/uL (ref 0.7–4.0)
MCH: 31.5 pg (ref 26.0–34.0)
MCHC: 34.9 g/dL (ref 30.0–36.0)
MCV: 90.2 fL (ref 78.0–100.0)
MONO ABS: 0.4 10*3/uL (ref 0.1–1.0)
MONOS PCT: 8 %
NEUTROS ABS: 2.8 10*3/uL (ref 1.7–7.7)
Neutrophils Relative %: 61 %
Platelets: 225 10*3/uL (ref 150–400)
RBC: 4.48 MIL/uL (ref 3.87–5.11)
RDW: 12.6 % (ref 11.5–15.5)
WBC: 4.6 10*3/uL (ref 4.0–10.5)

## 2017-11-18 LAB — URINALYSIS, ROUTINE W REFLEX MICROSCOPIC
BILIRUBIN URINE: NEGATIVE
Glucose, UA: NEGATIVE mg/dL
HGB URINE DIPSTICK: NEGATIVE
Ketones, ur: 5 mg/dL — AB
Nitrite: NEGATIVE
Protein, ur: NEGATIVE mg/dL
SPECIFIC GRAVITY, URINE: 1.014 (ref 1.005–1.030)
pH: 8 (ref 5.0–8.0)

## 2017-11-18 LAB — COMPREHENSIVE METABOLIC PANEL
ALBUMIN: 4.3 g/dL (ref 3.5–5.0)
ALT: 32 U/L (ref 14–54)
ANION GAP: 11 (ref 5–15)
AST: 29 U/L (ref 15–41)
Alkaline Phosphatase: 81 U/L (ref 38–126)
BUN: 11 mg/dL (ref 6–20)
CHLORIDE: 103 mmol/L (ref 101–111)
CO2: 21 mmol/L — AB (ref 22–32)
Calcium: 9.6 mg/dL (ref 8.9–10.3)
Creatinine, Ser: 0.89 mg/dL (ref 0.44–1.00)
GFR calc Af Amer: >60 mL/min (ref >60)
GFR calc non Af Amer: >60 mL/min (ref >60)
Glucose, Bld: 97 mg/dL (ref 65–99)
POTASSIUM: 4.8 mmol/L (ref 3.5–5.1)
SODIUM: 135 mmol/L (ref 135–145)
TOTAL PROTEIN: 6.8 g/dL (ref 6.5–8.1)
Total Bilirubin: 1 mg/dL (ref 0.3–1.2)

## 2017-11-18 MED ORDER — METHOCARBAMOL 500 MG PO TABS
500.0000 mg | ORAL_TABLET | Freq: Once | ORAL | Status: DC
  Filled 2017-11-18: qty 1

## 2017-11-18 MED ORDER — FENTANYL CITRATE (PF) 100 MCG/2ML IJ SOLN
50.0000 ug | INTRAMUSCULAR | Status: DC | PRN
  Administered 2017-11-18: 50 ug via INTRAVENOUS
  Filled 2017-11-18: qty 2

## 2017-11-18 MED ORDER — OXYCODONE-ACETAMINOPHEN 5-325 MG PO TABS
1.0000 | ORAL_TABLET | Freq: Once | ORAL | Status: AC
  Administered 2017-11-18: 1 via ORAL
  Filled 2017-11-18: qty 1

## 2017-11-18 MED ORDER — METHOCARBAMOL 500 MG PO TABS: 500.0000 mg | ORAL_TABLET | Freq: Three times a day (TID) | ORAL | 0 refills | Status: DC | PRN

## 2017-11-18 MED ORDER — IOPAMIDOL (ISOVUE-300) INJECTION 61%
INTRAVENOUS | Status: AC
  Administered 2017-11-18: 100 mL via INTRAVENOUS
  Filled 2017-11-18: qty 100

## 2017-11-18 MED ORDER — HYDROMORPHONE HCL 1 MG/ML IJ SOLN
1.0000 mg | Freq: Once | INTRAMUSCULAR | Status: AC
  Administered 2017-11-18: 1 mg via INTRAVENOUS
  Filled 2017-11-18: qty 1

## 2017-11-18 MED ORDER — SODIUM CHLORIDE 0.9 % IV SOLN
INTRAVENOUS | Status: DC
  Administered 2017-11-18: 13:00:00 via INTRAVENOUS

## 2017-11-18 NOTE — ED Notes (Signed)
Pt. Stated, its morphine that makes me itch.

## 2017-11-18 NOTE — Discharge Instructions (Signed)
Your CT scan does not show any specific abnormal findings as does your blood work which was also normal.  Robaxin 3 times daily as needed for muscle spasm Continue your follow up with your orthopedist ER for worsening pain / vomiting / fevers

## 2017-11-18 NOTE — ED Notes (Signed)
Husband comes to me stated, she is having more pain and spasms, she needs something for pain.Elizabeth Wilkins

## 2017-11-18 NOTE — ED Triage Notes (Signed)
Pt. Stated, I had a hip replacement  In Nov. And Ive had groin pain on the right and travels up to my stomach. I took a Vicodin last night. I sleep all night without pain. My doctor said it was tendon pain.

## 2017-11-18 NOTE — ED Provider Notes (Signed)
[facility] Provider Note   CSN: [id number] Arrival date & time: 11/18/17  6010     History   Chief Complaint Chief Complaint  Patient presents with  . Groin Pain  . Abdominal Pain    HPI Elizabeth Wilkins is a 72 y.o. female.  HPI  The patient is a 72 year old female, she has a history of a right right femoral neck fracture, she went to surgery within the last couple of months, during the surgery she reports that they had to reattach a ligament that had come off.  Since that time she has had some intermittent pain in the right inguinal region, there is some radiation into the right abdomen in the right lower back though this is rare, she thinks it may be somewhat positional but sometimes it is not, she has lost her appetite this week and has not wanted to eat very much.  She denies fevers chills nausea vomiting diarrhea dysuria or hematuria.  She has been seen by her orthopedist who thinks that she may have some tendinitis.  She has a follow-up with him coming up.  She has been taking pain medication at home including hydrocodone which she states gives her temporary relief.  Also taking Naprosyn.  Past Medical History:  Diagnosis Date  . Hyperlipidemia   . Hypertension   . Osteopenia     Patient Active Problem List   Diagnosis Date Noted  . Ecchymosis of left eye 11/09/2017  . Chronic nonintractable headache 09/11/2017  . Sinus pain 09/11/2017  . Pre-op evaluation 08/22/2017  . Closed displaced fracture of right femoral neck ([place]) 08/22/2017  . Hip fracture ([place]) 08/21/2017  . 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 Herpes simplex 03/30/2017  . Vitamin D deficiency 03/30/2017  . Decreased hearing 03/30/2017  . Hyperglycemia 11/26/2015  . Seborrheic dermatitis 05/14/2012  . Osteopenia 11/18/2010  . Dyslipidemia 11/09/2010  . BURSITIS, RIGHT HIP 11/09/2010  . Elevated blood pressure reading 11/08/2010    Past Surgical  History:  Procedure Laterality Date  . broken wrist Right    plates and screws  . COLONOSCOPY    . 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 TUBAL LIGATION      OB History    No data available       Home Medications    Prior to Admission medications   Medication Sig Start Date End Date Taking? Authorizing Provider  aspirin 81 MG chewable tablet Chew 1 tablet (81 mg total) by mouth 2 (two) times daily with a meal. 08/23/17   Swinteck, Aaron Edelman, MD  ketoconazole (NIZORAL) 2 % shampoo Apply 1 application topically daily as needed. 05/22/17   [provider]  methocarbamol (ROBAXIN) 500 MG tablet Take 1 tablet (500 mg total) by mouth 3 (three) times daily as needed for up to 10 days for muscle spasms. 11/18/17 11/28/17  Noemi Chapel, MD    Family History Family History  Problem Relation Age of Onset  . Pancreatic cancer Mother 80  . Hypertension Mother   . Hyperlipidemia Mother   . Rectal cancer Brother 38  . Lupus Father 46  . Colon cancer Neg Hx   . Stomach cancer Neg Hx     Social History Social History   Tobacco Use  . Smoking status: Never Smoker  . Smokeless  tobacco: Never Used  Substance Use Topics  . Alcohol use: Yes    Alcohol/week: 12.6 oz    Types: 14 Glasses of wine, 7 Shots of liquor per week    Comment: 2-3 drinks daily  . Drug use: No     Allergies   Morphine   Review of Systems Review of Systems  All other systems reviewed and are negative.    Physical Exam Updated Vital Signs BP (!) 174/89   Pulse 68   Temp (!) 97.5 F (36.4 C) (Oral)   Resp 18   Ht 5\' 10"  (1.778 m)   Wt 78.5 kg (173 lb)   SpO2 100%   BMI 24.82 kg/m   Physical Exam  Constitutional: She appears well-developed and well-nourished. No distress.  HENT:  Head: Normocephalic and atraumatic.    Mouth/Throat: Oropharynx is clear and moist. No oropharyngeal exudate.  Eyes: Conjunctivae and EOM are normal. Pupils are equal, round, and reactive to light. Right eye exhibits no discharge. Left eye exhibits no discharge. No scleral icterus.  Neck: Normal range of motion. Neck supple. No JVD present. No thyromegaly present.  Cardiovascular: Normal rate, regular rhythm, normal heart sounds and intact distal pulses. Exam reveals no gallop and no friction rub.  No murmur heard. Pulmonary/Chest: Effort normal and breath sounds normal. No respiratory distress. She has no wheezes. She has no rales.  Abdominal: Soft. Bowel sounds are normal. She exhibits no distension and no mass. There is no tenderness.  Animal tenderness to the right lower quadrant in the right inguinal region, normal pulses at the femoral artery, no lymphadenopathy.  Normal pulses at the right foot.  Musculoskeletal: Normal range of motion. She exhibits no edema or tenderness.  There is no restricted range of motion about the right hip, it is supple to internal and external rotation as well as flexion and extension.  She has no tenderness with resistance against forced flexion or extension.  Knee and ankle joints are unremarkable.  Lymphadenopathy:    She has no cervical adenopathy.  Neurological: She is alert. Coordination normal.  Skin: Skin is warm and dry. No rash noted. No erythema.  Psychiatric: She has a normal mood and affect. Her behavior is normal.  Nursing note and vitals reviewed.    ED Treatments / Results  Labs (all labs ordered are listed, but only abnormal results are displayed) Labs Reviewed  COMPREHENSIVE METABOLIC PANEL - Abnormal; Notable for the following components:      Result Value   CO2 21 (*)    All other components within normal limits  URINALYSIS, ROUTINE W REFLEX MICROSCOPIC - Abnormal; Notable for the following components:   Ketones, ur 5 (*)    Leukocytes, UA TRACE (*)    Bacteria, UA RARE  (*)    Squamous Epithelial / LPF 0-5 (*)    All other components within normal limits  CBC WITH DIFFERENTIAL/PLATELET     Radiology Ct Abdomen Pelvis W Contrast  Result Date: 11/18/2017 CLINICAL DATA:  Right-sided groin pain. Hip replacement in November with pain since then. Severe pain today. EXAM: CT ABDOMEN AND PELVIS WITH CONTRAST TECHNIQUE: Multidetector CT imaging of the abdomen and pelvis was performed using the standard protocol following bolus administration of intravenous contrast. CONTRAST:  127mL ISOVUE-300 IOPAMIDOL (ISOVUE-300) INJECTION 61% COMPARISON:  Radiography 08/22/2017 FINDINGS: Lower chest: Mild linear scarring at the lung bases. No pleural fluid. Hepatobiliary: Normal appearance of the liver. No calcified gallstones. Pancreas: Normal Spleen: Normal Adrenals/Urinary Tract: Adrenal glands are normal.  Kidneys are normal. No cyst, mass, stone or hydronephrosis. Bladder is normal. Stomach/Bowel: No abnormal bowel finding. No evidence of obstruction. No inflammatory changes. Appendix is normal. No evidence of hernia. Vascular/Lymphatic: Aortic atherosclerosis. No aneurysm. IVC is normal. No retroperitoneal adenopathy. No right pelvic or proximal femoral vascular abnormality is seen. Reproductive: Uterus and adnexal regions appear normal. No pelvic mass. Other: No free fluid or air. Musculoskeletal: Ordinary moderate lower lumbar degenerative changes. No sign of complication relating to the right hip arthroplasty. Other bones of the right pelvis appear negative. IMPRESSION: No cause of the presenting symptoms is identified. No evidence of abdominal or pelvic organ pathology. No CT abnormality relating to the right hip arthroplasty. See above for full discussion. Electronically Signed   By: Nelson Chimes M.D.   On: 11/18/2017 15:37    Procedures Procedures (including critical care time)  Medications Ordered in ED Medications  0.9 %  sodium chloride infusion ( Intravenous New  Bag/Given 11/18/17 1244)  fentaNYL (SUBLIMAZE) injection 50 mcg (50 mcg Intravenous Given 11/18/17 1243)  methocarbamol (ROBAXIN) tablet 500 mg (not administered)  oxyCODONE-acetaminophen (PERCOCET/ROXICET) 5-325 MG per tablet 1 tablet (1 tablet Oral Given 11/18/17 1016)  HYDROmorphone (DILAUDID) injection 1 mg (1 mg Intravenous Given 11/18/17 1325)  iopamidol (ISOVUE-300) 61 % injection (100 mLs Intravenous Contrast Given 11/18/17 1420)     Initial Impression / Assessment and Plan / ED Course  I have reviewed the triage vital signs and the nursing notes.  Pertinent labs & imaging results that were available during my care of the patient were reviewed by me and considered in my medical decision making (see chart for details).     Overall the patient is well-appearing with a nonsurgical abdomen but with progressive intermittent symptoms in the right lower quadrant and right inguinal region will obtain a CT scan to rule out small hernia, appendicitis or other source of the patient's pain including kidney stone.  She has a fairly normal musculoskeletal exam of the hip and I do not think this is necessarily related to the joint.  Certainly she has a supple joint making septic arthritis much less likely.  Fentanyl as needed IVF Labs and UA with CT  CT negative - pt informed Stable for d/c. Pt agreeable.  Final Clinical Impressions(s) / ED Diagnoses   Final diagnoses:  Groin pain, right    ED Discharge Orders        Ordered    methocarbamol (ROBAXIN) 500 MG tablet  3 times daily PRN     11/18/17 1551       Noemi Chapel, MD 11/18/17 1554

## 2017-11-20 ENCOUNTER — Ambulatory Visit: Payer: Self-pay | Admitting: *Deleted

## 2017-11-20 ENCOUNTER — Ambulatory Visit (INDEPENDENT_AMBULATORY_CARE_PROVIDER_SITE_OTHER): Payer: Medicare Other | Admitting: Internal Medicine

## 2017-11-20 ENCOUNTER — Telehealth: Payer: Self-pay | Admitting: Internal Medicine

## 2017-11-20 ENCOUNTER — Encounter: Payer: Self-pay | Admitting: Internal Medicine

## 2017-11-20 DIAGNOSIS — M25551 Pain in right hip: Secondary | ICD-10-CM | POA: Diagnosis not present

## 2017-11-20 MED ORDER — LIDOCAINE 5 % EX PTCH: 1.0000 | MEDICATED_PATCH | CUTANEOUS | 0 refills | Status: DC

## 2017-11-20 NOTE — Telephone Encounter (Signed)
   Reason for Disposition . [1] MODERATE pain (e.g., interferes with normal activities) AND [2] pain comes and goes (cramps) AND [3] present > 24 hours  (Exception: pain with Vomiting or Diarrhea - see that Guideline)  Answer Assessment - Initial Assessment Questions Seen at [facility] on 11/18/17 for Rt sided abdomen pain including groin area. Reports Catscan and bloodwork was negative. Took aleve this morning with no relief. BM normal and voiding today without difficulty. She reports she has issues with constipation over the years. The pain is in spasms that come and go. This morning the spasms have lasted an hour. Took Methocarbamol that was given at ED but it made her fingers draw up so she won't take any more.    1. LOCATION: "Where does it hurt?"      Rt lower abdomen pain including groin 2. RADIATION: "Does the pain shoot anywhere else?" (e.g., chest, back)    Sometimes to the Rt back side 3. ONSET: "When did the pain begin?" (e.g., minutes, hours or days ago)    Saturday 4. SUDDEN: "Gradual or sudden onset?"     Spasms come and go Since Saturday.  5. PATTERN "Does the pain come and go, or is it constant?"    - If constant: "Is it getting better, staying the same, or worsening?"      (Note: Constant means the pain never goes away completely; most serious pain is constant and it progresses)     - If intermittent: "How long does it last?" "Do you have pain now?"     (Note: Intermittent means the pain goes away completely between bouts)     On and off over last 3 days. 6. SEVERITY: "How bad is the pain?"  (e.g., Scale 1-10; mild, moderate, or severe)   - MILD (1-3): doesn't interfere with normal activities, abdomen soft and not tender to touch    - MODERATE (4-7): interferes with normal activities or awakens from sleep, tender to touch    - SEVERE (8-10): excruciating pain, doubled over, unable to do any normal activities     6-7 7. RECURRENT SYMPTOM: "Have you ever had this type of  abdominal pain before?" If so, ask: "When was the last time?" and "What happened that time?"      no 8. CAUSE: "What do you think is causing the abdominal pain?"     Maybe from a muscle that was involved with Full Rt hip replacement with a ligament that snapped also. Sx was Nov 27 9. RELIEVING/AGGRAVATING FACTORS: "What makes it better or worse?" (e.g., movement, antacids, bowel movement)     Tums this morning that helped. 10. OTHER SYMPTOMS: "Has there been any vomiting, diarrhea, constipation, or urine problems?"     Occasional constipation 11. PREGNANCY: "Is there any chance you are pregnant?" "When was your last menstrual period?"   no  Protocols used: ABDOMINAL PAIN - [facility]

## 2017-11-20 NOTE — Patient Instructions (Signed)
We have sent in the lidoderm patch for the pain for you.  I would follow up with the orthopedic doctor to make sure the tendons and muscles are okay.

## 2017-11-20 NOTE — Progress Notes (Signed)
   Subjective:    Patient ID: Elizabeth Wilkins, female    DOB: 1946-01-22, 72 y.o.   MRN: [id number]  HPI The patient is a 72 YO female coming in for right hip pain. This pain is sometimes radiating into the abdomen. First she states it has been going on for two weeks. Then she elaborates that this has been going on since her right replacement in November in which a tendon was repositioned during surgery. She has followed up once with the surgeon and not more. She had done some therapy and she is not sure if she is still doing that. She has been having this pain intermittent since that time but worsening possibly in the last 2 weeks. Then she states that she had two episodes in the last two weeks. Went to the ER and they did CT abdomen and pelvis which showed nothing abnormal. She was given muscle relaxers which helped the pain but caused some clawing of her left hand and so she did not take more than once. Her husband interjects here to complain about why she is not taking them now and why he feels her pain would be gone if she took the muscle relaxers as prescribed. They have not contacted their orthopedic surgeon. Worse with certain bending activities. Denies other abdominal symptoms such as diarrhea, constipation, blood in stool, nausea or vomiting.   Review of Systems  Constitutional: Positive for activity change. Negative for appetite change, chills, fatigue, fever and unexpected weight change.  HENT: Negative.   Eyes: Negative.   Respiratory: Negative.   Cardiovascular: Negative.   Gastrointestinal: Positive for abdominal pain. Negative for blood in stool, constipation, diarrhea, nausea and vomiting.  Musculoskeletal: Positive for arthralgias and myalgias. Negative for back pain, gait problem and joint swelling.  Skin: Negative.   Neurological: Negative.   Hematological: Negative.       Objective:   Physical Exam  Constitutional: She is oriented to person, place, and time. She appears  well-developed and well-nourished.  HENT:  Head: Normocephalic and atraumatic.  Eyes: EOM are normal.  Neck: Normal range of motion.  Cardiovascular: Normal rate and regular rhythm.  Pulmonary/Chest: Effort normal and breath sounds normal. No respiratory distress. She has no wheezes. She has no rales.  Abdominal: Soft. Bowel sounds are normal. She exhibits no distension. There is no tenderness. There is no rebound.  Pain in the right hip region and more over the pubic bone than the stomach, no radiation into the stomach during exam  Musculoskeletal: She exhibits no edema.  Neurological: She is alert and oriented to person, place, and time. Coordination normal.  Walked smoothly  Skin: Skin is warm and dry.   Vitals:   11/20/17 1331  BP: 138/88  Pulse: (!) 52  Temp: 98.7 F (37.1 C)  TempSrc: Oral  SpO2: 98%  Weight: 178 lb (80.7 kg)  Height: 5\' 10"  (1.778 m)      Assessment & Plan:  Visit time 25 minutes: greater than 50% of that time was spent in face to face counseling and coordination of care with the patient and her husband, counseling about the nature of her pain and likely causes and possible treatments.

## 2017-11-20 NOTE — Telephone Encounter (Signed)
Copied from [place] [id number]. Topic: Quick Communication - See Telephone Encounter >> Nov 20, 2017  2:40 PM Arletha Grippe wrote: CRM for notification. See Telephone encounter for:   11/20/17. Pt needs to have prior auth for lidocaine (LIDODERM) 5% Please call when this is covered for pt to pick up

## 2017-11-21 NOTE — Progress Notes (Signed)
Subjective:    Patient ID: Elizabeth Wilkins, female    DOB: 1946/09/11, 72 y.o.   MRN: [id number]  HPI The patient is here for an acute visit.   She just had dental surgery - implant and has not been able to eat.  The tooth was pulled last fall.  She was able to eat a little yesterday.  She thinks she was anxious about the surgery and she thinks that is why she could not eat.  She has f/u in two days.    She has right groin/hip pain from her hip replacement 3 months ago.   She has pain in her right groin and it radiates up to her abdomen and around to her back.  She went to the ED and no cause was found.  Imaging of her abdomen/pelvis was normal.  She was given a muscle relaxer.  Her fingers were drawing and contorting after she took it so she has not taken it again.  She spoke to ortho yesterday and they prescribed trmaadol.  She sees him on Friday.   Over the past few months there has been a lot of things going on and it has been building.  Her anxiety has been increasing.    She is very anxious.  Her sleep has been ok overall.  She was not eating for awhile and during that time she was not thinking clearly.  She denies depression.   A friend gave her some xanax yesterday before she went to the dentist office and it helped.  She wondered about something like this.    Medications and allergies reviewed with patient and updated if appropriate.  Patient Active Problem List   Diagnosis Date Noted  . Right hip pain 11/22/2017  . Ecchymosis of left eye 11/09/2017  . Chronic nonintractable headache 09/11/2017  . Sinus pain 09/11/2017  . Pre-op evaluation 08/22/2017  . Closed displaced fracture of right femoral neck ([place]) 08/22/2017  . Hip fracture ([place]) 08/21/2017  . 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 Herpes simplex 03/30/2017  . Vitamin D deficiency 03/30/2017  . Decreased hearing 03/30/2017  . Hyperglycemia 11/26/2015  . Seborrheic dermatitis 05/14/2012  . Osteopenia  11/18/2010  . Dyslipidemia 11/09/2010  . Elevated blood pressure reading 11/08/2010    Current Outpatient Medications on File Prior to Visit  Medication Sig Dispense Refill  . amoxicillin (AMOXIL) 500 MG capsule     . aspirin 81 MG chewable tablet Chew 1 tablet (81 mg total) by mouth 2 (two) times daily with a meal. 60 tablet 1  . chlorhexidine (PERIDEX) 0.12 % solution AFTER BRUSHING, RINSE WITH ONE CAPFUL FOR ONE MINUTE TWICE A DAY THEN SPIT OUT  0  . ketoconazole (NIZORAL) 2 % shampoo Apply 1 application topically daily as needed.  2  . methocarbamol (ROBAXIN) 500 MG tablet Take 1 tablet (500 mg total) by mouth 3 (three) times daily as needed for up to 10 days for muscle spasms. (Patient not taking: Reported on 11/20/2017) 30 tablet 0  . naproxen sodium (ANAPROX) 550 MG tablet     . traMADol (ULTRAM) 50 MG tablet tramadol 50 mg tablet  Take 1 tablet(s) EVERY 6 HOURS as needed for pain.     No current facility-administered medications on file prior to visit.     Past Medical History:  Diagnosis Date  . Hyperlipidemia   . Hypertension   . Osteopenia     Past Surgical History:  Procedure Laterality Date  .  broken wrist Right    plates and screws  . COLONOSCOPY    . 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 TUBAL LIGATION      Social History   Socioeconomic History  . Marital status: Married    Spouse name: None  . Number of children: 3  . Years of education: None  . Highest education level: None  Social Needs  . Financial resource strain: None  . Food insecurity - worry: None  . Food insecurity - inability: None  . Transportation needs - medical: None  . Transportation needs - non-medical: None  Occupational History  . Occupation: retired  Tobacco Use  . Smoking status: Never Smoker  .  Smokeless tobacco: Never Used  Substance and Sexual Activity  . Alcohol use: Yes    Alcohol/week: 12.6 oz    Types: 14 Glasses of wine, 7 Shots of liquor per week    Comment: 2-3 drinks daily  . Drug use: No  . Sexual activity: None  Other Topics Concern  . 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      No regular exercise    Family History  Problem Relation Age of Onset  . Pancreatic cancer Mother 19  . Hypertension Mother   . Hyperlipidemia Mother   . Rectal cancer Brother 78  . Lupus Father 22  . Colon cancer Neg Hx   . Stomach cancer Neg Hx     Review of Systems  Constitutional: Positive for appetite change (decreased).  Respiratory: Negative for shortness of breath.   Cardiovascular: Negative for chest pain and palpitations.  Musculoskeletal: Positive for arthralgias.  Psychiatric/Behavioral: Negative for dysphoric mood. The patient is nervous/anxious.        Objective:   Vitals:   11/22/17 1126  BP: (!) 144/88  Pulse: 76  Resp: 16  Temp: 98.2 F (36.8 C)  SpO2: 98%   Wt Readings from Last 3 Encounters:  11/22/17 175 lb (79.4 kg)  11/20/17 178 lb (80.7 kg)  11/18/17 173 lb (78.5 kg)   Body mass index is 25.11 kg/m.   Physical Exam  Constitutional: She appears well-developed and well-nourished. No distress.  HENT:  Head: Normocephalic and atraumatic.  Skin: Skin is warm and dry. She is not diaphoretic.  Psychiatric: Her behavior is normal. Judgment and thought content normal.  Very anxious, crying intermittently throughout visit        Assessment & Plan:    See Problem List for Assessment and Plan of chronic medical problems.

## 2017-11-22 ENCOUNTER — Ambulatory Visit (INDEPENDENT_AMBULATORY_CARE_PROVIDER_SITE_OTHER): Payer: Medicare Other | Admitting: Internal Medicine

## 2017-11-22 ENCOUNTER — Encounter: Payer: Self-pay | Admitting: Internal Medicine

## 2017-11-22 VITALS — BP 144/88 | HR 76 | Temp 98.2°F | Resp 16 | Wt 175.0 lb

## 2017-11-22 DIAGNOSIS — F419 Anxiety disorder, unspecified: Secondary | ICD-10-CM

## 2017-11-22 DIAGNOSIS — M25551 Pain in right hip: Secondary | ICD-10-CM | POA: Insufficient documentation

## 2017-11-22 MED ORDER — PAROXETINE HCL 10 MG PO TABS: 10.0000 mg | ORAL_TABLET | Freq: Every day | ORAL | 5 refills | Status: DC

## 2017-11-22 NOTE — Assessment & Plan Note (Signed)
Advised to stop the methocarbamol as she had possible side effect. Rx for lidoderm patches for pain. She is still taking some vicodin left over from surgery and advised anti-inflammatory instead and advised to contact her orthopedic surgeon for evaluation and possible injection for pain relief.

## 2017-11-22 NOTE — Telephone Encounter (Signed)
Pt is currently taking Tramadol given by ortho and seeing ortho on Friday.

## 2017-11-22 NOTE — Assessment & Plan Note (Addendum)
New  She is very anxious here and I do think she would benefit from medication - at first she did not want to take any, but agrees to try something - will try low dose paxil at 10 mg daily Will avoid BDZ - we both feel this would not be a good idea Discussed natural things she can try - lavender, CBD oil, meditation/deepbreathing, exercise Her anxiety will likely improve as her medical problems improve/resolve Consider a therapist

## 2017-11-22 NOTE — Patient Instructions (Addendum)
Try taking calm aid - a lavender supplement at [employer] on [address].  Try CBD oil.    Try essential oils.  Stay active.   Try deep breathing exercises.    Consider medication - we can try a daily medication - let me know if you want to try something.  You can take this for as long or as little as you want     Consider talking to a therapist.     Start the paxil 10 mg daily.       Generalized Anxiety Disorder, Adult Generalized anxiety disorder (GAD) is a mental health disorder. People with this condition constantly worry about everyday events. Unlike normal anxiety, worry related to GAD is not triggered by a specific event. These worries also do not fade or get better with time. GAD interferes with life functions, including relationships, work, and school. GAD can vary from mild to severe. People with severe GAD can have intense waves of anxiety with physical symptoms (panic attacks). What are the causes? The exact cause of GAD is not known. What increases the risk? This condition is more likely to develop in:  Women.  People who have a family history of anxiety disorders.  People who are very shy.  People who experience very stressful life events, such as the death of a loved one.  People who have a very stressful family environment.  What are the signs or symptoms? People with GAD often worry excessively about many things in their lives, such as their health and family. They may also be overly concerned about:  Doing well at work.  Being on time.  Natural disasters.  Friendships.  Physical symptoms of GAD include:  Fatigue.  Muscle tension or having muscle twitches.  Trembling or feeling shaky.  Being easily startled.  Feeling like your heart is pounding or racing.  Feeling out of breath or like you cannot take a deep breath.  Having trouble falling asleep or staying asleep.  Sweating.  Nausea, diarrhea, or irritable bowel syndrome  (IBS).  Headaches.  Trouble concentrating or remembering facts.  Restlessness.  Irritability.  How is this diagnosed? Your health care provider can diagnose GAD based on your symptoms and medical history. You will also have a physical exam. The health care provider will ask specific questions about your symptoms, including how severe they are, when they started, and if they come and go. Your health care provider may ask you about your use of alcohol or drugs, including prescription medicines. Your health care provider may refer you to a mental health specialist for further evaluation. Your health care provider will do a thorough examination and may perform additional tests to rule out other possible causes of your symptoms. To be diagnosed with GAD, a person must have anxiety that:  Is out of his or her control.  Affects several different aspects of his or her life, such as work and relationships.  Causes distress that makes him or her unable to take part in normal activities.  Includes at least three physical symptoms of GAD, such as restlessness, fatigue, trouble concentrating, irritability, muscle tension, or sleep problems.  Before your health care provider can confirm a diagnosis of GAD, these symptoms must be present more days than they are not, and they must last for six months or longer. How is this treated? The following therapies are usually used to treat GAD:  Medicine. Antidepressant medicine is usually prescribed for long-term daily control. Antianxiety medicines may be added in  severe cases, especially when panic attacks occur.  Talk therapy (psychotherapy). Certain types of talk therapy can be helpful in treating GAD by providing support, education, and guidance. Options include: ? Cognitive behavioral therapy (CBT). People learn coping skills and techniques to ease their anxiety. They learn to identify unrealistic or negative thoughts and behaviors and to replace them  with positive ones. ? Acceptance and commitment therapy (ACT). This treatment teaches people how to be mindful as a way to cope with unwanted thoughts and feelings. ? Biofeedback. This process trains you to manage your body's response (physiological response) through breathing techniques and relaxation methods. You will work with a therapist while machines are used to monitor your physical symptoms.  Stress management techniques. These include yoga, meditation, and exercise.  A mental health specialist can help determine which treatment is best for you. Some people see improvement with one type of therapy. However, other people require a combination of therapies. Follow these instructions at home:  Take over-the-counter and prescription medicines only as told by your health care provider.  Try to maintain a normal routine.  Try to anticipate stressful situations and allow extra time to manage them.  Practice any stress management or self-calming techniques as taught by your health care provider.  Do not punish yourself for setbacks or for not making progress.  Try to recognize your accomplishments, even if they are small.  Keep all follow-up visits as told by your health care provider. This is important. Contact a health care provider if:  Your symptoms do not get better.  Your symptoms get worse.  You have signs of depression, such as: ? A persistently sad, cranky, or irritable mood. ? Loss of enjoyment in activities that used to bring you joy. ? Change in weight or eating. ? Changes in sleeping habits. ? Avoiding friends or family members. ? Loss of energy for normal tasks. ? Feelings of guilt or worthlessness. Get help right away if:  You have serious thoughts about hurting yourself or others. If you ever feel like you may hurt yourself or others, or have thoughts about taking your own life, get help right away. You can go to your nearest emergency department or  call:  Your local emergency services (911 in the [place]  A suicide crisis helpline, such as the [facility] at 380-427-6439. This is open 24 hours a day.  Summary  Generalized anxiety disorder (GAD) is a mental health disorder that involves worry that is not triggered by a specific event.  People with GAD often worry excessively about many things in their lives, such as their health and family.  GAD may cause physical symptoms such as restlessness, trouble concentrating, sleep problems, frequent sweating, nausea, diarrhea, headaches, and trembling or muscle twitching.  A mental health specialist can help determine which treatment is best for you. Some people see improvement with one type of therapy. However, other people require a combination of therapies. This information is not intended to replace advice given to you by your health care provider. Make sure you discuss any questions you have with your health care provider. Document Released: 01/07/2013 Document Revised: 08/02/2016 Document Reviewed: 08/02/2016 Elsevier Interactive Patient Education  [employer].

## 2017-11-23 ENCOUNTER — Other Ambulatory Visit: Payer: Self-pay

## 2017-11-23 ENCOUNTER — Encounter (HOSPITAL_COMMUNITY): Payer: Self-pay | Admitting: Emergency Medicine

## 2017-11-23 DIAGNOSIS — F419 Anxiety disorder, unspecified: Secondary | ICD-10-CM | POA: Diagnosis not present

## 2017-11-23 DIAGNOSIS — Z7982 Long term (current) use of aspirin: Secondary | ICD-10-CM | POA: Diagnosis not present

## 2017-11-23 DIAGNOSIS — N3289 Other specified disorders of bladder: Secondary | ICD-10-CM | POA: Diagnosis not present

## 2017-11-23 DIAGNOSIS — Z96641 Presence of right artificial hip joint: Secondary | ICD-10-CM | POA: Diagnosis not present

## 2017-11-23 DIAGNOSIS — I10 Essential (primary) hypertension: Secondary | ICD-10-CM | POA: Insufficient documentation

## 2017-11-23 DIAGNOSIS — Z79899 Other long term (current) drug therapy: Secondary | ICD-10-CM | POA: Diagnosis not present

## 2017-11-23 DIAGNOSIS — R1030 Lower abdominal pain, unspecified: Secondary | ICD-10-CM | POA: Diagnosis present

## 2017-11-23 DIAGNOSIS — R109 Unspecified abdominal pain: Secondary | ICD-10-CM | POA: Diagnosis not present

## 2017-11-23 LAB — ETHANOL: Alcohol, Ethyl (B): <10 mg/dL (ref <10)

## 2017-11-23 LAB — URINALYSIS, ROUTINE W REFLEX MICROSCOPIC
BILIRUBIN URINE: NEGATIVE
GLUCOSE, UA: NEGATIVE mg/dL
Hgb urine dipstick: NEGATIVE
KETONES UR: NEGATIVE mg/dL
LEUKOCYTES UA: NEGATIVE
Nitrite: NEGATIVE
PROTEIN: NEGATIVE mg/dL
Specific Gravity, Urine: 1.008 (ref 1.005–1.030)
pH: 6 (ref 5.0–8.0)

## 2017-11-23 LAB — CBC
HCT: 38.6 % (ref 36.0–46.0)
Hemoglobin: 13.6 g/dL (ref 12.0–15.0)
MCH: 31.7 pg (ref 26.0–34.0)
MCHC: 35.2 g/dL (ref 30.0–36.0)
MCV: 90 fL (ref 78.0–100.0)
PLATELETS: 237 10*3/uL (ref 150–400)
RBC: 4.29 MIL/uL (ref 3.87–5.11)
RDW: 12.8 % (ref 11.5–15.5)
WBC: 4.4 10*3/uL (ref 4.0–10.5)

## 2017-11-23 LAB — COMPREHENSIVE METABOLIC PANEL
ALBUMIN: 4.2 g/dL (ref 3.5–5.0)
ALT: 25 U/L (ref 14–54)
AST: 22 U/L (ref 15–41)
Alkaline Phosphatase: 83 U/L (ref 38–126)
Anion gap: 10 (ref 5–15)
BILIRUBIN TOTAL: 0.7 mg/dL (ref 0.3–1.2)
BUN: 9 mg/dL (ref 6–20)
CO2: 23 mmol/L (ref 22–32)
CREATININE: 0.9 mg/dL (ref 0.44–1.00)
Calcium: 9.5 mg/dL (ref 8.9–10.3)
Chloride: 101 mmol/L (ref 101–111)
GFR calc Af Amer: >60 mL/min (ref >60)
GLUCOSE: 101 mg/dL — AB (ref 65–99)
Potassium: 4.8 mmol/L (ref 3.5–5.1)
Sodium: 134 mmol/L — ABNORMAL LOW (ref 135–145)
TOTAL PROTEIN: 7 g/dL (ref 6.5–8.1)

## 2017-11-23 LAB — LIPASE, BLOOD: Lipase: 31 U/L (ref 11–51)

## 2017-11-23 NOTE — ED Triage Notes (Signed)
Pt states she is having abd pain since last week, with some problems to urinate, pt denies any nausea or vomiting, no fever or chills.

## 2017-11-24 ENCOUNTER — Encounter: Payer: Self-pay | Admitting: Internal Medicine

## 2017-11-24 ENCOUNTER — Emergency Department (HOSPITAL_COMMUNITY)
Admission: EM | Admit: 2017-11-24 | Discharge: 2017-11-24 | Disposition: A | Discharge status: Home / Self Care | Payer: Medicare Other | Attending: Emergency Medicine | Admitting: Emergency Medicine

## 2017-11-24 DIAGNOSIS — N3289 Other specified disorders of bladder: Secondary | ICD-10-CM

## 2017-11-24 MED ORDER — OXYBUTYNIN CHLORIDE 5 MG PO TABS
5.0000 mg | ORAL_TABLET | ORAL | Status: AC
  Administered 2017-11-24: 5 mg via ORAL
  Filled 2017-11-24: qty 1

## 2017-11-24 MED ORDER — OXYBUTYNIN CHLORIDE 5 MG PO TABS: 5.0000 mg | ORAL_TABLET | Freq: Three times a day (TID) | ORAL | 0 refills | Status: DC

## 2017-11-24 NOTE — ED Notes (Addendum)
Pt waiting in waiting room for 15 mins because taking ditropan for the first time, instructed to come right back if any signs of allergic reation

## 2017-11-24 NOTE — ED Notes (Signed)
Bladder scan 37ml

## 2017-11-24 NOTE — ED Provider Notes (Signed)
[facility] EMERGENCY DEPARTMENT Provider Note   CSN: [id number] Arrival date & time: 11/23/17  2220     History   Chief Complaint Chief Complaint  Patient presents with  . Abdominal Pain    HPI Elizabeth Wilkins is a 72 y.o. female.  The history is provided by the patient.  Abdominal Pain    She has history of hypertension, hyperlipidemia, and is status post right hip arthroplasty in November.  Since then, she has been having pain in her groin which radiates up into her abdomen.  She comes in with pretty much continuation of the same complaints.  This time, she is now complaining of constant sense that she has to urinate.  There is no dysuria and no nausea or vomiting.  She denies fever or chills.  She denies constipation or diarrhea.  She had been in the emergency department for similar complaints last week, but was not having the urinary difficulty at that time.  She is scheduled to see her orthopedic doctor tomorrow.  She does relate that she had a tendon rupture that was identified at the time of surgery.  Past Medical History:  Diagnosis Date  . Hyperlipidemia   . Hypertension   . Osteopenia     Patient Active Problem List   Diagnosis Date Noted  . Right hip pain 11/22/2017  . Anxiety 11/22/2017  . Ecchymosis of left eye 11/09/2017  . Chronic nonintractable headache 09/11/2017  . Sinus pain 09/11/2017  . Pre-op evaluation 08/22/2017  . Closed displaced fracture of right femoral neck ([place]) 08/22/2017  . Hip fracture ([place]) 08/21/2017  . 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 Herpes simplex 03/30/2017  . Vitamin D deficiency 03/30/2017  . Decreased hearing 03/30/2017  . Hyperglycemia 11/26/2015  . Seborrheic dermatitis 05/14/2012  . Osteopenia 11/18/2010  . Dyslipidemia 11/09/2010  . Elevated blood pressure reading 11/08/2010    Past Surgical History:  Procedure Laterality Date  . broken wrist Right    plates and screws  .  COLONOSCOPY    . 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 TUBAL LIGATION      OB History    No data available       Home Medications    Prior to Admission medications   Medication Sig Start Date End Date Taking? Authorizing Provider  amoxicillin (AMOXIL) 500 MG capsule  11/21/17   [provider]  aspirin 81 MG chewable tablet Chew 1 tablet (81 mg total) by mouth 2 (two) times daily with a meal. 08/23/17   Swinteck, Aaron Edelman, MD  chlorhexidine (PERIDEX) 0.12 % solution AFTER BRUSHING, RINSE WITH ONE CAPFUL FOR ONE MINUTE TWICE A DAY THEN SPIT OUT 11/16/17   [provider]  ketoconazole (NIZORAL) 2 % shampoo Apply 1 application topically daily as needed. 05/22/17   [provider]  methocarbamol (ROBAXIN) 500 MG tablet Take 1 tablet (500 mg total) by mouth 3 (three) times daily as needed for up to 10 days for muscle spasms. Patient not taking: Reported on 11/20/2017 11/18/17 11/28/17  Noemi Chapel, MD  naproxen sodium (ANAPROX) 550 MG tablet  11/21/17   [provider]  PARoxetine (PAXIL) 10 MG tablet Take 1 tablet (10 mg total) by mouth daily. 11/22/17   Binnie Rail, MD  traMADol (ULTRAM) 50 MG tablet tramadol 50 mg  tablet  Take 1 tablet(s) EVERY 6 HOURS as needed for pain.    [provider]    Family History Family History  Problem Relation Age of Onset  . Pancreatic cancer Mother 17  . Hypertension Mother   . Hyperlipidemia Mother   . Rectal cancer Brother 62  . Lupus Father 63  . Colon cancer Neg Hx   . Stomach cancer Neg Hx     Social History Social History   Tobacco Use  . Smoking status: Never Smoker  . Smokeless tobacco: Never Used  Substance Use Topics  . Alcohol use: Yes    Alcohol/week: 12.6 oz    Types: 14 Glasses of wine, 7 Shots of liquor per  week    Comment: 2-3 drinks daily  . Drug use: No     Allergies   Morphine   Review of Systems Review of Systems  Gastrointestinal: Positive for abdominal pain.  All other systems reviewed and are negative.    Physical Exam Updated Vital Signs BP (!) 154/107 (BP Location: Right Arm)   Pulse 76   Temp 97.7 F (36.5 C) (Oral)   Resp 18   Ht 5\' 10"  (1.778 m)   Wt 79.4 kg (175 lb)   SpO2 99%   BMI 25.11 kg/m   Physical Exam  Nursing note and vitals reviewed.  72 year old female, resting comfortably and in no acute distress. Vital signs are significant for elevated blood pressure. Oxygen saturation is 99%, which is normal. Head is normocephalic and atraumatic. PERRLA, EOMI. Oropharynx is clear. Neck is nontender and supple without adenopathy or JVD. Back is nontender and there is no CVA tenderness. Lungs are clear without rales, wheezes, or rhonchi. Chest is nontender. Heart has regular rate and rhythm without murmur. Abdomen is soft, flat, nontender without masses or hepatosplenomegaly and peristalsis is normoactive.  Bladder does not feel distended. Extremities have no cyanosis or edema, full range of motion is present. Skin is warm and dry without rash. Neurologic: Mental status is normal, cranial nerves are intact, there are no motor or sensory deficits.  ED Treatments / Results  Labs (all labs ordered are listed, but only abnormal results are displayed) Labs Reviewed  COMPREHENSIVE METABOLIC PANEL - Abnormal; Notable for the following components:      Result Value   Sodium 134 (*)    Glucose, Bld 101 (*)    All other components within normal limits  URINALYSIS, ROUTINE W REFLEX MICROSCOPIC - Abnormal; Notable for the following components:   Color, Urine STRAW (*)    All other components within normal limits  LIPASE, BLOOD  CBC  ETHANOL   Procedures Procedures   Medications Ordered in ED Medications  oxybutynin (DITROPAN) tablet 5 mg (not administered)      Initial Impression / Assessment and Plan / ED Course  I have reviewed the triage vital signs and the nursing notes.  Pertinent lab results that were available during my care of the patient were reviewed by me and considered in my medical decision making (see chart for details).  Ongoing abdominal discomfort with now sense of urinary urgency and frequency.  Old records are reviewed confirming recent ED visit for abdominal discomfort at which time CT was unremarkable.  Laboratory workup today is normal including urinalysis.  No indication for repeat CT scan.  Abdominal exam is benign.  Will check bladder scan-I suspect she is having bladder spasm.  Post void bladder scan shows 37 mL residual.  I do believe  her symptoms are all related to bladder spasm.  She will be discharged with prescription for oxybutynin and referred to PCP.  She is also to keep her follow-up appointment with her orthopedist.  Final Clinical Impressions(s) / ED Diagnoses   Final diagnoses:  Bladder spasm    ED Discharge Orders        Ordered    oxybutynin (DITROPAN) 5 MG tablet  3 times daily     03/19/75 2831       Delora Fuel, MD 51/76/16 [phone]

## 2017-11-24 NOTE — Discharge Instructions (Signed)
Drink plenty of fluids.  Return if symptoms are getting worse. 

## 2017-11-27 ENCOUNTER — Encounter: Payer: Self-pay | Admitting: Internal Medicine

## 2017-11-27 ENCOUNTER — Ambulatory Visit (INDEPENDENT_AMBULATORY_CARE_PROVIDER_SITE_OTHER): Payer: Medicare Other | Admitting: Internal Medicine

## 2017-11-27 VITALS — BP 134/84 | HR 58 | Temp 98.4°F | Resp 16 | Wt 174.0 lb

## 2017-11-27 DIAGNOSIS — N3289 Other specified disorders of bladder: Secondary | ICD-10-CM

## 2017-11-27 DIAGNOSIS — F32A Depression, unspecified: Secondary | ICD-10-CM | POA: Insufficient documentation

## 2017-11-27 DIAGNOSIS — F419 Anxiety disorder, unspecified: Secondary | ICD-10-CM | POA: Diagnosis not present

## 2017-11-27 DIAGNOSIS — F3289 Other specified depressive episodes: Secondary | ICD-10-CM

## 2017-11-27 DIAGNOSIS — F329 Major depressive disorder, single episode, unspecified: Secondary | ICD-10-CM | POA: Insufficient documentation

## 2017-11-27 MED ORDER — PAROXETINE HCL 20 MG PO TABS: 20.0000 mg | ORAL_TABLET | Freq: Every day | ORAL | 5 refills | Status: DC

## 2017-11-27 MED ORDER — CLONAZEPAM 0.5 MG PO TABS: 0.5000 mg | ORAL_TABLET | Freq: Two times a day (BID) | ORAL | 1 refills | Status: DC | PRN

## 2017-11-27 NOTE — Progress Notes (Signed)
Subjective:    Patient ID: Elizabeth Wilkins, female    DOB: 09/28/1945, 72 y.o.   MRN: [id number]  HPI The patient is here for an acute visit.  She is here with her husband.  Depression, anxiety:  She is taking the paxil 10 mg daily - this was started last week.  She is sleeping all the time.  She has no appetite and is not eating.  She is drinking ensure.  She has had constant medical issues over the past few months and recently it has been every day.  She is extremely depressed and anxious.  Difficulty urinating, bladder pain: she went to the ED and was diagnosed with bladder spasms.  She feels like she can't go at times and may not be emptying her bladder.  She is taking the oxybutynin daily.  She has not noticed any difference.    She saw the dentist this morning.  She is taking amoxicillin and was put on naproxen.  He told her she is healing well and everything looks good.   Right groin/hip pain: She does see orthopedics tomorrow for follow-up.  She has been taking tramadol as needed, but is very concerned about this pain coming back and feels like she needs to take the medication preventatively she only has a couple of pills left.  It is unclear what is causing the pain.  Concern over having the pain recur and be severe is causing increased anxiety.  Medications and allergies reviewed with patient and updated if appropriate.  Patient Active Problem List   Diagnosis Date Noted  . Right hip pain 11/22/2017  . Anxiety 11/22/2017  . Ecchymosis of left eye 11/09/2017  . Chronic nonintractable headache 09/11/2017  . Sinus pain 09/11/2017  . Pre-op evaluation 08/22/2017  . Closed displaced fracture of right femoral neck ([place]) 08/22/2017  . Hip fracture ([place]) 08/21/2017  . 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 Herpes simplex 03/30/2017  . Vitamin D deficiency 03/30/2017  . Decreased hearing 03/30/2017  . Hyperglycemia 11/26/2015  . Seborrheic dermatitis 05/14/2012  .  Osteopenia 11/18/2010  . Dyslipidemia 11/09/2010  . Elevated blood pressure reading 11/08/2010    Current Outpatient Medications on File Prior to Visit  Medication Sig Dispense Refill  . amoxicillin (AMOXIL) 500 MG capsule     . aspirin 81 MG chewable tablet Chew 1 tablet (81 mg total) by mouth 2 (two) times daily with a meal. 60 tablet 1  . chlorhexidine (PERIDEX) 0.12 % solution AFTER BRUSHING, RINSE WITH ONE CAPFUL FOR ONE MINUTE TWICE A DAY THEN SPIT OUT  0  . ketoconazole (NIZORAL) 2 % shampoo Apply 1 application topically daily as needed.  2  . naproxen sodium (ANAPROX) 550 MG tablet Take 550 mg by mouth every 12 (twelve) hours as needed.     Marland Kitchen oxybutynin (DITROPAN) 5 MG tablet Take 1 tablet (5 mg total) by mouth 3 (three) times daily. 30 tablet 0  . PARoxetine (PAXIL) 10 MG tablet Take 1 tablet (10 mg total) by mouth daily. 30 tablet 5  . traMADol (ULTRAM) 50 MG tablet tramadol 50 mg tablet  Take 1 tablet(s) EVERY 6 HOURS as needed for pain.     No current facility-administered medications on file prior to visit.     Past Medical History:  Diagnosis Date  . Hyperlipidemia   . Hypertension   . Osteopenia     Past Surgical History:  Procedure Laterality Date  . broken wrist Right  plates and screws  . COLONOSCOPY    . 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 TUBAL LIGATION      Social History   Socioeconomic History  . Marital status: Married    Spouse name: None  . Number of children: 3  . Years of education: None  . Highest education level: None  Social Needs  . Financial resource strain: None  . Food insecurity - worry: None  . Food insecurity - inability: None  . Transportation needs - medical: None  . Transportation needs - non-medical: None  Occupational History  . Occupation:  retired  Tobacco Use  . Smoking status: Never Smoker  . Smokeless tobacco: Never Used  Substance and Sexual Activity  . Alcohol use: Yes    Alcohol/week: 12.6 oz    Types: 14 Glasses of wine, 7 Shots of liquor per week    Comment: 2-3 drinks daily  . Drug use: No  . Sexual activity: None  Other Topics Concern  . 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      No regular exercise    Family History  Problem Relation Age of Onset  . Pancreatic cancer Mother 81  . Hypertension Mother   . Hyperlipidemia Mother   . Rectal cancer Brother 42  . Lupus Father 6  . Colon cancer Neg Hx   . Stomach cancer Neg Hx     Review of Systems  Constitutional: Positive for appetite change (Decreased) and unexpected weight change. Negative for chills and fever.  Gastrointestinal: Positive for nausea.  Genitourinary: Positive for frequency. Negative for difficulty urinating, dysuria and hematuria.  Musculoskeletal: Positive for arthralgias.  Neurological: Positive for light-headedness.  Psychiatric/Behavioral: Positive for dysphoric mood and sleep disturbance. The patient is nervous/anxious.        Objective:   Vitals:   11/27/17 1006  BP: 134/84  Pulse: (!) 58  Resp: 16  Temp: 98.4 F (36.9 C)  SpO2: 97%   Wt Readings from Last 3 Encounters:  11/27/17 174 lb (78.9 kg)  11/23/17 175 lb (79.4 kg)  11/22/17 175 lb (79.4 kg)   Body mass index is 24.97 kg/m.   Physical Exam  Constitutional: She appears well-developed and well-nourished. No distress.  HENT:  Head: Normocephalic and atraumatic.  Musculoskeletal: She exhibits no edema.  Skin: Skin is warm and dry. She is not diaphoretic.  Psychiatric:  Very anxious appearing, appears to be having some difficulty remembering-likely related to anxiety           Assessment & Plan:    Will follow up with orthopedic tomorrow regarding her right hip/groin pain and will continue to follow-up with her dentist.  Hopefully as these issues improve so will her anxiety and depression  See Problem List for Assessment and Plan of chronic medical problems.

## 2017-11-27 NOTE — Patient Instructions (Addendum)
Take the naproxen twice daily with food.   Take the tramadol OR hydrocodone only if needed.  Increase the paxil to 20 mg daily.    Take the clonazepam twice daily.     Follow up in 2 weeks, sooner if needed

## 2017-11-27 NOTE — Assessment & Plan Note (Addendum)
Her anxiety is severe-related to all of her medical issues that have been going on Last week we discussed starting a benzodiazepine, but she did not want to do that because of the possibility of addiction Started 10 mg of Paxil daily, which she was not sure she wanted to start either.  She denies any side effects we will increase this to 20 mg daily Will start clonazepam 0.5 mg twice daily-I think the benefits of this medication outweigh any risks at this point-discussed potential side effects.  Discussed that this is potentially addicting.  Discussed that ideally this should be a short-term medication and eventually we can get her off of it We will have her follow-up in 2 weeks, sooner if needed Discussed potentially seeing a therapist

## 2017-11-27 NOTE — Assessment & Plan Note (Signed)
She is experiencing some depression related to her significant anxiety and all of her medical issues Taking Paxil 10 mg daily-we will increase to 20 mg Follow-up in 2 weeks, sooner if needed

## 2017-11-27 NOTE — Assessment & Plan Note (Signed)
She was diagnosed with bladder spasms from the emergency room.  Reviewed CT scan and urinalysis Both essentially normal Taking oxybutynin-hard to know if this has helped or not We will continue for now Follow-up in 2 weeks

## 2017-11-28 DIAGNOSIS — M25551 Pain in right hip: Secondary | ICD-10-CM | POA: Diagnosis not present

## 2017-11-28 DIAGNOSIS — Z96641 Presence of right artificial hip joint: Secondary | ICD-10-CM | POA: Diagnosis not present

## 2017-11-28 DIAGNOSIS — Z471 Aftercare following joint replacement surgery: Secondary | ICD-10-CM | POA: Diagnosis not present

## 2017-11-28 DIAGNOSIS — M76899 Other specified enthesopathies of unspecified lower limb, excluding foot: Secondary | ICD-10-CM | POA: Diagnosis not present

## 2017-12-10 NOTE — Progress Notes (Signed)
Subjective:    Patient ID: Elizabeth Wilkins, female    DOB: November 25, 1945, 72 y.o.   MRN: [id number]  HPI The patient is here for follow up.  Anxiety:   She is taking paxil 20 mg daily.  When she left here 2 weeks ago she started taking clonazepam at night only.  Last week she stopped taking this and has been sleeping fine.  Overall she feels much better.  She denies any side effects from the medication-Paxil.  Her energy level is better and she feels back to her normal self.  Her appetite is normal.  Her sleep is good.  She is still experiencing right groin pain, but it is better.  Her bladder symptoms have resolved.  She is still following with her dentist for her implant, but that seems to be healing well and she should get the implant placed later this month.    Overall, she is happy with her medication dose and wonders how long she should continue it.  Medications and allergies reviewed with patient and updated if appropriate.  Patient Active Problem List   Diagnosis Date Noted  . Bladder spasms 11/27/2017  . Depression 11/27/2017  . Right hip pain 11/22/2017  . Anxiety 11/22/2017  . Ecchymosis of left eye 11/09/2017  . Chronic nonintractable headache 09/11/2017  . Sinus pain 09/11/2017  . Pre-op evaluation 08/22/2017  . Closed displaced fracture of right femoral neck ([place]) 08/22/2017  . Hip fracture ([place]) 08/21/2017  . 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 Herpes simplex 03/30/2017  . Vitamin D deficiency 03/30/2017  . Decreased hearing 03/30/2017  . Hyperglycemia 11/26/2015  . Seborrheic dermatitis 05/14/2012  . Osteopenia 11/18/2010  . Dyslipidemia 11/09/2010  . Elevated blood pressure reading 11/08/2010    Current Outpatient Medications on File Prior to Visit  Medication Sig Dispense Refill  . amoxicillin (AMOXIL) 500 MG capsule     . aspirin 81 MG chewable tablet Chew 1 tablet (81 mg total) by mouth 2 (two) times daily with a meal. 60 tablet 1  .  chlorhexidine (PERIDEX) 0.12 % solution AFTER BRUSHING, RINSE WITH ONE CAPFUL FOR ONE MINUTE TWICE A DAY THEN SPIT OUT  0  . clonazePAM (KLONOPIN) 0.5 MG tablet Take 1 tablet (0.5 mg total) by mouth 2 (two) times daily as needed for anxiety. 60 tablet 1  . ketoconazole (NIZORAL) 2 % shampoo Apply 1 application topically daily as needed.  2  . naproxen sodium (ANAPROX) 550 MG tablet Take 550 mg by mouth every 12 (twelve) hours as needed.     Marland Kitchen oxybutynin (DITROPAN) 5 MG tablet Take 1 tablet (5 mg total) by mouth 3 (three) times daily. 30 tablet 0  . PARoxetine (PAXIL) 20 MG tablet Take 1 tablet (20 mg total) by mouth daily. 30 tablet 5  . traMADol (ULTRAM) 50 MG tablet tramadol 50 mg tablet  Take 1 tablet(s) EVERY 6 HOURS as needed for pain.     No current facility-administered medications on file prior to visit.     Past Medical History:  Diagnosis Date  . Hyperlipidemia   . Hypertension   . Osteopenia     Past Surgical History:  Procedure Laterality Date  . broken wrist Right    plates and screws  . COLONOSCOPY    . 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  TUBAL LIGATION      Social History   Socioeconomic History  . Marital status: Married    Spouse name: None  . Number of children: 3  . Years of education: None  . Highest education level: None  Social Needs  . Financial resource strain: None  . Food insecurity - worry: None  . Food insecurity - inability: None  . Transportation needs - medical: None  . Transportation needs - non-medical: None  Occupational History  . Occupation: retired  Tobacco Use  . Smoking status: Never Smoker  . Smokeless tobacco: Never Used  Substance and Sexual Activity  . Alcohol use: Yes    Alcohol/week: 12.6 oz    Types: 14 Glasses of wine, 7 Shots of liquor per  week    Comment: 2-3 drinks daily  . Drug use: No  . Sexual activity: None  Other Topics Concern  . 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      No regular exercise    Family History  Problem Relation Age of Onset  . Pancreatic cancer Mother 52  . Hypertension Mother   . Hyperlipidemia Mother   . Rectal cancer Brother 42  . Lupus Father 33  . Colon cancer Neg Hx   . Stomach cancer Neg Hx     Review of Systems  Constitutional: Negative for appetite change (Normal) and fever.  Respiratory: Negative for shortness of breath.   Cardiovascular: Negative for chest pain and palpitations.  Gastrointestinal: Negative for nausea.  Musculoskeletal: Positive for arthralgias.  Neurological: Negative for headaches.  Psychiatric/Behavioral: Negative for sleep disturbance.       Objective:   Vitals:   12/11/17 0850  BP: 130/86  Pulse: 83  Resp: 16  Temp: 97.8 F (36.6 C)  SpO2: 97%   BP Readings from Last 3 Encounters:  12/11/17 130/86  11/27/17 134/84  11/24/17 114/79   Wt Readings from Last 3 Encounters:  12/11/17 168 lb (76.2 kg)  11/27/17 174 lb (78.9 kg)  11/23/17 175 lb (79.4 kg)   Body mass index is 24.11 kg/m.   Physical Exam  Constitutional: She is oriented to person, place, and time. She appears well-developed and well-nourished. No distress.  HENT:  Head: Normocephalic and atraumatic.  Neurological: She is alert and oriented to person, place, and time.  Skin: Skin is warm and dry. She is not diaphoretic.  Psychiatric: She has a normal mood and affect. Her behavior is normal. Judgment and thought content normal.           Assessment & Plan:    See Problem List for Assessment and Plan of chronic medical problems.

## 2017-12-11 ENCOUNTER — Encounter: Payer: Self-pay | Admitting: Internal Medicine

## 2017-12-11 ENCOUNTER — Ambulatory Visit (INDEPENDENT_AMBULATORY_CARE_PROVIDER_SITE_OTHER): Payer: Medicare Other | Admitting: Internal Medicine

## 2017-12-11 VITALS — BP 130/86 | HR 83 | Temp 97.8°F | Resp 16 | Wt 168.0 lb

## 2017-12-11 DIAGNOSIS — F3289 Other specified depressive episodes: Secondary | ICD-10-CM

## 2017-12-11 DIAGNOSIS — N3289 Other specified disorders of bladder: Secondary | ICD-10-CM | POA: Diagnosis not present

## 2017-12-11 DIAGNOSIS — F419 Anxiety disorder, unspecified: Secondary | ICD-10-CM | POA: Diagnosis not present

## 2017-12-11 NOTE — Patient Instructions (Addendum)
  Medications reviewed and updated.  No changes recommended at this time.     Please followup in 6 months   

## 2017-12-11 NOTE — Assessment & Plan Note (Signed)
Resolved

## 2017-12-11 NOTE — Assessment & Plan Note (Signed)
Much improved Controlled, stable Continue current dose of paroxetine Follow-up in 6 months, sooner if needed

## 2017-12-12 DIAGNOSIS — S72001D Fracture of unspecified part of neck of right femur, subsequent encounter for closed fracture with routine healing: Secondary | ICD-10-CM | POA: Diagnosis not present

## 2017-12-19 ENCOUNTER — Encounter: Payer: Self-pay | Admitting: Internal Medicine

## 2017-12-21 ENCOUNTER — Encounter: Payer: Self-pay | Admitting: Internal Medicine

## 2017-12-23 ENCOUNTER — Encounter: Payer: Self-pay | Admitting: Internal Medicine

## 2018-03-14 ENCOUNTER — Encounter: Payer: Self-pay | Admitting: Family

## 2018-03-14 ENCOUNTER — Other Ambulatory Visit: Payer: Medicare Other

## 2018-03-14 ENCOUNTER — Ambulatory Visit (INDEPENDENT_AMBULATORY_CARE_PROVIDER_SITE_OTHER): Payer: Medicare Other | Admitting: Family

## 2018-03-14 VITALS — BP 138/82 | HR 44 | Temp 98.1°F | Ht 70.0 in | Wt 166.0 lb

## 2018-03-14 DIAGNOSIS — Z96641 Presence of right artificial hip joint: Secondary | ICD-10-CM | POA: Diagnosis not present

## 2018-03-14 DIAGNOSIS — R3915 Urgency of urination: Secondary | ICD-10-CM

## 2018-03-14 DIAGNOSIS — M1611 Unilateral primary osteoarthritis, right hip: Secondary | ICD-10-CM | POA: Diagnosis not present

## 2018-03-14 DIAGNOSIS — Z471 Aftercare following joint replacement surgery: Secondary | ICD-10-CM | POA: Diagnosis not present

## 2018-03-14 LAB — POC URINALSYSI DIPSTICK (AUTOMATED)
BILIRUBIN UA: NEGATIVE
Blood, UA: NEGATIVE
Glucose, UA: NEGATIVE
KETONES UA: NEGATIVE
Nitrite, UA: NEGATIVE
PH UA: 6 (ref 5.0–8.0)
Protein, UA: NEGATIVE
Spec Grav, UA: >=1.03 — AB (ref 1.010–1.025)
Urobilinogen, UA: 0.2 E.U./dL

## 2018-03-14 MED ORDER — SULFAMETHOXAZOLE-TRIMETHOPRIM 800-160 MG PO TABS: 1.0000 | ORAL_TABLET | Freq: Two times a day (BID) | ORAL | 0 refills | Status: DC

## 2018-03-14 NOTE — Addendum Note (Signed)
Addended by: Marcina Millard on: 03/14/2018 11:44 AM   Modules accepted: Orders

## 2018-03-14 NOTE — Progress Notes (Signed)
Elizabeth Wilkins is a 72 y.o. female with the following history as recorded in [employer]:  Patient Active Problem List   Diagnosis Date Noted  . Bladder spasms 11/27/2017  . Depression 11/27/2017  . Right hip pain 11/22/2017  . Anxiety 11/22/2017  . Ecchymosis of left eye 11/09/2017  . Chronic nonintractable headache 09/11/2017  . Sinus pain 09/11/2017  . Pre-op evaluation 08/22/2017  . Closed displaced fracture of right femoral neck ([place]) 08/22/2017  . Hip fracture ([place]) 08/21/2017  . 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 Herpes simplex 03/30/2017  . Vitamin D deficiency 03/30/2017  . Decreased hearing 03/30/2017  . Hyperglycemia 11/26/2015  . Seborrheic dermatitis 05/14/2012  . Osteopenia 11/18/2010  . Dyslipidemia 11/09/2010  . Elevated blood pressure reading 11/08/2010    Current Outpatient Medications  Medication Sig Dispense Refill  . amoxicillin (AMOXIL) 500 MG capsule     . chlorhexidine (PERIDEX) 0.12 % solution AFTER BRUSHING, RINSE WITH ONE CAPFUL FOR ONE MINUTE TWICE A DAY THEN SPIT OUT  0  . ketoconazole (NIZORAL) 2 % shampoo Apply 1 application topically daily as needed.  2  . naproxen sodium (ANAPROX) 550 MG tablet Take 550 mg by mouth every 12 (twelve) hours as needed.     . sulfamethoxazole-trimethoprim (BACTRIM DS,SEPTRA DS) 800-160 MG tablet Take 1 tablet by mouth 2 (two) times daily. 10 tablet 0  . traMADol (ULTRAM) 50 MG tablet tramadol 50 mg tablet  Take 1 tablet(s) EVERY 6 HOURS as needed for pain.     No current facility-administered medications for this visit.     Allergies: Morphine  Past Medical History:  Diagnosis Date  . Hyperlipidemia   . Hypertension   . Osteopenia     Past Surgical History:  Procedure Laterality Date  . broken wrist Right    plates and screws  . COLONOSCOPY    . 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 TUBAL LIGATION      Family History  Problem Relation Age of Onset  . Pancreatic cancer Mother 71  . Hypertension Mother   . Hyperlipidemia Mother   . Rectal cancer Brother 51  . Lupus Father 29  . Colon cancer Neg Hx   . Stomach cancer Neg Hx     Social History   Tobacco Use  . Smoking status: Never Smoker  . Smokeless tobacco: Never Used  Substance Use Topics  . Alcohol use: Yes    Alcohol/week: 12.6 oz    Types: 14 Glasses of wine, 7 Shots of liquor per week    Comment: 2-3 drinks daily    Subjective:  Patient presents with concerns for possible UTI; complaining of "bladder pressure" and sense of urinary urgency/ frequency; denies any blood in the urine; not prone to urinary tract infection;  Objective:  Vitals:   03/14/18 1108  BP: 138/82  Pulse: (!) 44  Temp: 98.1 F (36.7 C)  TempSrc: Oral  SpO2: 99%  Weight: 166 lb (75.3 kg)  Height: 5\' 10"  (1.778 m)    General: Well developed, well nourished, in no acute distress  Skin : Warm and dry.  Head: Normocephalic and atraumatic  Lungs: Respirations unlabored; clear to auscultation bilaterally without wheeze, rales, rhonchi  CVS exam: normal rate and regular rhythm.  Neurologic: Alert and oriented; speech intact; face symmetrical; moves all extremities well;  CNII-XII intact without focal deficit  Assessment:  1. Urinary urgency     Plan:  Suspect UTI; check U/A and urine culture; Rx for Bactrim DS bid x 5 days; follow-up to be determined;    No follow-ups on file.  Orders Placed This Encounter  Procedures  . Urine Culture    Standing Status:   Future    Standing Expiration Date:   03/14/2019    Requested Prescriptions   Signed Prescriptions Disp Refills  . sulfamethoxazole-trimethoprim (BACTRIM DS,SEPTRA DS) 800-160 MG tablet 10 tablet 0    Sig: Take 1 tablet by mouth 2 (two) times daily.

## 2018-03-15 ENCOUNTER — Encounter: Payer: Self-pay | Admitting: Internal Medicine

## 2018-03-15 LAB — URINE CULTURE
MICRO NUMBER:: 90734139
SPECIMEN QUALITY: ADEQUATE

## 2018-03-16 ENCOUNTER — Other Ambulatory Visit: Payer: Self-pay | Admitting: Family

## 2018-03-16 MED ORDER — FLUCONAZOLE 150 MG PO TABS: 150.0000 mg | ORAL_TABLET | Freq: Once | ORAL | 0 refills | Status: AC

## 2018-04-10 ENCOUNTER — Ambulatory Visit: Payer: Medicare Other | Admitting: Internal Medicine

## 2018-04-10 ENCOUNTER — Ambulatory Visit: Payer: Medicare Other | Admitting: Family

## 2018-04-17 DIAGNOSIS — M1611 Unilateral primary osteoarthritis, right hip: Secondary | ICD-10-CM | POA: Diagnosis not present

## 2018-04-25 ENCOUNTER — Ambulatory Visit: Payer: Self-pay

## 2018-04-25 NOTE — Telephone Encounter (Signed)
Returned call to pt.  Reported she has had mid upper abdominal pain for the past week.  Denied any radiation of the abd. pain into chest or back.  Stated she has had extensive dental work recently.  Reported she does not have much of an appetite, due to the amt. of pain she has been in.  Stated she has dropped 30 # over several months. Reported taking various pain medications for the dental work:  Aleve, ES Tylenol, Naproxyn, and Hydrocodone.  Also, stated she has taken antibiotic premedication, prior to her dental procedures, due to hx of hip replacement.  C/o increased frequency in the abdominal pain.  Stated the pain is "sharp", and comes and goes within about 30 seconds.  Rated pain at 4-5/10.  Denied nausea or vomiting.  Stated she had diarrhea on Monday, but no further episodes, since.  Stated she is in a lot of pain with her teeth, due to recent procedure, and has placed a call to the dentist for recommendation.  Advised she should eat something with each dose of pain medication, as it may be causing irritation to the stomach lining.  Appt. Scheduled at 9:30 AM, tomorrow, with PCP.   Care advice given per protocol.  Verb. Understanding.          Reason for Disposition . [1] MODERATE pain (e.g., interferes with normal activities) AND [2] comes and goes (cramps) AND [3] present > 24 hours  (Exception: pain with Vomiting or Diarrhea - see that Guideline)  Answer Assessment - Initial Assessment Questions 1. LOCATION: "Where does it hurt?"      Mid- upper abdomen  2. RADIATION: "Does the pain shoot anywhere else?" (e.g., chest, back)     Denied radiation 3. ONSET: "When did the pain begin?" (e.g., minutes, hours or days ago)      About a week ago 4. SUDDEN: "Gradual or sudden onset?"     Sudden 5. PATTERN "Does the pain come and go, or is it constant?"    - If constant: "Is it getting better, staying the same, or worsening?"      (Note: Constant means the pain never goes away completely; most  serious pain is constant and it progresses)     - If intermittent: "How long does it last?" "Do you have pain now?"     (Note: Intermittent means the pain goes away completely between bouts)     Comes and goes 6. SEVERITY: "How bad is the pain?"  (e.g., Scale 1-10; mild, moderate, or severe)    - MILD (1-3): doesn't interfere with normal activities, abdomen soft and not tender to touch     - MODERATE (4-7): interferes with normal activities or awakens from sleep, tender to touch     - SEVERE (8-10): excruciating pain, doubled over, unable to do any normal activities       Mild to moderate  7. RECURRENT SYMPTOM: "Have you ever had this type of abdominal pain before?" If so, ask: "When was the last time?" and "What happened that time?"      denied 8. AGGRAVATING FACTORS: "Does anything seem to cause this pain?" (e.g., foods, stress, alcohol)     Has been under stress with extensive dental work 9. CARDIAC SYMPTOMS: "Do you have any of the following symptoms: chest pain, difficulty breathing, sweating, nausea?"    Denied chest pain, shortness of breath, or sweating 10. OTHER SYMPTOMS: "Do you have any other symptoms?" (e.g., fever, vomiting, diarrhea)  Denied nausea or vomiting; some diarrhea 2 days ago; decreased appetite  11. PREGNANCY: "Is there any chance you are pregnant?" "When was your last menstrual period?"       n/a  Protocols used: ABDOMINAL PAIN - UPPER-A-AH

## 2018-04-25 NOTE — Progress Notes (Deleted)
Subjective:    Patient ID: Elizabeth Wilkins, female    DOB: [birth year], 72 y.o.   MRN: [id number]  HPI The patient is here for an acute visit.   Abdominal pain:    Medications and allergies reviewed with patient and updated if appropriate.  Patient Active Problem List   Diagnosis Date Noted  . Bladder spasms 11/27/2017  . Depression 11/27/2017  . Right hip pain 11/22/2017  . Anxiety 11/22/2017  . Ecchymosis of left eye 11/09/2017  . Chronic nonintractable headache 09/11/2017  . Sinus pain 09/11/2017  . Pre-op evaluation 08/22/2017  . Closed displaced fracture of right femoral neck ([place]) 08/22/2017  . Hip fracture ([place]) 08/21/2017  . 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 Herpes simplex 03/30/2017  . Vitamin D deficiency 03/30/2017  . Decreased hearing 03/30/2017  . Hyperglycemia 11/26/2015  . Seborrheic dermatitis 05/14/2012  . Osteopenia 11/18/2010  . Dyslipidemia 11/09/2010  . Elevated blood pressure reading 11/08/2010    Current Outpatient Medications on File Prior to Visit  Medication Sig Dispense Refill  . amoxicillin (AMOXIL) 500 MG capsule     . chlorhexidine (PERIDEX) 0.12 % solution AFTER BRUSHING, RINSE WITH ONE CAPFUL FOR ONE MINUTE TWICE A DAY THEN SPIT OUT  0  . ketoconazole (NIZORAL) 2 % shampoo Apply 1 application topically daily as needed.  2  . naproxen sodium (ANAPROX) 550 MG tablet Take 550 mg by mouth every 12 (twelve) hours as needed.     . sulfamethoxazole-trimethoprim (BACTRIM DS,SEPTRA DS) 800-160 MG tablet Take 1 tablet by mouth 2 (two) times daily. 10 tablet 0  . traMADol (ULTRAM) 50 MG tablet tramadol 50 mg tablet  Take 1 tablet(s) EVERY 6 HOURS as needed for pain.     No current facility-administered medications on file prior to visit.     Past Medical History:  Diagnosis Date  . Hyperlipidemia   . Hypertension   . Osteopenia     Past Surgical History:  Procedure Laterality Date  . broken wrist Right    plates and  screws  . COLONOSCOPY    . 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 TUBAL LIGATION      Social History   Socioeconomic History  . Marital status: Married    Spouse name: Not on file  . Number of children: 3  . Years of education: Not on file  . Highest education level: Not on file  Occupational History  . Occupation: retired  Scientific laboratory technician  . Financial resource strain: Not on file  . Food insecurity:    Worry: Not on file    Inability: Not on file  . Transportation needs:    Medical: Not on file    Non-medical: Not on file  Tobacco Use  . Smoking status: Never Smoker  . Smokeless tobacco: Never Used  Substance and Sexual Activity  . Alcohol use: Yes    Alcohol/week: 12.6 oz    Types: 14 Glasses of wine, 7 Shots of liquor per week    Comment: 2-3 drinks daily  . Drug use: No  . Sexual activity: Not on file  Lifestyle  . Physical activity:    Days per week: Not on file    Minutes per session: Not on file  . Stress: Not on file  Relationships  . Social connections:  Talks on phone: Not on file    Gets together: Not on file    Attends religious service: Not on file    Active member of club or organization: Not on file    Attends meetings of clubs or organizations: Not on file    Relationship status: Not on file  Other Topics Concern  . 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      No regular exercise    Family History  Problem Relation Age of Onset  . Pancreatic cancer Mother 27  . Hypertension Mother   . Hyperlipidemia Mother   . Rectal cancer Brother 72  . Lupus Father 28  . Colon cancer Neg Hx   . Stomach cancer Neg Hx      Review of Systems     Objective:  There were no vitals filed for this visit. BP Readings from Last 3 Encounters:  03/14/18 138/82  12/11/17 130/86  11/27/17 134/84   Wt Readings from Last 3 Encounters:  03/14/18 166 lb (75.3 kg)  12/11/17 168 lb (76.2 kg)  11/27/17 174 lb (78.9 kg)   There is no height or weight on file to calculate BMI.   Physical Exam         Assessment & Plan:    See Problem List for Assessment and Plan of chronic medical problems.

## 2018-04-26 ENCOUNTER — Ambulatory Visit: Payer: Medicare Other | Admitting: Internal Medicine

## 2018-04-30 NOTE — Progress Notes (Signed)
Subjective:    Patient ID: Elizabeth Wilkins, female    DOB: [birth year], 72 y.o.   MRN: [id number]  HPI The patient is here for an acute visit.  She is undergoing a lot of dental work over the past several months.  It is difficulty to eat and she has lost weight.  She is taking a lot of antibiotics and over-the-counter pain medication such as Tylenol, Advil and Aleve and she is very concerned about that.  She is having increased anxiety and depression because of the chronic pain, continue dental work and the general stress and anxiety of life.    She has had easy bruising and ecchymosis from medication.  She does worry about side effects from taking all the over-the-counter pain medication.  Abdominal pain: She was experiencing a lot of epigastric pain.  When she called in here to make an appointment it was recommended that she try taking her medication with yogurt and drinking more Ensure and since doing this her abdominal pain has resolved.  She denies any GERD or reflux.  She is experiencing some constipation and she thinks that is because she is not eating that much.   She feels depressed and anxious.  She did go back on paxil 10 mg daily.  She is unsure if she wants to increase the dose or not - she is concerned it may increase fatigue and make her not want to do anything.   She has good friends and her family is supportive.  She wonders if she should speak with a counselor and that is one reason she is here today.  She is exercising regularly and that does help as well.      Medications and allergies reviewed with patient and updated if appropriate.  Patient Active Problem List   Diagnosis Date Noted  . Easy bruising 05/01/2018  . Bladder spasms 11/27/2017  . Depression 11/27/2017  . Right hip pain 11/22/2017  . Anxiety 11/22/2017  . Closed displaced fracture of right femoral neck ([place]) 08/22/2017  . 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 Herpes simplex 03/30/2017  .  Vitamin D deficiency 03/30/2017  . Decreased hearing 03/30/2017  . Hyperglycemia 11/26/2015  . Seborrheic dermatitis 05/14/2012  . Osteopenia 11/18/2010  . Dyslipidemia 11/09/2010  . Elevated blood pressure reading 11/08/2010    Current Outpatient Medications on File Prior to Visit  Medication Sig Dispense Refill  . amoxicillin (AMOXIL) 500 MG capsule     . chlorhexidine (PERIDEX) 0.12 % solution AFTER BRUSHING, RINSE WITH ONE CAPFUL FOR ONE MINUTE TWICE A DAY THEN SPIT OUT  0  . ketoconazole (NIZORAL) 2 % shampoo Apply 1 application topically daily as needed.  2  . naproxen sodium (ANAPROX) 550 MG tablet Take 550 mg by mouth every 12 (twelve) hours as needed.      No current facility-administered medications on file prior to visit.     Past Medical History:  Diagnosis Date  . Hyperlipidemia   . Hypertension   . Osteopenia     Past Surgical History:  Procedure Laterality Date  . broken wrist Right    plates and screws  . COLONOSCOPY    . 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 TUBAL LIGATION      Social History   Socioeconomic History  . Marital status: Married  Spouse name: Not on file  . Number of children: 3  . Years of education: Not on file  . Highest education level: Not on file  Occupational History  . Occupation: retired  Scientific laboratory technician  . Financial resource strain: Not on file  . Food insecurity:    Worry: Not on file    Inability: Not on file  . Transportation needs:    Medical: Not on file    Non-medical: Not on file  Tobacco Use  . Smoking status: Never Smoker  . Smokeless tobacco: Never Used  Substance and Sexual Activity  . Alcohol use: Yes    Alcohol/week: 12.6 oz    Types: 14 Glasses of wine, 7 Shots of liquor per week    Comment: 2-3 drinks daily  . Drug use: No    . Sexual activity: Not on file  Lifestyle  . Physical activity:    Days per week: Not on file    Minutes per session: Not on file  . Stress: Not on file  Relationships  . Social connections:    Talks on phone: Not on file    Gets together: Not on file    Attends religious service: Not on file    Active member of club or organization: Not on file    Attends meetings of clubs or organizations: Not on file    Relationship status: Not on file  Other Topics Concern  . 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      No regular exercise    Family History  Problem Relation Age of Onset  . Pancreatic cancer Mother 42  . Hypertension Mother   . Hyperlipidemia Mother   . Rectal cancer Brother 15  . Lupus Father 30  . Colon cancer Neg Hx   . Stomach cancer Neg Hx     Review of Systems  Constitutional: Negative for chills and fever.  Respiratory: Negative for shortness of breath.   Cardiovascular: Negative for chest pain and palpitations.  Gastrointestinal: Positive for constipation. Negative for abdominal pain and nausea.       No gerd  Neurological: Positive for light-headedness.  Hematological: Bruises/bleeds easily.  Psychiatric/Behavioral: Positive for dysphoric mood and sleep disturbance (wakes up frequently). The patient is nervous/anxious.        Objective:   Vitals:   05/01/18 1011  BP: (!) 142/94  Pulse: 77  Resp: 16  Temp: 98.4 F (36.9 C)  SpO2: 98%   BP Readings from Last 3 Encounters:  05/01/18 (!) 142/94  03/14/18 138/82  12/11/17 130/86   Wt Readings from Last 3 Encounters:  05/01/18 153 lb (69.4 kg)  03/14/18 166 lb (75.3 kg)  12/11/17 168 lb (76.2 kg)   Body mass index is 21.95 kg/m.   Physical Exam    Constitutional: Appears well-developed and well-nourished. No distress.  HENT:  Head:  Normocephalic and atraumatic.  Neck: Neck supple. No tracheal deviation present. No thyromegaly present.  No cervical lymphadenopathy Cardiovascular: Normal rate, regular rhythm and normal heart sounds.   No murmur heard. No carotid bruit .  No edema Pulmonary/Chest: Effort normal and breath sounds normal. No respiratory distress. No has no wheezes. No rales.  Skin: Skin is warm and dry. Not diaphoretic.  Psychiatric: Depressed and anxious mood and affect.  Crying intermittently throughout visit.  Behavior  is normal.       Assessment & Plan:    See Problem List for Assessment and Plan of chronic medical problems.

## 2018-05-01 ENCOUNTER — Encounter: Payer: Self-pay | Admitting: Internal Medicine

## 2018-05-01 ENCOUNTER — Ambulatory Visit (INDEPENDENT_AMBULATORY_CARE_PROVIDER_SITE_OTHER): Payer: Medicare Other | Admitting: Internal Medicine

## 2018-05-01 ENCOUNTER — Encounter

## 2018-05-01 ENCOUNTER — Other Ambulatory Visit (INDEPENDENT_AMBULATORY_CARE_PROVIDER_SITE_OTHER): Payer: Medicare Other

## 2018-05-01 VITALS — BP 142/94 | HR 77 | Temp 98.4°F | Resp 16 | Wt 153.0 lb

## 2018-05-01 DIAGNOSIS — R238 Other skin changes: Secondary | ICD-10-CM

## 2018-05-01 DIAGNOSIS — R233 Spontaneous ecchymoses: Secondary | ICD-10-CM

## 2018-05-01 DIAGNOSIS — F3289 Other specified depressive episodes: Secondary | ICD-10-CM

## 2018-05-01 DIAGNOSIS — F419 Anxiety disorder, unspecified: Secondary | ICD-10-CM | POA: Diagnosis not present

## 2018-05-01 LAB — CBC WITH DIFFERENTIAL/PLATELET
BASOS ABS: 0 10*3/uL (ref 0.0–0.1)
Basophils Relative: 0.1 % (ref 0.0–3.0)
Eosinophils Absolute: 0 10*3/uL (ref 0.0–0.7)
Eosinophils Relative: 0 % (ref 0.0–5.0)
HCT: 40.9 % (ref 36.0–46.0)
HEMOGLOBIN: 14.2 g/dL (ref 12.0–15.0)
LYMPHS ABS: 1 10*3/uL (ref 0.7–4.0)
Lymphocytes Relative: 24.8 % (ref 12.0–46.0)
MCHC: 34.7 g/dL (ref 30.0–36.0)
MCV: 94 fl (ref 78.0–100.0)
Monocytes Absolute: 0.5 10*3/uL (ref 0.1–1.0)
Monocytes Relative: 11.6 % (ref 3.0–12.0)
NEUTROS PCT: 63.5 % (ref 43.0–77.0)
Neutro Abs: 2.6 10*3/uL (ref 1.4–7.7)
Platelets: 273 10*3/uL (ref 150.0–400.0)
RBC: 4.35 Mil/uL (ref 3.87–5.11)
RDW: 12.9 % (ref 11.5–15.5)
WBC: 4.2 10*3/uL (ref 4.0–10.5)

## 2018-05-01 LAB — COMPREHENSIVE METABOLIC PANEL
ALBUMIN: 4.5 g/dL (ref 3.5–5.2)
ALK PHOS: 74 U/L (ref 39–117)
ALT: 12 U/L (ref 0–35)
AST: 12 U/L (ref 0–37)
BILIRUBIN TOTAL: 0.5 mg/dL (ref 0.2–1.2)
BUN: 15 mg/dL (ref 6–23)
CO2: 27 mEq/L (ref 19–32)
Calcium: 9.8 mg/dL (ref 8.4–10.5)
Chloride: 100 mEq/L (ref 96–112)
Creatinine, Ser: 0.82 mg/dL (ref 0.40–1.20)
GFR: 72.62 mL/min (ref >60.00)
GLUCOSE: 106 mg/dL — AB (ref 70–99)
Potassium: 4.6 mEq/L (ref 3.5–5.1)
Sodium: 135 mEq/L (ref 135–145)
Total Protein: 7.1 g/dL (ref 6.0–8.3)

## 2018-05-01 MED ORDER — PAROXETINE HCL 20 MG PO TABS: 20.0000 mg | ORAL_TABLET | Freq: Every day | ORAL | 1 refills | Status: DC

## 2018-05-01 NOTE — Assessment & Plan Note (Signed)
Likely related to NSAID use CBC, CMP

## 2018-05-01 NOTE — Assessment & Plan Note (Signed)
Not controlled Currently taking Paxil 10 mg daily-we will increase to 20 mg daily Advised she may need a higher dose but I anticipate this medication is again temporary Therapist information given Follow-up in 6-8 weeks, sooner if needed

## 2018-05-01 NOTE — Patient Instructions (Addendum)
[facility]  [facility] [place] [facility] [place]  [place], [place]  [facility] [place], [place], [place], [address] [phone] Office  [facility]            [place], [place]     Test(s) ordered today. Your results will be released to [facility] (or called to you) after review, usually within 72hours after test completion. If any changes need to be made, you will be notified at that same time.  Medications reviewed and updated.  Changes include increasing paxil to 20 mg daily.  Your prescription(s) have been submitted to your pharmacy. Please take as directed and contact our office if you believe you are having problem(s) with the medication(s).   Follow up in 6-8 weeks

## 2018-05-01 NOTE — Assessment & Plan Note (Signed)
Not ideally controlled Related to excessive amounts of dental work, chronic pain and some generalized anxiety She did restart the Paxil 10 mg daily-we will increase to 20 mg daily Follow-up 6-8 weeks Names and numbers given for therapist

## 2018-05-21 DIAGNOSIS — L218 Other seborrheic dermatitis: Secondary | ICD-10-CM | POA: Diagnosis not present

## 2018-05-21 DIAGNOSIS — Z85828 Personal history of other malignant neoplasm of skin: Secondary | ICD-10-CM | POA: Diagnosis not present

## 2018-05-21 DIAGNOSIS — L821 Other seborrheic keratosis: Secondary | ICD-10-CM | POA: Diagnosis not present

## 2018-05-21 DIAGNOSIS — D1801 Hemangioma of skin and subcutaneous tissue: Secondary | ICD-10-CM | POA: Diagnosis not present

## 2018-06-11 NOTE — Progress Notes (Signed)
Subjective:    Patient ID: Elizabeth Wilkins, female    DOB: 05-Feb-1946, 72 y.o.   MRN: [id number]  HPI The patient is here for follow up.  Depression, anxiety:  Related to extensive dental work, chronic hip pain, house renovation and overall stress in general.  We increased her paxil to 20 mg daily several weeks ago.  She has been taking the medication daily as prescribed and feels much better.  She feels her anxiety and depression are well controlled.  Her appetite is back and she is eating well.  She is sleeping well.  She has not been exercising regularly, but plans on starting.  She still has extensive dental work to do, but feels she can handle it much better.  Her home renovation is almost complete.  She denies chest pain, palpitations, shortness of breath, headaches.  Her blood pressure here today is elevated, but she does monitor this at home and states it is well controlled and sometimes even on the low side.    Medications and allergies reviewed with patient and updated if appropriate.  Patient Active Problem List   Diagnosis Date Noted  . Easy bruising 05/01/2018  . Bladder spasms 11/27/2017  . Depression 11/27/2017  . Right hip pain 11/22/2017  . Anxiety 11/22/2017  . Closed displaced fracture of right femoral neck ([place]) 08/22/2017  . 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 Herpes simplex 03/30/2017  . Vitamin D deficiency 03/30/2017  . Decreased hearing 03/30/2017  . Hyperglycemia 11/26/2015  . Seborrheic dermatitis 05/14/2012  . Osteopenia 11/18/2010  . Dyslipidemia 11/09/2010  . Elevated blood pressure reading 11/08/2010    Current Outpatient Medications on File Prior to Visit  Medication Sig Dispense Refill  . chlorhexidine (PERIDEX) 0.12 % solution AFTER BRUSHING, RINSE WITH ONE CAPFUL FOR ONE MINUTE TWICE A DAY THEN SPIT OUT  0  . PARoxetine (PAXIL) 20 MG tablet Take 1 tablet (20 mg total) by mouth daily. 90 tablet 1   No current  facility-administered medications on file prior to visit.     Past Medical History:  Diagnosis Date  . Hyperlipidemia   . Hypertension   . Osteopenia     Past Surgical History:  Procedure Laterality Date  . broken wrist Right    plates and screws  . COLONOSCOPY    . 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 TUBAL LIGATION      Social History   Socioeconomic History  . Marital status: Married    Spouse name: Not on file  . Number of children: 3  . Years of education: Not on file  . Highest education level: Not on file  Occupational History  . Occupation: retired  Scientific laboratory technician  . Financial resource strain: Not on file  . Food insecurity:    Worry: Not on file    Inability: Not on file  . Transportation needs:    Medical: Not on file    Non-medical: Not on file  Tobacco Use  . Smoking status: Never Smoker  . Smokeless tobacco: Never Used  Substance and Sexual Activity  . Alcohol use: Yes    Alcohol/week: 21.0 standard drinks    Types: 14 Glasses of wine, 7 Shots of liquor per week    Comment: 2-3 drinks daily  . Drug use: No  . Sexual activity:  Not on file  Lifestyle  . Physical activity:    Days per week: Not on file    Minutes per session: Not on file  . Stress: Not on file  Relationships  . Social connections:    Talks on phone: Not on file    Gets together: Not on file    Attends religious service: Not on file    Active member of club or organization: Not on file    Attends meetings of clubs or organizations: Not on file    Relationship status: Not on file  Other Topics Concern  . 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       No regular exercise    Family History  Problem Relation Age of Onset  . Pancreatic cancer Mother 62  . Hypertension Mother   . Hyperlipidemia Mother   . Rectal cancer Brother 38  . Lupus Father 53  . Colon cancer Neg Hx   . Stomach cancer Neg Hx     Review of Systems  Constitutional: Negative for appetite change.  Respiratory: Negative for shortness of breath.   Cardiovascular: Negative for palpitations.  Neurological: Negative for headaches.  Psychiatric/Behavioral: Negative for sleep disturbance.       Objective:   Vitals:   06/12/18 0955  BP: (!) 168/94  Pulse: (!) 53  Resp: 16  Temp: 98 F (36.7 C)  SpO2: 97%   BP Readings from Last 3 Encounters:  06/12/18 (!) 168/94  05/01/18 (!) 142/94  03/14/18 138/82   Wt Readings from Last 3 Encounters:  06/12/18 161 lb (73 kg)  05/01/18 153 lb (69.4 kg)  03/14/18 166 lb (75.3 kg)   Body mass index is 23.1 kg/m.   Physical Exam    Constitutional: Appears well-developed and well-nourished. No distress.  HENT:  Head: Normocephalic and atraumatic.  Neck: Neck supple. No tracheal deviation present. No thyromegaly present.  No cervical lymphadenopathy Cardiovascular: Normal rate, regular rhythm and normal heart sounds.   No murmur heard. No carotid bruit .  No edema Pulmonary/Chest: Effort normal and breath sounds normal. No respiratory distress. No has no wheezes. No rales.  Skin: Skin is warm and dry. Not diaphoretic.  Psychiatric: Normal mood and affect. Behavior is normal.      Assessment & Plan:    See Problem List for Assessment and Plan of chronic medical problems.

## 2018-06-12 ENCOUNTER — Encounter: Payer: Self-pay | Admitting: Internal Medicine

## 2018-06-12 ENCOUNTER — Ambulatory Visit (INDEPENDENT_AMBULATORY_CARE_PROVIDER_SITE_OTHER): Payer: Medicare Other | Admitting: Internal Medicine

## 2018-06-12 VITALS — BP 168/94 | HR 53 | Temp 98.0°F | Resp 16 | Ht 70.0 in | Wt 161.0 lb

## 2018-06-12 DIAGNOSIS — R030 Elevated blood-pressure reading, without diagnosis of hypertension: Secondary | ICD-10-CM | POA: Diagnosis not present

## 2018-06-12 DIAGNOSIS — F3289 Other specified depressive episodes: Secondary | ICD-10-CM

## 2018-06-12 DIAGNOSIS — Z23 Encounter for immunization: Secondary | ICD-10-CM

## 2018-06-12 DIAGNOSIS — F419 Anxiety disorder, unspecified: Secondary | ICD-10-CM

## 2018-06-12 NOTE — Assessment & Plan Note (Signed)
Depression much better controlled Continue Paxil 20 mg daily-encouraged her to continue this for at least a couple more months, possibly longer depending on circumstances

## 2018-06-12 NOTE — Assessment & Plan Note (Signed)
Anxiety much better controlled with higher dose of Paxil Continue 20 mg Paxil daily She may discontinue this eventually, but stressed taking it for another couple of months-after that if she really wants to try discontinuing it she can try taking 20 mg every other day and slowly taper off to see how she does

## 2018-06-12 NOTE — Assessment & Plan Note (Signed)
Blood pressure elevated here and has been elevated here in the past She does monitor her blood pressure at home routinely and it is well controlled Possibly whitecoat hypertension She will continue to monitor her blood pressure at home

## 2018-06-12 NOTE — Patient Instructions (Signed)
  Flu immunization administered today.    Medications reviewed and updated.  No changes recommended at this time.

## 2018-08-28 DIAGNOSIS — S72031D Displaced midcervical fracture of right femur, subsequent encounter for closed fracture with routine healing: Secondary | ICD-10-CM | POA: Diagnosis not present

## 2018-10-02 DIAGNOSIS — H18892 Other specified disorders of cornea, left eye: Secondary | ICD-10-CM | POA: Diagnosis not present

## 2018-10-05 DIAGNOSIS — H18892 Other specified disorders of cornea, left eye: Secondary | ICD-10-CM | POA: Diagnosis not present

## 2018-11-15 DIAGNOSIS — M79671 Pain in right foot: Secondary | ICD-10-CM | POA: Diagnosis not present

## 2018-12-24 ENCOUNTER — Encounter: Payer: Self-pay | Admitting: Internal Medicine

## 2019-03-06 IMAGING — DX DG KNEE COMPLETE 4+V*R*
4 series · 4 of 4 positions shown · non-contrast
Comparison: 08/21/2017

CLINICAL DATA: Hip fracture

EXAM:
RIGHT KNEE - COMPLETE 4+ VIEW

[knee ap]
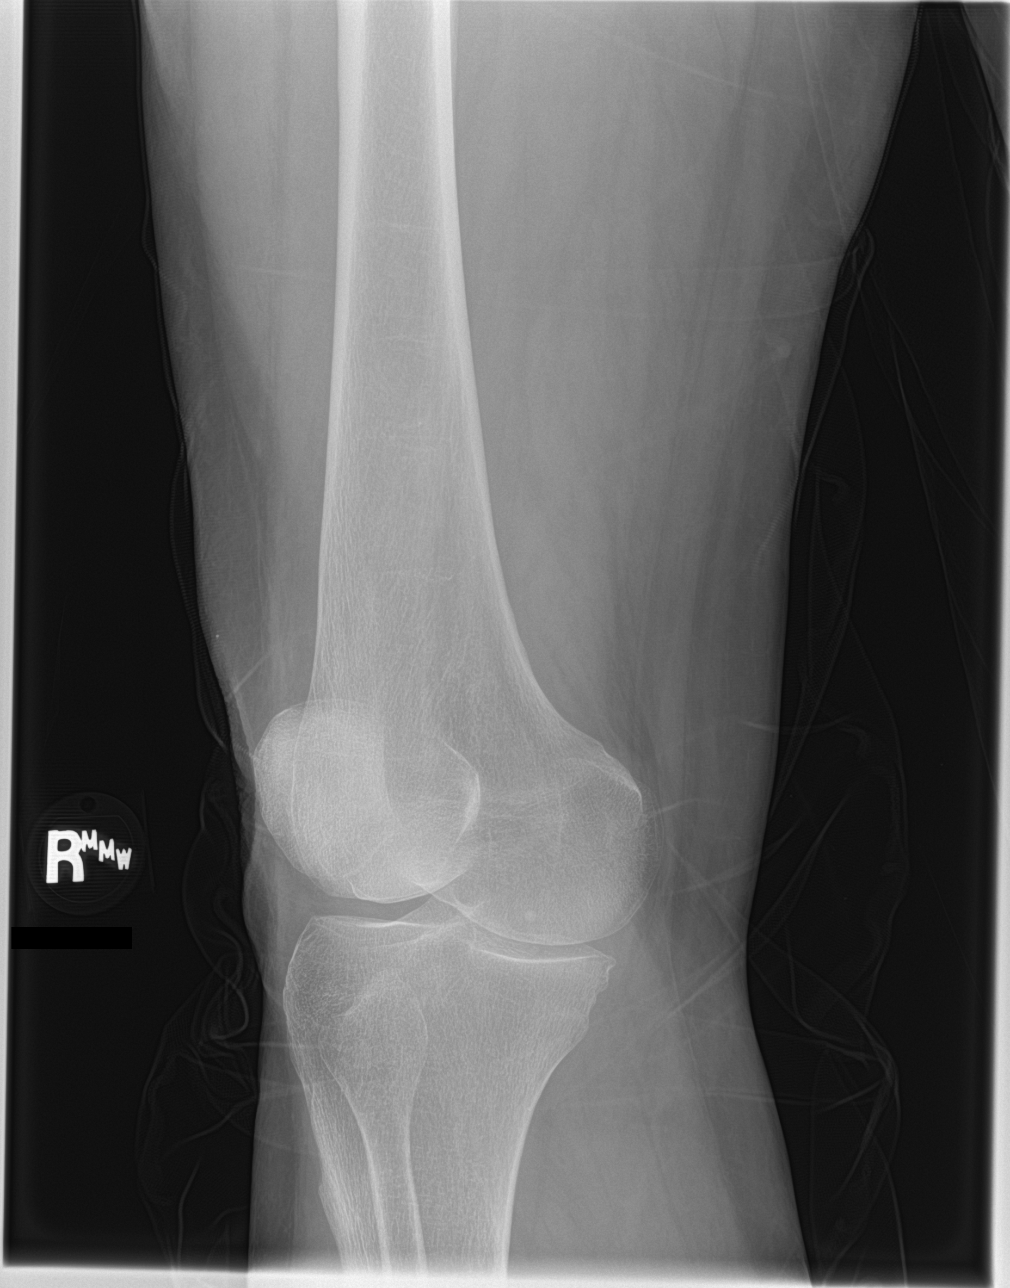

[knee tunnel]
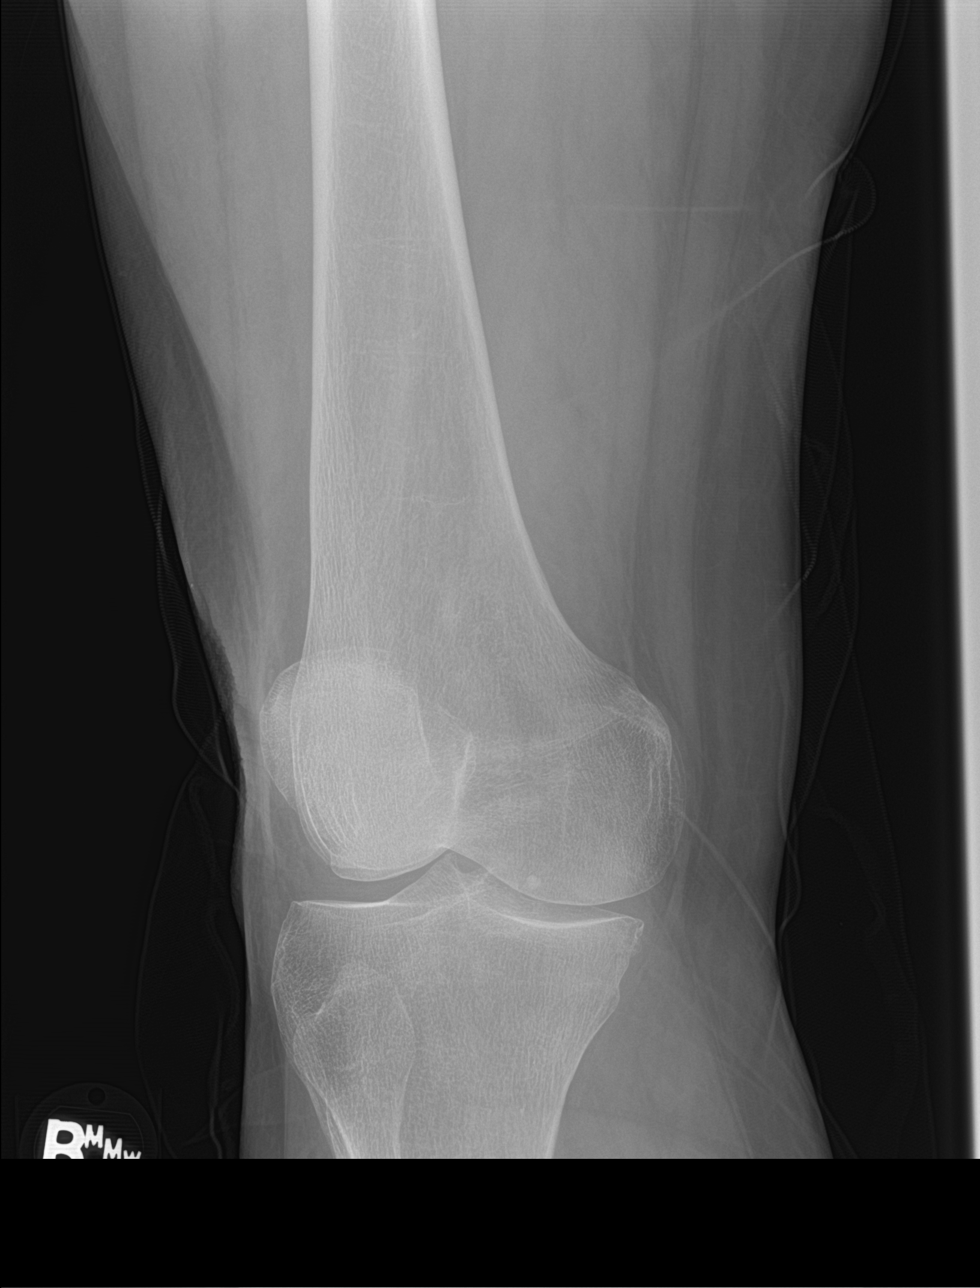

[knee lat]
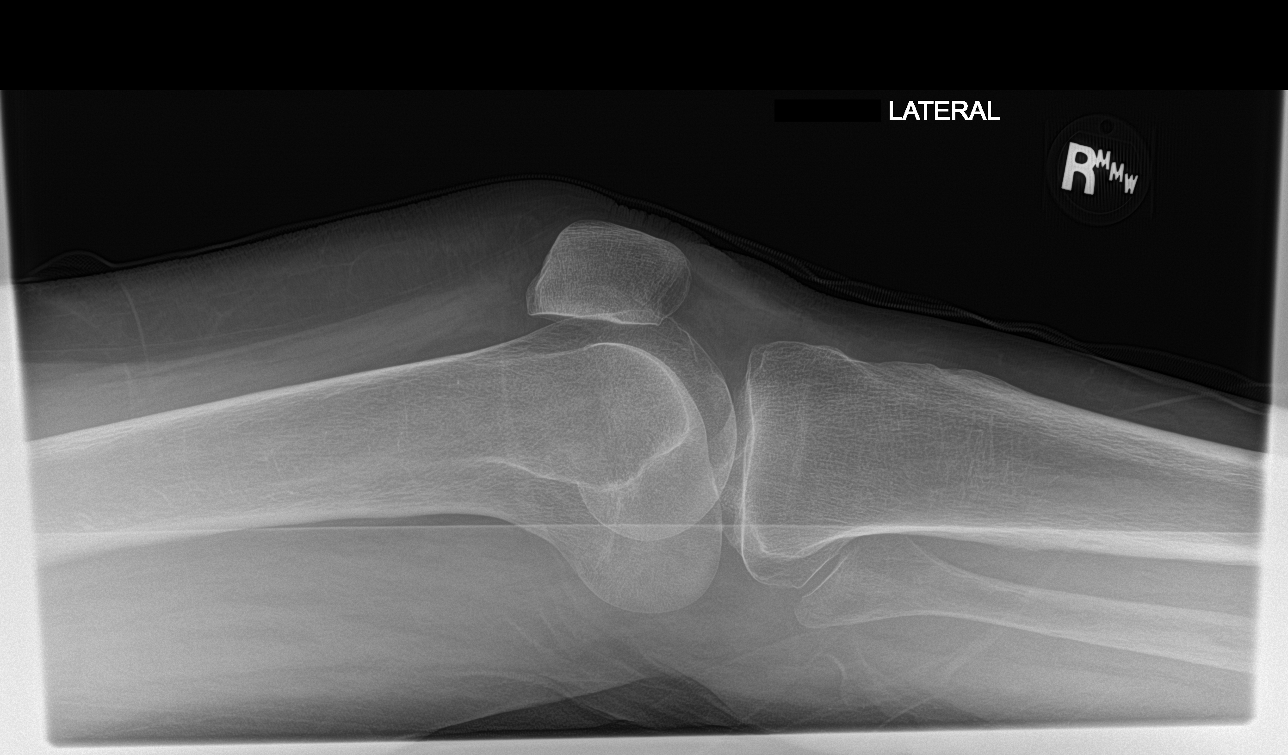

[knee obl]
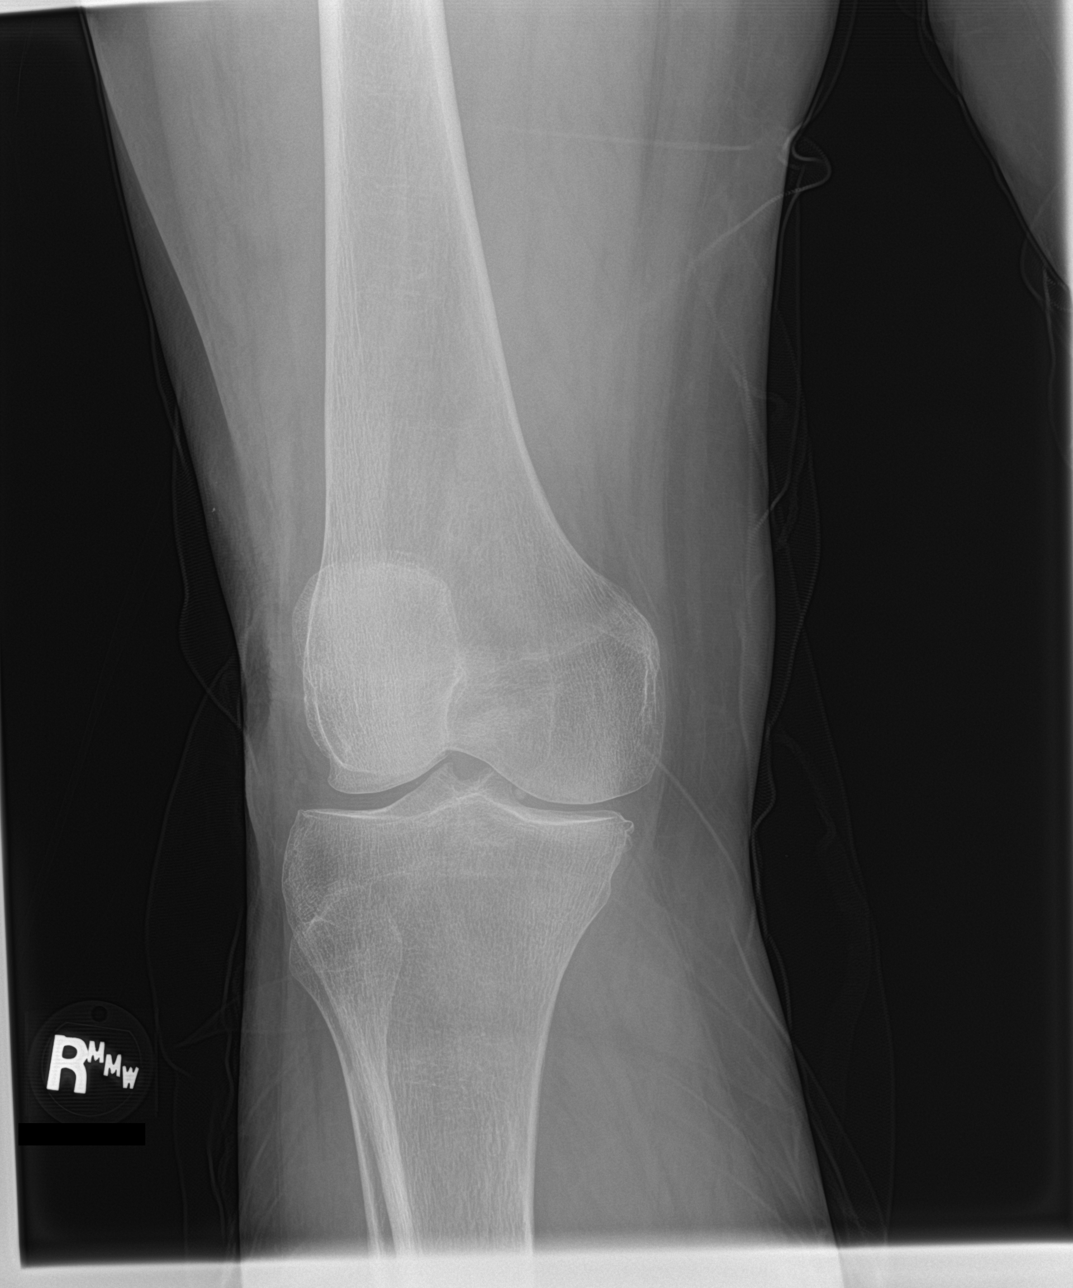

[4 of 4 positions shown; findings below may reference images not displayed]

FINDINGS: Views of the right knee are limited by positioning. Mild lateral
positioning of the patella, suspected to be secondary to
positioning. No fracture. No large knee effusion
IMPRESSION: 1. Limited study secondary to positioning
2. No fracture seen
3. Lateral positioning of the patella, suspect that this is largely
related to positioning

## 2019-03-07 IMAGING — RF DG C-ARM 61-120 MIN-NO REPORT
1 series · 2 of 2 positions shown · non-contrast
Comparison: 08/21/2017

FLUOROSCOPY TIME:  Fluoroscopy Time:  24 seconds.

CLINICAL DATA: Total right hip arthroplasty.

EXAM:
DG C-ARM 61-120 MIN-NO REPORT; OPERATIVE RIGHT HIP WITH PELVIS

[Series 1: run · 2 of 2 slices shown]
[im 1/2]
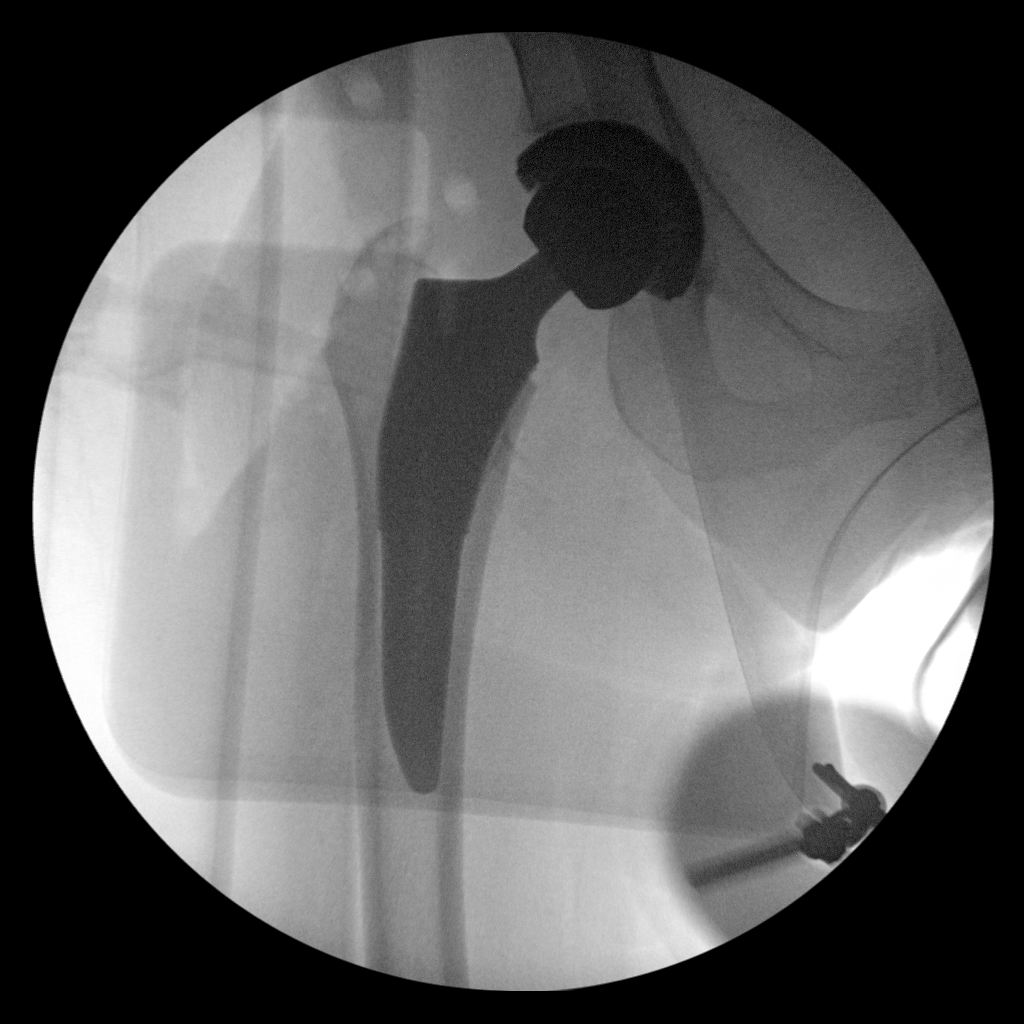
[im 2/2]
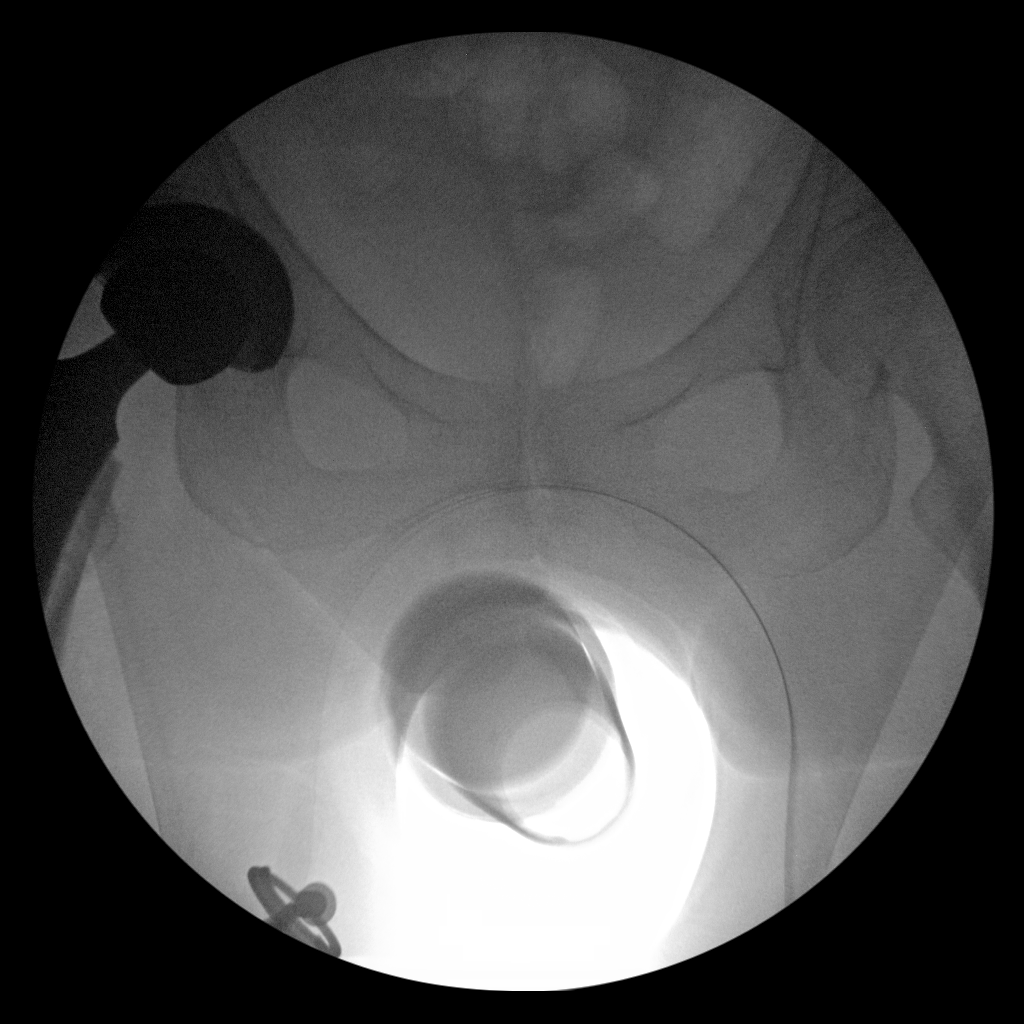

[2 of 2 positions shown; findings below may reference images not displayed]

FINDINGS: Intraoperative fluoroscopic guidance was provided during total right
hip arthroplasty. Two fluoroscopic images demonstrates placement of
3 component right hip prosthesis without evidence of fracture or
immediate complications. The alignment is normal.
IMPRESSION: Fluoroscopic guidance during total right hip arthroplasty without
evidence of immediate complications.

## 2019-04-15 NOTE — Progress Notes (Signed)
Subjective:    Patient ID: Elizabeth Wilkins, female    DOB: [birth year], 73 y.o.   MRN: [id number]  HPI The patient is here for follow up.  She is here with her husband today.  She is still having dental work done.  It has been continuous for the past 4 years.  She is having a lot of pain and has been taking aleve and tylenol.  She has also received pain medication from the dentist intermittently.  Due to her pain and soreness in her mouth she has difficulty eating-she has to force herself to eat.  She feels weak and often just wants to lay in bed.  She does feel depressed and anxious.  She had not been taking the Paxil and just restarted it 3 days ago.   She was wondering what else she could do for the pain-specifically mentioned gabapentin or Lyrica, which someone had mentioned them.  She also wondered about something to take as needed before having her next dental appointment because that does cause intense anxiety.   Medications and allergies reviewed with patient and updated if appropriate.  Patient Active Problem List   Diagnosis Date Noted  . Easy bruising 05/01/2018  . Bladder spasms 11/27/2017  . Depression 11/27/2017  . Right hip pain 11/22/2017  . Anxiety 11/22/2017  . Closed displaced fracture of right femoral neck ([place]) 08/22/2017  . 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 Herpes simplex 03/30/2017  . Vitamin D deficiency 03/30/2017  . Decreased hearing 03/30/2017  . Hyperglycemia 11/26/2015  . Seborrheic dermatitis 05/14/2012  . Osteopenia 11/18/2010  . Dyslipidemia 11/09/2010  . Elevated blood pressure reading 11/08/2010    Current Outpatient Medications on File Prior to Visit  Medication Sig Dispense Refill  . chlorhexidine (PERIDEX) 0.12 % solution AFTER BRUSHING, RINSE WITH ONE CAPFUL FOR ONE MINUTE TWICE A DAY THEN SPIT OUT  0  . PARoxetine (PAXIL) 20 MG tablet Take 1 tablet (20 mg total) by mouth daily. 90 tablet 1   No current  facility-administered medications on file prior to visit.     Past Medical History:  Diagnosis Date  . Hyperlipidemia   . Hypertension   . Osteopenia     Past Surgical History:  Procedure Laterality Date  . broken wrist Right    plates and screws  . COLONOSCOPY    . 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 TUBAL LIGATION      Social History   Socioeconomic History  . Marital status: Married    Spouse name: Not on file  . Number of children: 3  . Years of education: Not on file  . Highest education level: Not on file  Occupational History  . Occupation: retired  Scientific laboratory technician  . Financial resource strain: Not on file  . Food insecurity    Worry: Not on file    Inability: Not on file  . Transportation needs    Medical: Not on file    Non-medical: Not on file  Tobacco Use  . Smoking status: Never Smoker  . Smokeless tobacco: Never Used  Substance and Sexual Activity  . Alcohol use: Yes    Alcohol/week: 21.0 standard drinks    Types: 14 Glasses of wine, 7 Shots of liquor per week    Comment: 2-3 drinks daily  . Drug  use: No  . Sexual activity: Not on file  Lifestyle  . Physical activity    Days per week: Not on file    Minutes per session: Not on file  . Stress: Not on file  Relationships  . Social Herbalist on phone: Not on file    Gets together: Not on file    Attends religious service: Not on file    Active member of club or organization: Not on file    Attends meetings of clubs or organizations: Not on file    Relationship status: Not on file  Other Topics Concern  . 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      No  regular exercise    Family History  Problem Relation Age of Onset  . Pancreatic cancer Mother 50  . Hypertension Mother   . Hyperlipidemia Mother   . Rectal cancer Brother 30  . Lupus Father 63  . Colon cancer Neg Hx   . Stomach cancer Neg Hx     Review of Systems  Constitutional: Positive for appetite change. Negative for unexpected weight change.  Psychiatric/Behavioral: Positive for dysphoric mood. Negative for sleep disturbance. The patient is nervous/anxious.        Objective:   Vitals:   04/16/19 0939  BP: (!) 174/100  Pulse: 77  Resp: 16  Temp: 98.9 F (37.2 C)  SpO2: 98%   BP Readings from Last 3 Encounters:  04/16/19 (!) 174/100  06/12/18 (!) 168/94  05/01/18 (!) 142/94   Wt Readings from Last 3 Encounters:  04/16/19 169 lb (76.7 kg)  06/12/18 161 lb (73 kg)  05/01/18 153 lb (69.4 kg)   Body mass index is 24.25 kg/m.   Physical Exam Constitutional:      General: She is not in acute distress.    Appearance: Normal appearance. She is not ill-appearing.  HENT:     Head: Normocephalic and atraumatic.  Neurological:     Mental Status: She is alert.  Psychiatric:        Behavior: Behavior normal.        Thought Content: Thought content normal.        Judgment: Judgment normal.     Comments: Mood is anxious and depressed             Assessment & Plan:    See Problem List for Assessment and Plan of chronic medical problems.

## 2019-04-16 ENCOUNTER — Encounter: Payer: Self-pay | Admitting: Internal Medicine

## 2019-04-16 ENCOUNTER — Other Ambulatory Visit: Payer: Self-pay

## 2019-04-16 ENCOUNTER — Ambulatory Visit (INDEPENDENT_AMBULATORY_CARE_PROVIDER_SITE_OTHER): Payer: Medicare Other | Admitting: Internal Medicine

## 2019-04-16 VITALS — BP 174/100 | HR 77 | Temp 98.9°F | Resp 16 | Ht 70.0 in | Wt 169.0 lb

## 2019-04-16 DIAGNOSIS — F3289 Other specified depressive episodes: Secondary | ICD-10-CM

## 2019-04-16 DIAGNOSIS — F419 Anxiety disorder, unspecified: Secondary | ICD-10-CM

## 2019-04-16 MED ORDER — GABAPENTIN 100 MG PO CAPS: 100.0000 mg | ORAL_CAPSULE | Freq: Every day | ORAL | 5 refills | Status: DC

## 2019-04-16 MED ORDER — ALPRAZOLAM 0.25 MG PO TABS: ORAL_TABLET | ORAL | 0 refills | Status: DC

## 2019-04-16 NOTE — Assessment & Plan Note (Signed)
Having increased anxiety related to extensive and non-ending dental work that has resulted in multiple complications She is also on chronic pain, having difficulty eating and sleeping Her anxiety and depression are increased She did restart the Paxil 3 days ago and she needs to continue this.  Can consider increasing the dose after 3-4 weeks We will try gabapentin 100 mg at bedtime, which will hopefully help some with anxiety and some with chronic dental pain/nerve pain Have also given her alprazolam to take prior to dental appointment, which her husband will drive her to.  We will only give her 5 pills.  Discussed that this is highly addicting and I do not want her taking on a regular basis

## 2019-04-16 NOTE — Assessment & Plan Note (Addendum)
Having increased anxiety related to extensive and non-ending dental work that has resulted in multiple complications She is also on chronic pain, having difficulty eating and sleeping Her anxiety and depression are increased She did restart the Paxil 3 days ago and she needs to continue this.  Can consider increasing the dose after 3-4 weeks We will try gabapentin 100 mg at bedtime, which will hopefully help some with anxiety and some with chronic dental pain/nerve pain-discussed possible side effects of gabapentin.  Discussed that we can titrate slowly if this is tolerated and effective

## 2019-04-16 NOTE — Patient Instructions (Addendum)
  Medications reviewed and updated.  Changes include :   Start gabapentin 100 mg at bedtime.  Take xanax prior to dental work if needed.   Your prescription(s) have been submitted to your pharmacy. Please take as directed and contact our office if you believe you are having problem(s) with the medication(s).    Please send me a note via mychart how you do with this medication.

## 2019-04-17 ENCOUNTER — Encounter: Payer: Self-pay | Admitting: Internal Medicine

## 2019-04-24 ENCOUNTER — Encounter: Payer: Self-pay | Admitting: Internal Medicine

## 2019-04-26 ENCOUNTER — Encounter: Payer: Self-pay | Admitting: Internal Medicine

## 2019-04-26 ENCOUNTER — Other Ambulatory Visit: Payer: Self-pay | Admitting: Internal Medicine

## 2019-04-26 DIAGNOSIS — N644 Mastodynia: Secondary | ICD-10-CM

## 2019-04-26 DIAGNOSIS — Z1231 Encounter for screening mammogram for malignant neoplasm of breast: Secondary | ICD-10-CM

## 2019-04-27 ENCOUNTER — Encounter: Payer: Self-pay | Admitting: Internal Medicine

## 2019-05-02 ENCOUNTER — Telehealth: Payer: Self-pay | Admitting: Internal Medicine

## 2019-05-02 MED ORDER — GABAPENTIN 100 MG PO CAPS: ORAL_CAPSULE | ORAL | 1 refills | Status: DC

## 2019-05-02 NOTE — Telephone Encounter (Signed)
Medication Refill - Medication: gabapentin (NEURONTIN) 100 MG capsule  Has the patient contacted their pharmacy? Yes - states the refill is too early, however Dr. Quay Burow told her to take 2 a night instead of one a night and she needs to make sure that is reflected in the RX.  Pt states that she only has enough for one night left (Agent: If no, request that the patient contact the pharmacy for the refill.) (Agent: If yes, when and what did the pharmacy advise?)  Preferred Pharmacy (with phone number or street name):  [id number] - [place], [place]. AT [address] [phone] (Phone) [phone] (Fax)   Agent: Please be advised that RX refills may take up to 3 business days. We ask that you follow-up with your pharmacy.

## 2019-05-02 NOTE — Telephone Encounter (Signed)
Rx updated and sent in to pharmacy.

## 2019-05-08 ENCOUNTER — Other Ambulatory Visit: Payer: Self-pay

## 2019-05-08 ENCOUNTER — Encounter: Payer: Self-pay | Admitting: Internal Medicine

## 2019-05-08 ENCOUNTER — Ambulatory Visit (INDEPENDENT_AMBULATORY_CARE_PROVIDER_SITE_OTHER): Payer: Medicare Other | Admitting: Internal Medicine

## 2019-05-08 DIAGNOSIS — I10 Essential (primary) hypertension: Secondary | ICD-10-CM | POA: Insufficient documentation

## 2019-05-08 MED ORDER — NEBIVOLOL HCL 5 MG PO TABS: 5.0000 mg | ORAL_TABLET | Freq: Every day | ORAL | 5 refills | Status: DC

## 2019-05-08 NOTE — Progress Notes (Signed)
Subjective:    Patient ID: Elizabeth Wilkins, female    DOB: 12/08/1945, 73 y.o.   MRN: [id number]  HPI The patient is here for an acute visit for elevated blood pressure.  She has had elevated blood pressure in the past and was on medication for short period of time.  She was concerned about the side effects and stopped taking it.  She did monitor her pressure for a while at home and it was good, but has not checked it recently.  She has been experiencing episodes of lightheadedness, weakness and changes in her vision.  She got herself a new blood pressure and she started checking her BP and it has been elevated. 149/82, 170/104, 150/86, 147/89, 126/75, 148/87, 170/95, 180/100, 120/95, 173/92.  She is compliant with a low-sodium diet.  She feels like her anxiety is currently well controlled.   Medications and allergies reviewed with patient and updated if appropriate.  Patient Active Problem List   Diagnosis Date Noted  . Easy bruising 05/01/2018  . Bladder spasms 11/27/2017  . Depression 11/27/2017  . Right hip pain 11/22/2017  . Anxiety 11/22/2017  . Closed displaced fracture of right femoral neck ([place]) 08/22/2017  . 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 Herpes simplex 03/30/2017  . Vitamin D deficiency 03/30/2017  . Decreased hearing 03/30/2017  . Hyperglycemia 11/26/2015  . Seborrheic dermatitis 05/14/2012  . Osteopenia 11/18/2010  . Dyslipidemia 11/09/2010  . Elevated blood pressure reading 11/08/2010    Current Outpatient Medications on File Prior to Visit  Medication Sig Dispense Refill  . ALPRAZolam (XANAX) 0.25 MG tablet Take one prior to dental work, you can repeat x 1 if needed 5 tablet 0  . chlorhexidine (PERIDEX) 0.12 % solution AFTER BRUSHING, RINSE WITH ONE CAPFUL FOR ONE MINUTE TWICE A DAY THEN SPIT OUT  0  . gabapentin (NEURONTIN) 100 MG capsule Take 2 capsules (200 mg total) by mouth at bedtime 60 capsule 1  . PARoxetine (PAXIL) 20 MG tablet Take  1 tablet (20 mg total) by mouth daily. (Patient taking differently: Take 10 mg by mouth daily. 1/2 tablet daily) 90 tablet 1   No current facility-administered medications on file prior to visit.     Past Medical History:  Diagnosis Date  . Hyperlipidemia   . Hypertension   . Osteopenia     Past Surgical History:  Procedure Laterality Date  . broken wrist Right    plates and screws  . COLONOSCOPY    . 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 TUBAL LIGATION      Social History   Socioeconomic History  . Marital status: Married    Spouse name: Not on file  . Number of children: 3  . Years of education: Not on file  . Highest education level: Not on file  Occupational History  . Occupation: retired  Scientific laboratory technician  . Financial resource strain: Not on file  . Food insecurity    Worry: Not on file    Inability: Not on file  . Transportation needs    Medical: Not on file    Non-medical: Not on file  Tobacco Use  . Smoking status: Never Smoker  . Smokeless tobacco: Never Used  Substance and Sexual Activity  . Alcohol use: Yes    Alcohol/week: 21.0 standard drinks  Types: 14 Glasses of wine, 7 Shots of liquor per week    Comment: 2-3 drinks daily  . Drug use: No  . Sexual activity: Not on file  Lifestyle  . Physical activity    Days per week: Not on file    Minutes per session: Not on file  . Stress: Not on file  Relationships  . Social Herbalist on phone: Not on file    Gets together: Not on file    Attends religious service: Not on file    Active member of club or organization: Not on file    Attends meetings of clubs or organizations: Not on file    Relationship status: Not on file  Other Topics Concern  . 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      No regular exercise    Family History  Problem Relation Age of Onset  . Pancreatic cancer Mother 66  . Hypertension Mother   . Hyperlipidemia Mother   . Rectal cancer Brother 57  . Lupus Father 9  . Colon cancer Neg Hx   . Stomach cancer Neg Hx     Review of Systems  Constitutional: Negative for fever.  Eyes: Positive for visual disturbance.  Respiratory: Negative for shortness of breath.   Cardiovascular: Negative for chest pain, palpitations and leg swelling.  Neurological: Positive for light-headedness. Negative for headaches.       Objective:   Vitals:   05/08/19 1418  BP: (!) 164/96  Pulse: 65  Resp: 16  Temp: 98.8 F (37.1 C)  SpO2: 97%   BP Readings from Last 3 Encounters:  05/08/19 (!) 164/96  04/16/19 (!) 174/100  06/12/18 (!) 168/94   Wt Readings from Last 3 Encounters:  05/08/19 172 lb (78 kg)  04/16/19 169 lb (76.7 kg)  06/12/18 161 lb (73 kg)   Body mass index is 24.68 kg/m.   Physical Exam    Constitutional: Appears well-developed and well-nourished. No distress.  HENT:  Head: Normocephalic and atraumatic.  Neck: Neck supple. No tracheal deviation present. No thyromegaly present.  No cervical lymphadenopathy Cardiovascular: Normal rate, regular rhythm and normal heart sounds.   No murmur heard. No carotid bruit .  No edema Pulmonary/Chest: Effort normal and breath sounds normal. No respiratory distress. No has no wheezes. No rales.  Skin: Skin is warm and dry. Not diaphoretic.  Psychiatric: Normal mood and affect. Behavior is normal.       Assessment & Plan:    See Problem List for Assessment and Plan of chronic medical problems.

## 2019-05-08 NOTE — Assessment & Plan Note (Signed)
She was on medication in the past-losartan, was concerned about side effects and stopped the medication and her blood pressure was controlled for a while Recently discovered her blood pressure has been elevated and she has been having symptoms-lightheadedness, weakness and blurry vision Start Bystolic 5 mg daily She will monitor her blood pressure at home She has a follow-up next month and we can follow-up at that time and do blood work Advised her to call with any questions or concerns prior

## 2019-05-08 NOTE — Patient Instructions (Addendum)
  Monitor your BP at home - the goal is < 140/90.    Medications reviewed and updated.  Changes include :   bystolic 5 mg dialy  Your prescription(s) have been submitted to your pharmacy. Please take as directed and contact our office if you believe you are having problem(s) with the medication(s).     Follow up in September as scheduled.

## 2019-05-09 ENCOUNTER — Ambulatory Visit: Payer: Medicare Other

## 2019-05-09 ENCOUNTER — Ambulatory Visit
Admission: RE | Admit: 2019-05-09 | Discharge: 2019-05-09 | Disposition: A | Discharge status: Home / Self Care | Payer: Medicare Other | Source: Ambulatory Visit | Attending: Internal Medicine | Admitting: Internal Medicine

## 2019-05-09 DIAGNOSIS — R928 Other abnormal and inconclusive findings on diagnostic imaging of breast: Secondary | ICD-10-CM | POA: Diagnosis not present

## 2019-05-09 DIAGNOSIS — N644 Mastodynia: Secondary | ICD-10-CM

## 2019-06-01 ENCOUNTER — Other Ambulatory Visit: Payer: Self-pay

## 2019-06-01 ENCOUNTER — Ambulatory Visit: Payer: Medicare Other

## 2019-06-11 ENCOUNTER — Encounter: Payer: Self-pay | Admitting: Internal Medicine

## 2019-06-13 NOTE — Patient Instructions (Addendum)
  Tests ordered today. Your results will be released to [facility] (or called to you) after review.  If any changes need to be made, you will be notified at that same time.  All other Health Maintenance issues reviewed.   All recommended immunizations and age-appropriate screenings are up-to-date or discussed.  Flu immunization administered today.    Medications reviewed and updated.  Changes include :   Stop bystolic.  Start olmesartan 20 mg daily  Your prescription(s) have been submitted to your pharmacy. Please take as directed and contact our office if you believe you are having problem(s) with the medication(s).   Please followup in 6 months   Your BP should be less than 140/90.

## 2019-06-13 NOTE — Progress Notes (Signed)
Subjective:    Patient ID: Elizabeth Wilkins, female    DOB: 11-Dec-1945, 73 y.o.   MRN: [id number]  HPI The patient is here for follow up.    Hypertension: She is taking her medication daily.  She ideally does not like the medication because she does typically drink caffeine all morning and alcohol in the evening.  She knows that she should not do that with the medication.  She is compliant with a low sodium diet.  She denies chest pain, palpitations, edema, shortness of breath and regular headaches. She does monitor her blood pressure at home-it has still been elevated.    Anxiety: She is not taking her gabapentin nightly-she did take this for a while and she thinks it did help.  She is taking the paroxetine daily.  This does help.  She did use the Xanax as needed prior to dental appointments-she did not even realize she had taken it.  She denies any side effects from the medication. She feels her anxiety is well controlled and she is happy with her current dose of medication.   Depression: She is taking her medication daily as prescribed. She denies any side effects from the medication. She feels her depression is well controlled and she is happy with her current dose of medication.   Chronic dental pain: She did take the gabapentin nightly for a while, but is not currently taking it.  She feels her pain is tolerable..    hyperglycemia:  She is compliant with a low sugar/carbohydrate diet.    Hyperlipidemia: She is controlling her cholesterol with diet. She is compliant with a low fat/cholesterol diet.    Medications and allergies reviewed with patient and updated if appropriate.  Patient Active Problem List   Diagnosis Date Noted  . Uncontrolled hypertension 05/08/2019  . Easy bruising 05/01/2018  . Bladder spasms 11/27/2017  . Depression 11/27/2017  . Right hip pain 11/22/2017  . Anxiety 11/22/2017  . Closed displaced fracture of right femoral neck ([place]) 08/22/2017  . Lichen  sclerosus of female genitalia 03/30/2017  . Herpes simplex 03/30/2017  . Vitamin D deficiency 03/30/2017  . Decreased hearing 03/30/2017  . Hyperglycemia 11/26/2015  . Seborrheic dermatitis 05/14/2012  . Osteopenia 11/18/2010  . Dyslipidemia 11/09/2010    Current Outpatient Medications on File Prior to Visit  Medication Sig Dispense Refill  . ALPRAZolam (XANAX) 0.25 MG tablet Take one prior to dental work, you can repeat x 1 if needed 5 tablet 0  . chlorhexidine (PERIDEX) 0.12 % solution AFTER BRUSHING, RINSE WITH ONE CAPFUL FOR ONE MINUTE TWICE A DAY THEN SPIT OUT  0  . gabapentin (NEURONTIN) 100 MG capsule Take 2 capsules (200 mg total) by mouth at bedtime 60 capsule 1  . nebivolol (BYSTOLIC) 5 MG tablet Take 1 tablet (5 mg total) by mouth daily. 30 tablet 5  . PARoxetine (PAXIL) 20 MG tablet Take 1 tablet (20 mg total) by mouth daily. (Patient taking differently: Take 10 mg by mouth daily. 1/2 tablet daily) 90 tablet 1   No current facility-administered medications on file prior to visit.     Past Medical History:  Diagnosis Date  . Hyperlipidemia   . Hypertension   . Osteopenia     Past Surgical History:  Procedure Laterality Date  . broken wrist Right    plates and screws  . COLONOSCOPY    . 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 TUBAL LIGATION      Social History   Socioeconomic History  . Marital status: Married    Spouse name: Not on file  . Number of children: 3  . Years of education: Not on file  . Highest education level: Not on file  Occupational History  . Occupation: retired  Scientific laboratory technician  . Financial resource strain: Not on file  . Food insecurity    Worry: Not on file    Inability: Not on file  . Transportation needs    Medical: Not on file    Non-medical: Not on file   Tobacco Use  . Smoking status: Never Smoker  . Smokeless tobacco: Never Used  Substance and Sexual Activity  . Alcohol use: Yes    Alcohol/week: 21.0 standard drinks    Types: 14 Glasses of wine, 7 Shots of liquor per week    Comment: 2-3 drinks daily  . Drug use: No  . Sexual activity: Not on file  Lifestyle  . Physical activity    Days per week: Not on file    Minutes per session: Not on file  . Stress: Not on file  Relationships  . Social Herbalist on phone: Not on file    Gets together: Not on file    Attends religious service: Not on file    Active member of club or organization: Not on file    Attends meetings of clubs or organizations: Not on file    Relationship status: Not on file  Other Topics Concern  . 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      No regular exercise    Family History  Problem Relation Age of Onset  . Pancreatic cancer Mother 17  . Hypertension Mother   . Hyperlipidemia Mother   . Rectal cancer Brother 68  . Lupus Father 34  . Colon cancer Neg Hx   . Stomach cancer Neg Hx     Review of Systems  Constitutional: Negative for chills and fever.  Respiratory: Negative for cough, shortness of breath and wheezing.   Cardiovascular: Negative for chest pain, palpitations and leg swelling.  Neurological: Negative for light-headedness and headaches.       Objective:   Vitals:   06/14/19 0937  BP: (!) 162/94  Pulse: (!) 54  Resp: 16  Temp: 98.6 F (37 C)  SpO2: 98%   BP Readings from Last 3 Encounters:  06/14/19 (!) 162/94  05/08/19 (!) 164/96  04/16/19 (!) 174/100   Wt Readings from Last 3 Encounters:  06/14/19 178 lb (80.7 kg)  05/08/19 172 lb (78 kg)  04/16/19 169 lb (76.7 kg)   Body mass index is 25.54 kg/m.   Physical Exam    Constitutional: Appears  well-developed and well-nourished. No distress.  HENT:  Head: Normocephalic and atraumatic.  Neck: Neck supple. No tracheal deviation present. No thyromegaly present.  No cervical lymphadenopathy Cardiovascular: Normal rate, regular rhythm and normal heart sounds.   No murmur heard. No carotid bruit .  No edema Pulmonary/Chest: Effort normal and breath sounds normal. No respiratory distress. No has no wheezes. No rales.  Skin: Skin is warm and dry. Not diaphoretic.  Psychiatric: Normal mood and affect. Behavior is  normal.      Assessment & Plan:    He is not interested in having another colonoscopy-she thinks the risks of the procedure may outweigh the benefits.  She does agree to [employer] and realizes that if this is positive she will need to have a colonoscopy.  We will have Cologuard sent to her.  See Problem List for Assessment and Plan of chronic medical problems.

## 2019-06-14 ENCOUNTER — Ambulatory Visit (INDEPENDENT_AMBULATORY_CARE_PROVIDER_SITE_OTHER): Payer: Medicare Other | Admitting: Internal Medicine

## 2019-06-14 ENCOUNTER — Encounter: Payer: Self-pay | Admitting: Internal Medicine

## 2019-06-14 ENCOUNTER — Other Ambulatory Visit: Payer: Self-pay

## 2019-06-14 VITALS — BP 162/94 | HR 54 | Temp 98.6°F | Resp 16 | Ht 70.0 in | Wt 178.0 lb

## 2019-06-14 DIAGNOSIS — I10 Essential (primary) hypertension: Secondary | ICD-10-CM | POA: Diagnosis not present

## 2019-06-14 DIAGNOSIS — R739 Hyperglycemia, unspecified: Secondary | ICD-10-CM

## 2019-06-14 DIAGNOSIS — F3289 Other specified depressive episodes: Secondary | ICD-10-CM

## 2019-06-14 DIAGNOSIS — F419 Anxiety disorder, unspecified: Secondary | ICD-10-CM

## 2019-06-14 DIAGNOSIS — M85859 Other specified disorders of bone density and structure, unspecified thigh: Secondary | ICD-10-CM | POA: Diagnosis not present

## 2019-06-14 DIAGNOSIS — Z23 Encounter for immunization: Secondary | ICD-10-CM

## 2019-06-14 DIAGNOSIS — E559 Vitamin D deficiency, unspecified: Secondary | ICD-10-CM

## 2019-06-14 DIAGNOSIS — E785 Hyperlipidemia, unspecified: Secondary | ICD-10-CM

## 2019-06-14 MED ORDER — OLMESARTAN MEDOXOMIL 20 MG PO TABS: 20.0000 mg | ORAL_TABLET | Freq: Every day | ORAL | 5 refills | Status: DC

## 2019-06-14 NOTE — Assessment & Plan Note (Signed)
History of osteopenia with high FRAX and is likely still a candidate for treatment Did not tolerate bisphosphonate in the past At this point she does not think she would take medication if needed-Prolia would be the best medication for her to try Deferred DEXA at this time, but she will think about it Encourage regular exercise We will check vitamin D level, TSH, CMP

## 2019-06-14 NOTE — Assessment & Plan Note (Signed)
Not controlled Would prefer to switch Bystolic to something else Discussed options We will try olmesartan 20 mg daily She will monitor at home-discussed that goal is less than 140/90 and she will update me via my chart Will titrate medication if needed CMP, CBC, TSH today Follow-up in 6 months

## 2019-06-14 NOTE — Assessment & Plan Note (Signed)
Check vitamin D level 

## 2019-06-14 NOTE — Assessment & Plan Note (Signed)
A1c

## 2019-06-14 NOTE — Assessment & Plan Note (Signed)
Controlled, stable Continue current dose of medication - paxil 10 mg

## 2019-06-14 NOTE — Assessment & Plan Note (Signed)
Controlled, stable Continue current dose of medication - paxil 10 mg daily Did take xanax prior to dental appt - not sure if it helped - can increase to 0.5 mg if needed

## 2019-06-14 NOTE — Assessment & Plan Note (Signed)
Check lipid panel, cmp, tsh Regular exercise and healthy diet encouraged   

## 2019-06-24 ENCOUNTER — Other Ambulatory Visit (INDEPENDENT_AMBULATORY_CARE_PROVIDER_SITE_OTHER): Payer: Medicare Other

## 2019-06-24 DIAGNOSIS — M85859 Other specified disorders of bone density and structure, unspecified thigh: Secondary | ICD-10-CM | POA: Diagnosis not present

## 2019-06-24 DIAGNOSIS — E559 Vitamin D deficiency, unspecified: Secondary | ICD-10-CM | POA: Diagnosis not present

## 2019-06-24 DIAGNOSIS — I10 Essential (primary) hypertension: Secondary | ICD-10-CM

## 2019-06-24 DIAGNOSIS — F419 Anxiety disorder, unspecified: Secondary | ICD-10-CM | POA: Diagnosis not present

## 2019-06-24 DIAGNOSIS — R739 Hyperglycemia, unspecified: Secondary | ICD-10-CM | POA: Diagnosis not present

## 2019-06-24 DIAGNOSIS — E785 Hyperlipidemia, unspecified: Secondary | ICD-10-CM

## 2019-06-24 LAB — COMPREHENSIVE METABOLIC PANEL
ALT: 15 U/L (ref 0–35)
AST: 16 U/L (ref 0–37)
Albumin: 4.4 g/dL (ref 3.5–5.2)
Alkaline Phosphatase: 108 U/L (ref 39–117)
BUN: 19 mg/dL (ref 6–23)
CO2: 26 mEq/L (ref 19–32)
Calcium: 9.8 mg/dL (ref 8.4–10.5)
Chloride: 106 mEq/L (ref 96–112)
Creatinine, Ser: 0.88 mg/dL (ref 0.40–1.20)
GFR: 62.78 mL/min (ref >60.00)
Glucose, Bld: 101 mg/dL — ABNORMAL HIGH (ref 70–99)
Potassium: 5.1 mEq/L (ref 3.5–5.1)
Sodium: 140 mEq/L (ref 135–145)
Total Bilirubin: 0.4 mg/dL (ref 0.2–1.2)
Total Protein: 7 g/dL (ref 6.0–8.3)

## 2019-06-24 LAB — CBC WITH DIFFERENTIAL/PLATELET
Basophils Absolute: 0 10*3/uL (ref 0.0–0.1)
Basophils Relative: 0.1 % (ref 0.0–3.0)
Eosinophils Absolute: 0 10*3/uL (ref 0.0–0.7)
Eosinophils Relative: 0 % (ref 0.0–5.0)
HCT: 38.2 % (ref 36.0–46.0)
Hemoglobin: 13.1 g/dL (ref 12.0–15.0)
Lymphocytes Relative: 27.1 % (ref 12.0–46.0)
Lymphs Abs: 1.2 10*3/uL (ref 0.7–4.0)
MCHC: 34.2 g/dL (ref 30.0–36.0)
MCV: 95.3 fl (ref 78.0–100.0)
Monocytes Absolute: 0.4 10*3/uL (ref 0.1–1.0)
Monocytes Relative: 8.9 % (ref 3.0–12.0)
Neutro Abs: 2.8 10*3/uL (ref 1.4–7.7)
Neutrophils Relative %: 63.9 % (ref 43.0–77.0)
Platelets: 263 10*3/uL (ref 150.0–400.0)
RBC: 4.01 Mil/uL (ref 3.87–5.11)
RDW: 13.1 % (ref 11.5–15.5)
WBC: 4.3 10*3/uL (ref 4.0–10.5)

## 2019-06-24 LAB — LIPID PANEL
Cholesterol: 205 mg/dL — ABNORMAL HIGH (ref 0–200)
HDL: 49.5 mg/dL (ref >39.00)
LDL Cholesterol: 127 mg/dL — ABNORMAL HIGH (ref 0–99)
NonHDL: 155.3
Total CHOL/HDL Ratio: 4
Triglycerides: 140 mg/dL (ref 0.0–149.0)
VLDL: 28 mg/dL (ref 0.0–40.0)

## 2019-06-24 LAB — HEMOGLOBIN A1C: Hgb A1c MFr Bld: 5.1 % (ref 4.6–6.5)

## 2019-06-24 LAB — TSH: TSH: 1.64 u[IU]/mL (ref 0.35–4.50)

## 2019-06-24 LAB — VITAMIN D 25 HYDROXY (VIT D DEFICIENCY, FRACTURES): VITD: 20.64 ng/mL — ABNORMAL LOW (ref 30.00–100.00)

## 2019-06-25 ENCOUNTER — Encounter: Payer: Self-pay | Admitting: Internal Medicine

## 2019-07-05 ENCOUNTER — Encounter: Payer: Self-pay | Admitting: Internal Medicine

## 2019-07-22 DIAGNOSIS — M79672 Pain in left foot: Secondary | ICD-10-CM | POA: Diagnosis not present

## 2019-08-19 DIAGNOSIS — M79672 Pain in left foot: Secondary | ICD-10-CM | POA: Diagnosis not present

## 2019-10-13 ENCOUNTER — Other Ambulatory Visit: Payer: Self-pay | Admitting: Internal Medicine

## 2019-11-04 ENCOUNTER — Ambulatory Visit: Payer: Medicare Other | Attending: Internal Medicine

## 2019-11-04 DIAGNOSIS — Z23 Encounter for immunization: Secondary | ICD-10-CM

## 2019-11-04 NOTE — Progress Notes (Signed)
   Covid-19 Vaccination Clinic  Name:  Elizabeth Wilkins    MRN: [id number] DOB: [birth year]  11/04/2019  [name] was observed post Covid-19 immunization for 15 minutes without incidence. She was provided with Vaccine Information Sheet and instruction to access the V-Safe system.   [name] was instructed to call 911 with any severe reactions post vaccine: Marland Kitchen Difficulty breathing  . Swelling of your face and throat  . A fast heartbeat  . A bad rash all over your body  . Dizziness and weakness    Immunizations Administered    Name Date Dose VIS Date Route   Pfizer COVID-19 Vaccine 11/04/2019  6:02 PM 0.3 mL 09/06/2019 Intramuscular   Manufacturer: [place]   Lot: [id number]   [place]: [id number]

## 2019-11-29 ENCOUNTER — Ambulatory Visit: Payer: Medicare Other | Attending: Internal Medicine

## 2019-11-29 DIAGNOSIS — Z23 Encounter for immunization: Secondary | ICD-10-CM | POA: Insufficient documentation

## 2019-11-29 NOTE — Progress Notes (Signed)
   Covid-19 Vaccination Clinic  Name:  Elizabeth Wilkins    MRN: [id number] DOB: 11-02-1945  11/29/2019  [name] was observed post Covid-19 immunization for 15 minutes without incident. She was provided with Vaccine Information Sheet and instruction to access the V-Safe system.   [name] was instructed to call 911 with any severe reactions post vaccine: Marland Kitchen Difficulty breathing  . Swelling of face and throat  . A fast heartbeat  . A bad rash all over body  . Dizziness and weakness   Immunizations Administered    Name Date Dose VIS Date Route   Pfizer COVID-19 Vaccine 11/29/2019  5:38 PM 0.3 mL 09/06/2019 Intramuscular   Manufacturer: [place]   Lot: [id number]   [place]: [id number]

## 2019-12-03 ENCOUNTER — Encounter: Payer: Self-pay | Admitting: Internal Medicine

## 2019-12-05 ENCOUNTER — Ambulatory Visit (INDEPENDENT_AMBULATORY_CARE_PROVIDER_SITE_OTHER): Payer: Medicare Other | Admitting: Family

## 2019-12-05 ENCOUNTER — Ambulatory Visit (INDEPENDENT_AMBULATORY_CARE_PROVIDER_SITE_OTHER): Payer: Medicare Other

## 2019-12-05 ENCOUNTER — Encounter: Payer: Self-pay | Admitting: Family

## 2019-12-05 ENCOUNTER — Other Ambulatory Visit: Payer: Self-pay

## 2019-12-05 VITALS — BP 148/82 | HR 81 | Temp 98.0°F | Ht 70.0 in | Wt 181.2 lb

## 2019-12-05 DIAGNOSIS — K5909 Other constipation: Secondary | ICD-10-CM

## 2019-12-05 DIAGNOSIS — K5900 Constipation, unspecified: Secondary | ICD-10-CM | POA: Diagnosis not present

## 2019-12-05 DIAGNOSIS — R194 Change in bowel habit: Secondary | ICD-10-CM | POA: Diagnosis not present

## 2019-12-05 NOTE — Progress Notes (Signed)
Elizabeth Wilkins is a 74 y.o. female with the following history as recorded in EpicCare:  Patient Active Problem List   Diagnosis Date Noted  . Uncontrolled hypertension 05/08/2019  . Bladder spasms 11/27/2017  . Depression 11/27/2017  . Anxiety 11/22/2017  . Closed displaced fracture of right femoral neck ([place]) 08/22/2017  . 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 Herpes simplex 03/30/2017  . Vitamin D deficiency 03/30/2017  . Decreased hearing 03/30/2017  . Hyperglycemia 11/26/2015  . Seborrheic dermatitis 05/14/2012  . Osteopenia 11/18/2010  . Dyslipidemia 11/09/2010    Current Outpatient Medications  Medication Sig Dispense Refill  . ALPRAZolam (XANAX) 0.25 MG tablet Take one prior to dental work, you can repeat x 1 if needed 5 tablet 0  . chlorhexidine (PERIDEX) 0.12 % solution AFTER BRUSHING, RINSE WITH ONE CAPFUL FOR ONE MINUTE TWICE A DAY THEN SPIT OUT  0  . gabapentin (NEURONTIN) 100 MG capsule Take 2 capsules (200 mg total) by mouth at bedtime 60 capsule 1  . hydrOXYzine (ATARAX/VISTARIL) 10 MG tablet Take 10 mg by mouth 3 (three) times daily.    Marland Kitchen ibuprofen (ADVIL) 600 MG tablet ibuprofen 600 mg tablet    . olmesartan (BENICAR) 20 MG tablet TAKE 1 TABLET BY MOUTH EVERY DAY 90 tablet 1  . PARoxetine (PAXIL) 20 MG tablet Take 1 tablet (20 mg total) by mouth daily. (Patient taking differently: Take 10 mg by mouth daily. 1/2 tablet daily) 90 tablet 1   No current facility-administered medications for this visit.    Allergies: Clonazepam and Morphine  Past Medical History:  Diagnosis Date  . Hyperlipidemia   . Hypertension   . Osteopenia     Past Surgical History:  Procedure Laterality Date  . broken wrist Right    plates and screws  . COLONOSCOPY    . TOTAL HIP ARTHROPLASTY Right 08/22/2017   Procedure: TOTAL HIP ARTHROPLASTY ANTERIOR APPROACH AND ABDUCTOR TENDON REPAIR;  Surgeon: Rod Can, MD;  Location: [facility];  Service: Orthopedics;  Laterality:  Right;  . TUBAL LIGATION      Family History  Problem Relation Age of Onset  . Pancreatic cancer Mother 57  . Hypertension Mother   . Hyperlipidemia Mother   . Rectal cancer Brother 50  . Lupus Father 54  . Colon cancer Neg Hx   . Stomach cancer Neg Hx     Social History   Tobacco Use  . Smoking status: Never Smoker  . Smokeless tobacco: Never Used  Substance Use Topics  . Alcohol use: Yes    Alcohol/week: 21.0 standard drinks    Types: 14 Glasses of wine, 7 Shots of liquor per week    Comment: 2-3 drinks daily    Subjective:  Patient presents with concerns for chronic problems with constipation; symptoms have been present for 6 months- "progressively worsening" in the past 4 weeks;  FH: brother history of colon cancer in his 31s; patient was due for colonoscopy in 2019; admits she is not drinking enough water; Does occasionally see bright red blood; does have both ovaries;       Objective:  Vitals:   12/05/19 1132  BP: (!) 148/82  Pulse: 81  Temp: 98 F (36.7 C)  TempSrc: Oral  SpO2: 99%  Weight: 181 lb 3.2 oz (82.2 kg)  Height: 5\' 10"  (1.778 m)    General: Well developed, well nourished, in no acute distress  Skin : Warm and dry.  Head: Normocephalic and atraumatic  Lungs: Respirations unlabored;  clear to auscultation bilaterally without wheeze, rales, rhonchi  CVS exam: normal rate and regular rhythm.  Abdomen: Soft; nontender; nondistended; normoactive bowel sounds; no masses or hepatosplenomegaly  Neurologic: Alert and oriented; speech intact; face symmetrical; moves all extremities well; CNII-XII intact without focal deficit   Assessment:  1. Chronic constipation   2. Change in bowel habits     Plan:  Update X-ray today to make sure no acute obstruction; plan for abd/ pelvic CT and refer to GI for consult/ colonoscopy; Samples of Linzess 72 mg given to try taking daily;  This visit occurred during the SARS-CoV-2 public health emergency.  Safety  protocols were in place, including screening questions prior to the visit, additional usage of staff PPE, and extensive cleaning of exam room while observing appropriate contact time as indicated for disinfecting solutions.     No follow-ups on file.  Orders Placed This Encounter  Procedures  . DG Abd 2 Views    Standing Status:   Future    Number of Occurrences:   1    Standing Expiration Date:   02/03/2021    Order Specific Question:   Reason for Exam (SYMPTOM  OR DIAGNOSIS REQUIRED)    Answer:   constipation; abdominal pain    Order Specific Question:   Preferred imaging location?    Answer:   [facility]    Order Specific Question:   Radiology Contrast Protocol - do NOT remove file path    Answer:   [email or web address]  . Ambulatory referral to Gastroenterology    Referral Priority:   Routine    Referral Type:   Consultation    Referral Reason:   Specialty Services Required    Number of Visits Requested:   1    Requested Prescriptions    No prescriptions requested or ordered in this encounter

## 2019-12-06 ENCOUNTER — Telehealth: Payer: Self-pay | Admitting: Internal Medicine

## 2019-12-06 NOTE — Telephone Encounter (Signed)
Xray findings are consistent with constipation.  Has she started the linzess?  That may help.  As long as she is not having any pain nothing further needs to be done at this point

## 2019-12-06 NOTE — Telephone Encounter (Signed)
F/u   The  patient calling back to discuss test results & next steps

## 2019-12-06 NOTE — Telephone Encounter (Signed)
Saw Mickel Baas yesterday. Please advise on results.

## 2019-12-06 NOTE — Telephone Encounter (Signed)
Spoke with patient and info given 

## 2019-12-06 NOTE — Telephone Encounter (Signed)
New Message:   Pt is calling to get the results from he x-ray she got done on yesterday. She states she has received the results and it's stating she needs treatment. She saw Valere Dross due to her PCP not having any availability yesterday. Please advise.

## 2019-12-06 NOTE — Telephone Encounter (Signed)
Patient has been advised that Elizabeth Wilkins is out of the office today and I have sent message to Dr. Quay Burow to advise. She has been informed I will call her back as soon as Dr. Quay Burow responds.

## 2019-12-08 ENCOUNTER — Encounter: Payer: Self-pay | Admitting: Internal Medicine

## 2019-12-10 ENCOUNTER — Encounter: Payer: Self-pay | Admitting: Family

## 2019-12-10 NOTE — Progress Notes (Signed)
Subjective:    Patient ID: Elizabeth Wilkins, female    DOB: Sep 26, 1946, 74 y.o.   MRN: [id number]  HPI The patient is here for follow up of their chronic medical problems, including hypertension, anxiety, depression, hyperglycemia, hyperlipidemia, constipation.  She is taking all of her medications as prescribed.   She is exercising regularly - stationary bike, walking, but states she could be more active and is not exercising as much as she used to..      Constipation:  It has been going on for about a month or so. She tried prunes, prune juice, grapes.   She has belching on occasion and abdominal pain with diarrhea.  She had diarrhea only with taking medications for the constipation.  She is taking linzess ( samples) 72 mcg daily.  She has coffee in the morning.  She eats around noon.  She eats a lot of fruit and veges.  She denies any obvious causes for the change.  She is due for a colonoscopy and has that scheduled.  She has taken Senokot on occasion, but has not taken anything regularly.   Medications and allergies reviewed with patient and updated if appropriate.  Patient Active Problem List   Diagnosis Date Noted  . Constipation 12/11/2019  . Hypertension 05/08/2019  . Bladder spasms 11/27/2017  . Depression 11/27/2017  . Anxiety 11/22/2017  . Closed displaced fracture of right femoral neck ([place]) 08/22/2017  . 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 Herpes simplex 03/30/2017  . Vitamin D deficiency 03/30/2017  . Decreased hearing 03/30/2017  . Hyperglycemia 11/26/2015  . Seborrheic dermatitis 05/14/2012  . Osteopenia 11/18/2010  . Dyslipidemia 11/09/2010    Current Outpatient Medications on File Prior to Visit  Medication Sig Dispense Refill  . olmesartan (BENICAR) 20 MG tablet TAKE 1 TABLET BY MOUTH EVERY DAY 90 tablet 1  . PARoxetine (PAXIL) 20 MG tablet Take 1 tablet (20 mg total) by mouth daily. (Patient taking differently: Take 10 mg by mouth daily. 1/2  tablet daily) 90 tablet 1   No current facility-administered medications on file prior to visit.    Past Medical History:  Diagnosis Date  . Hyperlipidemia   . Hypertension   . Osteopenia     Past Surgical History:  Procedure Laterality Date  . broken wrist Right    plates and screws  . COLONOSCOPY    . 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 TUBAL LIGATION      Social History   Socioeconomic History  . Marital status: Married    Spouse name: Not on file  . Number of children: 3  . Years of education: Not on file  . Highest education level: Not on file  Occupational History  . Occupation: retired  Tobacco Use  . Smoking status: Never Smoker  . Smokeless tobacco: Never Used  Substance and Sexual Activity  . Alcohol use: Yes    Alcohol/week: 21.0 standard drinks    Types: 14 Glasses of wine, 7 Shots of liquor per week    Comment: 2-3 drinks daily  . Drug use: No  . Sexual activity: Not on file  Other Topics Concern  . 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 Pt is married  with 3 grown children and lives with spouse.      No regular exercise   Social Determinants of Health   Financial Resource Strain:   . Difficulty of Paying Living Expenses:   Food Insecurity:   . Worried About Charity fundraiser in the Last Year:   . Arboriculturist in the Last Year:   Transportation Needs:   . Film/video editor (Medical):   Marland Kitchen Lack of Transportation (Non-Medical):   Physical Activity:   . Days of Exercise per Week:   . Minutes of Exercise per Session:   Stress:   . Feeling of Stress :   Social Connections:   . Frequency of Communication with Friends and Family:   . Frequency of Social Gatherings  with Friends and Family:   . Attends Religious Services:   . Active Member of Clubs or Organizations:   . Attends Archivist Meetings:   Marland Kitchen Marital Status:     Family History  Problem Relation Age of Onset  . Pancreatic cancer Mother 56  . Hypertension Mother   . Hyperlipidemia Mother   . Rectal cancer Brother 38  . Lupus Father 46  . Colon cancer Neg Hx   . Stomach cancer Neg Hx     Review of Systems  Constitutional: Negative for chills and fever.  Respiratory: Negative for cough, shortness of breath and wheezing.   Cardiovascular: Negative for chest pain, palpitations and leg swelling.  Gastrointestinal: Positive for abdominal pain (with diarrhea), anal bleeding (with constipation) and constipation. Negative for blood in stool.  Neurological: Negative for light-headedness, numbness and headaches.       Objective:   Vitals:   12/11/19 0825  BP: (!) 144/80  Pulse: 63  Resp: 16  Temp: 97.8 F (36.6 C)  SpO2: 99%   BP Readings from Last 3 Encounters:  12/11/19 (!) 144/80  12/05/19 (!) 148/82  06/14/19 (!) 162/94   Wt Readings from Last 3 Encounters:  12/11/19 182 lb (82.6 kg)  12/05/19 181 lb 3.2 oz (82.2 kg)  06/14/19 178 lb (80.7 kg)   Body mass index is 26.11 kg/m.   Physical Exam    Constitutional: Appears well-developed and well-nourished. No distress.  HENT:  Head: Normocephalic and atraumatic.  Neck: Neck supple. No tracheal deviation present. No thyromegaly present.  No cervical lymphadenopathy Cardiovascular: Normal rate, regular rhythm and normal heart sounds.   No murmur heard. No carotid bruit .  No edema Pulmonary/Chest: Effort normal and breath sounds normal. No respiratory distress. No has no wheezes. No rales.  Skin: Skin is warm and dry. Not diaphoretic.  Psychiatric: Normal mood and affect. Behavior is normal.      Assessment & Plan:    See Problem List for Assessment and Plan of chronic medical problems.    This visit  occurred during the SARS-CoV-2 public health emergency.  Safety protocols were in place, including screening questions prior to the visit, additional usage of staff PPE, and extensive cleaning of exam room while observing appropriate contact time as indicated for disinfecting solutions.

## 2019-12-10 NOTE — Patient Instructions (Addendum)
For your constipation - try metamucil, stool softener, probiotics or miralax daily.     Blood work was ordered.     Medications reviewed and updated.  Changes include :   none     Please followup in 6 months

## 2019-12-11 ENCOUNTER — Ambulatory Visit (INDEPENDENT_AMBULATORY_CARE_PROVIDER_SITE_OTHER): Payer: Medicare Other | Admitting: Internal Medicine

## 2019-12-11 ENCOUNTER — Encounter: Payer: Self-pay | Admitting: Internal Medicine

## 2019-12-11 ENCOUNTER — Other Ambulatory Visit: Payer: Self-pay

## 2019-12-11 VITALS — BP 144/80 | HR 63 | Temp 97.8°F | Resp 16 | Ht 70.0 in | Wt 182.0 lb

## 2019-12-11 DIAGNOSIS — K5900 Constipation, unspecified: Secondary | ICD-10-CM

## 2019-12-11 DIAGNOSIS — E559 Vitamin D deficiency, unspecified: Secondary | ICD-10-CM | POA: Diagnosis not present

## 2019-12-11 DIAGNOSIS — R739 Hyperglycemia, unspecified: Secondary | ICD-10-CM | POA: Diagnosis not present

## 2019-12-11 DIAGNOSIS — E2839 Other primary ovarian failure: Secondary | ICD-10-CM

## 2019-12-11 DIAGNOSIS — F419 Anxiety disorder, unspecified: Secondary | ICD-10-CM

## 2019-12-11 DIAGNOSIS — E785 Hyperlipidemia, unspecified: Secondary | ICD-10-CM | POA: Diagnosis not present

## 2019-12-11 DIAGNOSIS — I10 Essential (primary) hypertension: Secondary | ICD-10-CM | POA: Diagnosis not present

## 2019-12-11 DIAGNOSIS — M85859 Other specified disorders of bone density and structure, unspecified thigh: Secondary | ICD-10-CM

## 2019-12-11 DIAGNOSIS — F3289 Other specified depressive episodes: Secondary | ICD-10-CM | POA: Diagnosis not present

## 2019-12-11 LAB — LIPID PANEL
Cholesterol: 197 mg/dL (ref 0–200)
HDL: 53.4 mg/dL (ref >39.00)
LDL Cholesterol: 126 mg/dL — ABNORMAL HIGH (ref 0–99)
NonHDL: 143.87
Total CHOL/HDL Ratio: 4
Triglycerides: 88 mg/dL (ref 0.0–149.0)
VLDL: 17.6 mg/dL (ref 0.0–40.0)

## 2019-12-11 LAB — COMPREHENSIVE METABOLIC PANEL
ALT: 12 U/L (ref 0–35)
AST: 15 U/L (ref 0–37)
Albumin: 4.2 g/dL (ref 3.5–5.2)
Alkaline Phosphatase: 90 U/L (ref 39–117)
BUN: 24 mg/dL — ABNORMAL HIGH (ref 6–23)
CO2: 27 mEq/L (ref 19–32)
Calcium: 9.5 mg/dL (ref 8.4–10.5)
Chloride: 106 mEq/L (ref 96–112)
Creatinine, Ser: 0.88 mg/dL (ref 0.40–1.20)
GFR: 62.7 mL/min (ref >60.00)
Glucose, Bld: 85 mg/dL (ref 70–99)
Potassium: 4.8 mEq/L (ref 3.5–5.1)
Sodium: 139 mEq/L (ref 135–145)
Total Bilirubin: 0.5 mg/dL (ref 0.2–1.2)
Total Protein: 6.9 g/dL (ref 6.0–8.3)

## 2019-12-11 LAB — HEMOGLOBIN A1C: Hgb A1c MFr Bld: 5 % (ref 4.6–6.5)

## 2019-12-11 LAB — VITAMIN D 25 HYDROXY (VIT D DEFICIENCY, FRACTURES): VITD: 35.05 ng/mL (ref 30.00–100.00)

## 2019-12-11 LAB — TSH: TSH: 3.19 u[IU]/mL (ref 0.35–4.50)

## 2019-12-11 NOTE — Assessment & Plan Note (Signed)
Chronic Controlled, stable Continue current dose of medication paxil 10 mg daily 

## 2019-12-11 NOTE — Assessment & Plan Note (Signed)
Not taking vitamin d Check level

## 2019-12-11 NOTE — Assessment & Plan Note (Signed)
Subacute Has colonoscopy scheduled We will check TSH, but this was normal in the fall and unlikely to cause Discussed several things that she could try including Metamucil, stool softener on a daily basis, MiraLAX Discussed that we can try increasing the Linzess to see if that helps Increase water intake Continue regular exercise Sees GI later this month for precolonoscopy visit

## 2019-12-11 NOTE — Assessment & Plan Note (Signed)
Chronic A1c has been normal in the past Will recheck today Continue regular exercise

## 2019-12-11 NOTE — Assessment & Plan Note (Signed)
Chronic BP controlled Current regimen effective and well tolerated Continue current medications at current doses cmp  

## 2019-12-11 NOTE — Assessment & Plan Note (Signed)
Chronic Diet controlled Check lipid panel Continue regular exercise

## 2019-12-12 ENCOUNTER — Encounter: Payer: Self-pay | Admitting: Internal Medicine

## 2019-12-13 ENCOUNTER — Ambulatory Visit: Payer: Medicare Other | Admitting: Internal Medicine

## 2019-12-13 ENCOUNTER — Encounter: Payer: Self-pay | Admitting: Internal Medicine

## 2019-12-17 ENCOUNTER — Other Ambulatory Visit: Payer: Self-pay

## 2019-12-17 ENCOUNTER — Ambulatory Visit (AMBULATORY_SURGERY_CENTER): Payer: Self-pay | Admitting: *Deleted

## 2019-12-17 VITALS — Temp 96.9°F | Ht 70.0 in | Wt 183.0 lb

## 2019-12-17 DIAGNOSIS — Z800 Family history of malignant neoplasm of digestive organs: Secondary | ICD-10-CM

## 2019-12-17 MED ORDER — NA SULFATE-K SULFATE-MG SULF 17.5-3.13-1.6 GM/177ML PO SOLN: ORAL | 0 refills | Status: DC

## 2019-12-17 NOTE — Progress Notes (Signed)
Patient is here in-person for PV. Patient denies any allergies to eggs or soy. Patient denies any problems with anesthesia/sedation. Patient denies any oxygen use at home. Patient denies taking any diet/weight loss medications or blood thinners. Patient is not being treated for MRSA or C-diff.  COVID-19 screening test not scheduled pt has had both vaccines..  Patient is aware of our care-partner policy and 0000000 safety protocol.    2day SUprep/Miralax prep given to patient due to constipation and OV made with PA per pt's request.

## 2019-12-20 DIAGNOSIS — S76012A Strain of muscle, fascia and tendon of left hip, initial encounter: Secondary | ICD-10-CM | POA: Diagnosis not present

## 2019-12-20 DIAGNOSIS — M25552 Pain in left hip: Secondary | ICD-10-CM | POA: Diagnosis not present

## 2019-12-23 ENCOUNTER — Ambulatory Visit (INDEPENDENT_AMBULATORY_CARE_PROVIDER_SITE_OTHER): Payer: Medicare Other | Admitting: Physician Assistant

## 2019-12-23 ENCOUNTER — Encounter: Payer: Self-pay | Admitting: Physician Assistant

## 2019-12-23 VITALS — BP 138/78 | HR 72 | Temp 98.2°F | Ht 68.25 in | Wt 188.1 lb

## 2019-12-23 DIAGNOSIS — Z780 Asymptomatic menopausal state: Secondary | ICD-10-CM | POA: Diagnosis not present

## 2019-12-23 DIAGNOSIS — M810 Age-related osteoporosis without current pathological fracture: Secondary | ICD-10-CM | POA: Diagnosis not present

## 2019-12-23 DIAGNOSIS — Z800 Family history of malignant neoplasm of digestive organs: Secondary | ICD-10-CM | POA: Diagnosis not present

## 2019-12-23 DIAGNOSIS — K5909 Other constipation: Secondary | ICD-10-CM

## 2019-12-23 DIAGNOSIS — M8589 Other specified disorders of bone density and structure, multiple sites: Secondary | ICD-10-CM | POA: Diagnosis not present

## 2019-12-23 LAB — HM DEXA SCAN

## 2019-12-23 MED ORDER — LINACLOTIDE 72 MCG PO CAPS: 72.0000 ug | ORAL_CAPSULE | Freq: Every day | ORAL | 11 refills | Status: DC

## 2019-12-23 NOTE — Patient Instructions (Signed)
If you are age 75 or older, your body mass index should be between 23-30. Your Body mass index is 28.4 kg/m. If this is out of the aforementioned range listed, please consider follow up with your Primary Care Provider.  If you are age 37 or younger, your body mass index should be between 19-25. Your Body mass index is 28.4 kg/m. If this is out of the aformentioned range listed, please consider follow up with your Primary Care Provider.   Linzess 72 mcg 1 capsule daily. This has been sent to your pharmacy.

## 2019-12-23 NOTE — Progress Notes (Signed)
Subjective:    Patient ID: Elizabeth Wilkins, female    DOB: [birth year], 74 y.o.   MRN: [id number]  HPI Elizabeth Wilkins is a pleasant 74 year old white female, established with Dr. Carlean Purl previously, now establishing with Dr. Silverio Decamp.  She comes in today with complaints of constipation. She had undergone colonoscopy in November 2014 with finding of mild sigmoid diverticulosis and otherwise negative exam. She is already scheduled for recall colonoscopy with Dr. Silverio Decamp next week on 12/31/2019, due to family history of colon cancer in her brother. Patient says she developed new constipation within the past couple of months.  This had never been an issue in the past.  She started drinking prune juice, eating prunes, and taking over-the-counter laxatives.  She was seen by primary care a few weeks ago and given samples of Linzess 72 mcg daily.  She says this worked very well and would like to continue on that if possible.  She had also tried MiraLAX but says the Linzess was more helpful. She says her bowels used to be very regular and over these past couple of months she was going multiple days between bowel movements.  She has no associated complaints of abdominal pain, and no melena or hematochezia.  She has not been on any new medications or supplements.  She says she has been significantly less active this winter secondary to Covid. Recent TSH normal at 3.1.  Review of Systems Pertinent positive and negative review of systems were noted in the above HPI section.  All other review of systems was otherwise negative.  Outpatient Encounter Medications as of 12/23/2019  Medication Sig  . clobetasol (TEMOVATE) 0.05 % external solution clobetasol 0.05 % scalp solution  APPLY TWICE A DAY TO SCALP AS NEEDED  . Clobetasol Propionate 0.05 % shampoo clobetasol 0.05 % shampoo  APPLY TO SCALP, LATHER, LET SIT 5 10 MIN AND RINSE 1 2 TIMES A WEEK AS NEEDED  . olmesartan (BENICAR) 20 MG tablet TAKE 1 TABLET BY MOUTH EVERY DAY   . PARoxetine (PAXIL) 20 MG tablet Take 1 tablet (20 mg total) by mouth daily. (Patient taking differently: Take 10 mg by mouth daily. 1/2 tablet daily)  . linaclotide (LINZESS) 72 MCG capsule Take 1 capsule (72 mcg total) by mouth daily before breakfast.  . [DISCONTINUED] Na Sulfate-K Sulfate-Mg Sulf 17.5-3.13-1.6 GM/177ML SOLN Suprep (no substitutions)-TAKE AS DIRECTED.   No facility-administered encounter medications on file as of 12/23/2019.   Allergies  Allergen Reactions  . Clonazepam     Slurred words, drowsy  . Morphine Itching   Patient Active Problem List   Diagnosis Date Noted  . Constipation 12/11/2019  . Hypertension 05/08/2019  . Bladder spasms 11/27/2017  . Depression 11/27/2017  . Anxiety 11/22/2017  . Closed displaced fracture of right femoral neck ([place]) 08/22/2017  . 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 Herpes simplex 03/30/2017  . Vitamin D deficiency 03/30/2017  . Decreased hearing 03/30/2017  . Hyperglycemia 11/26/2015  . Seborrheic dermatitis 05/14/2012  . Osteopenia 11/18/2010  . Dyslipidemia 11/09/2010   Social History   Socioeconomic History  . Marital status: Married    Spouse name: Not on file  . Number of children: 3  . Years of education: Not on file  . Highest education level: Not on file  Occupational History  . Occupation: retired  Tobacco Use  . Smoking status: Never Smoker  . Smokeless tobacco: Never Used  Substance and Sexual Activity  . Alcohol use: Yes    Alcohol/week:  14.0 standard drinks    Types: 7 Shots of liquor, 7 Glasses of wine per week  . Drug use: No  . Sexual activity: Not on file  Other Topics Concern  . 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      No regular exercise   Social Determinants of Health   Financial Resource Strain:   . Difficulty of  Paying Living Expenses:   Food Insecurity:   . Worried About Charity fundraiser in the Last Year:   . Arboriculturist in the Last Year:   Transportation Needs:   . Film/video editor (Medical):   Marland Kitchen Lack of Transportation (Non-Medical):   Physical Activity:   . Days of Exercise per Week:   . Minutes of Exercise per Session:   Stress:   . Feeling of Stress :   Social Connections:   . Frequency of Communication with Friends and Family:   . Frequency of Social Gatherings with Friends and Family:   . Attends Religious Services:   . Active Member of Clubs or Organizations:   . Attends Archivist Meetings:   Marland Kitchen Marital Status:   Intimate Partner Violence:   . Fear of Current or Ex-Partner:   . Emotionally Abused:   Marland Kitchen Physically Abused:   . Sexually Abused:     Ms. Chappelle's family history includes Colon cancer (age of onset: 42) in her brother; Hyperlipidemia in her mother; Hypertension in her mother; Lupus (age of onset: 36) in her father; Pancreatic cancer (age of onset: 38) in her mother; Rectal cancer (age of onset: 40) in her brother.      Objective:    Vitals:   12/23/19 0943  BP: 138/78  Pulse: 72  Temp: 98.2 F (36.8 C)    Physical Exam Well-developed well-nourished older white female in no acute distress.  Height, Weight 188, BMI 28.4 HEENT; nontraumatic normocephalic, EOMI, PER R LA, sclera anicteric. Oropharynx; not examined Neck; supple, no JVD Cardiovascular; regular rate and rhythm with S1-S2, no murmur rub or gallop Pulmonary; Clear bilaterally Abdomen; soft, nontender, nondistended, no palpable mass or hepatosplenomegaly, bowel sounds are active Rectal; not done today Skin; benign exam, no jaundice rash or appreciable lesions Extremities; no clubbing cyanosis or edema skin warm and dry Neuro/Psych; alert and oriented x4, grossly nonfocal mood and affect appropriate       Assessment & Plan:   #68 75 year old white female with new onset of  constipation x2 months.  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 Good response to recent trial of Linzess.  #2 family history of colon cancer in patient's brother-patient is already scheduled for follow-up colonoscopy next week.  #3 diverticulosis #4 hypertension #5.  Osteopenia  Plan; We discussed liberal water intake, at least 60 ounces per day, moderate exercise. High-fiber diet. Patient was reinstructed regarding colon prep as she had misplaced her papers. Patient was given samples of Linzess 72 mcg, and prescription sent for daily use. Further plans pending findings at upcoming colonoscopy.  Jeronimo Hellberg S Dinesh Ulysse PA-C 12/23/2019   Cc: Binnie Rail, MD

## 2019-12-24 DIAGNOSIS — M25552 Pain in left hip: Secondary | ICD-10-CM | POA: Diagnosis not present

## 2019-12-24 DIAGNOSIS — M7062 Trochanteric bursitis, left hip: Secondary | ICD-10-CM | POA: Diagnosis not present

## 2019-12-30 ENCOUNTER — Encounter: Payer: Self-pay | Admitting: Internal Medicine

## 2019-12-30 NOTE — Progress Notes (Signed)
Reviewed and agree with documentation and assessment and plan. K. Veena Gwynevere Lizana , MD   

## 2019-12-31 ENCOUNTER — Other Ambulatory Visit: Payer: Self-pay

## 2019-12-31 ENCOUNTER — Encounter: Payer: Self-pay | Admitting: Gastroenterology

## 2019-12-31 ENCOUNTER — Ambulatory Visit (AMBULATORY_SURGERY_CENTER): Payer: Medicare Other | Admitting: Gastroenterology

## 2019-12-31 VITALS — BP 170/81 | HR 58 | Temp 97.1°F | Resp 18 | Ht 70.0 in | Wt 183.0 lb

## 2019-12-31 DIAGNOSIS — D123 Benign neoplasm of transverse colon: Secondary | ICD-10-CM

## 2019-12-31 DIAGNOSIS — D124 Benign neoplasm of descending colon: Secondary | ICD-10-CM

## 2019-12-31 DIAGNOSIS — Z800 Family history of malignant neoplasm of digestive organs: Secondary | ICD-10-CM | POA: Diagnosis not present

## 2019-12-31 DIAGNOSIS — K621 Rectal polyp: Secondary | ICD-10-CM

## 2019-12-31 DIAGNOSIS — K635 Polyp of colon: Secondary | ICD-10-CM | POA: Diagnosis not present

## 2019-12-31 DIAGNOSIS — Z1211 Encounter for screening for malignant neoplasm of colon: Secondary | ICD-10-CM

## 2019-12-31 DIAGNOSIS — D125 Benign neoplasm of sigmoid colon: Secondary | ICD-10-CM

## 2019-12-31 MED ORDER — SODIUM CHLORIDE 0.9 % IV SOLN: 500.0000 mL | Freq: Once | INTRAVENOUS | Status: DC

## 2019-12-31 NOTE — Progress Notes (Signed)
Temp JB VS DT  Pt's states no medical or surgical changes since previsit or office visit. 

## 2019-12-31 NOTE — Progress Notes (Signed)
Called to room to assist during endoscopic procedure.  Patient ID and intended procedure confirmed with present staff. Received instructions for my participation in the procedure from the performing physician.  

## 2019-12-31 NOTE — Patient Instructions (Signed)
Handouts Provided:  Polyps  YOU HAD AN ENDOSCOPIC PROCEDURE TODAY AT THE [facility]:   Refer to the procedure report that was given to you for any specific questions about what was found during the examination.  If the procedure report does not answer your questions, please call your gastroenterologist to clarify.  If you requested that your care partner not be given the details of your procedure findings, then the procedure report has been included in a sealed envelope for you to review at your convenience later.  YOU SHOULD EXPECT: Some feelings of bloating in the abdomen. Passage of more gas than usual.  Walking can help get rid of the air that was put into your GI tract during the procedure and reduce the bloating. If you had a lower endoscopy (such as a colonoscopy or flexible sigmoidoscopy) you may notice spotting of blood in your stool or on the toilet paper. If you underwent a bowel prep for your procedure, you may not have a normal bowel movement for a few days.  Please Note:  You might notice some irritation and congestion in your nose or some drainage.  This is from the oxygen used during your procedure.  There is no need for concern and it should clear up in a day or so.  SYMPTOMS TO REPORT IMMEDIATELY:   Following lower endoscopy (colonoscopy or flexible sigmoidoscopy):  Excessive amounts of blood in the stool  Significant tenderness or worsening of abdominal pains  Swelling of the abdomen that is new, acute  Fever of 100F or higher  For urgent or emergent issues, a gastroenterologist can be reached at any hour by calling [phone]. Do not use MyChart messaging for urgent concerns.    DIET:  We do recommend a small meal at first, but then you may proceed to your regular diet.  Drink plenty of fluids but you should avoid alcoholic beverages for 24 hours.  ACTIVITY:  You should plan to take it easy for the rest of today and you should NOT DRIVE or use heavy  machinery until tomorrow (because of the sedation medicines used during the test).    FOLLOW UP: Our staff will call the number listed on your records 48-72 hours following your procedure to check on you and address any questions or concerns that you may have regarding the information given to you following your procedure. If we do not reach you, we will leave a message.  We will attempt to reach you two times.  During this call, we will ask if you have developed any symptoms of COVID 19. If you develop any symptoms (ie: fever, flu-like symptoms, shortness of breath, cough etc.) before then, please call [phone].  If you test positive for Covid 19 in the 2 weeks post procedure, please call and report this information to us.    If any biopsies were taken you will be contacted by phone or by letter within the next 1-3 weeks.  Please call us at [phone] if you have not heard about the biopsies in 3 weeks.    SIGNATURES/CONFIDENTIALITY: You and/or your care partner have signed paperwork which will be entered into your electronic medical record.  These signatures attest to the fact that that the information above on your After Visit Summary has been reviewed and is understood.  Full responsibility of the confidentiality of this discharge information lies with you and/or your care-partner.  

## 2019-12-31 NOTE — Op Note (Signed)
[facility] Patient Name: Elizabeth Wilkins Procedure Date: 12/31/2019 11:04 AM MRN: [id number] Endoscopist: Mauri Pole , MD Age: 74 Referring MD:  Date of Birth: [birth year] Gender: Female Account #: [id number] Procedure:                Colonoscopy Indications:              Screening in patient at increased risk: Colorectal                            cancer in brother 32 or older Medicines:                Monitored Anesthesia Care Procedure:                Pre-Anesthesia Assessment:                           - Prior to the procedure, a History and Physical                            was performed, and patient medications and                            allergies were reviewed. The patient's tolerance of                            previous anesthesia was also reviewed. The risks                            and benefits of the procedure and the sedation                            options and risks were discussed with the patient.                            All questions were answered, and informed consent                            was obtained. Prior Anticoagulants: The patient has                            taken no previous anticoagulant or antiplatelet                            agents. ASA Grade Assessment: II - A patient with                            mild systemic disease. After reviewing the risks                            and benefits, the patient was deemed in                            satisfactory condition to undergo the procedure.  After obtaining informed consent, the colonoscope                            was passed under direct vision. Throughout the                            procedure, the patient's blood pressure, pulse, and                            oxygen saturations were monitored continuously. The                            Colonoscope was introduced through the anus and                            advanced to the the cecum,  identified by                            appendiceal orifice and ileocecal valve. The                            colonoscopy was performed without difficulty. The                            patient tolerated the procedure well. The quality                            of the bowel preparation was good. The ileocecal                            valve, appendiceal orifice, and rectum were                            photographed. Scope In: 11:21:37 AM Scope Out: 11:38:01 AM Scope Withdrawal Time: 0 hours 9 minutes 45 seconds  Total Procedure Duration: 0 hours 16 minutes 24 seconds  Findings:                 The perianal and digital rectal examinations were                            normal.                           Three sessile polyps were found in the rectum,                            descending colon and transverse colon. The polyps                            were 1 to 2 mm in size. These polyps were removed                            with a cold biopsy forceps. Resection and retrieval  were complete.                           A few small and large-mouthed diverticula were                            found in the sigmoid colon.                           Non-bleeding internal hemorrhoids were found during                            retroflexion. The hemorrhoids were small.                           The exam was otherwise without abnormality. Complications:            No immediate complications. Estimated Blood Loss:     Estimated blood loss was minimal. Impression:               - Three 1 to 2 mm polyps in the rectum, in the                            descending colon and in the transverse colon,                            removed with a cold biopsy forceps. Resected and                            retrieved.                           - Diverticulosis in the sigmoid colon.                           - Non-bleeding internal hemorrhoids.                           -  The examination was otherwise normal. Recommendation:           - Patient has a contact number available for                            emergencies. The signs and symptoms of potential                            delayed complications were discussed with the                            patient. Return to normal activities tomorrow.                            Written discharge instructions were provided to the                            patient.                           -  Resume previous diet.                           - Continue present medications.                           - Await pathology results.                           - Repeat colonoscopy in 3 - 5 years for                            surveillance based on pathology results. Mauri Pole, MD 12/31/2019 11:43:54 AM This report has been signed electronically.

## 2019-12-31 NOTE — Progress Notes (Signed)
pt tolerated well. VSS. awake and to recovery. Report given to RN.  

## 2020-01-02 ENCOUNTER — Encounter: Payer: Self-pay | Admitting: Internal Medicine

## 2020-01-02 ENCOUNTER — Telehealth: Payer: Self-pay | Admitting: *Deleted

## 2020-01-02 NOTE — Telephone Encounter (Signed)
No answer for post procedure call back. Left message for patient to call with questions or concerns. 

## 2020-01-03 ENCOUNTER — Encounter: Payer: Self-pay | Admitting: Internal Medicine

## 2020-01-04 DIAGNOSIS — M7062 Trochanteric bursitis, left hip: Secondary | ICD-10-CM | POA: Diagnosis not present

## 2020-01-10 ENCOUNTER — Encounter: Payer: Self-pay | Admitting: Gastroenterology

## 2020-01-10 DIAGNOSIS — M7062 Trochanteric bursitis, left hip: Secondary | ICD-10-CM | POA: Diagnosis not present

## 2020-01-15 ENCOUNTER — Encounter: Payer: Self-pay | Admitting: Internal Medicine

## 2020-01-15 ENCOUNTER — Ambulatory Visit (INDEPENDENT_AMBULATORY_CARE_PROVIDER_SITE_OTHER): Payer: Medicare Other | Admitting: Internal Medicine

## 2020-01-15 ENCOUNTER — Other Ambulatory Visit: Payer: Self-pay

## 2020-01-15 VITALS — BP 134/82 | HR 60 | Temp 98.2°F | Resp 16 | Ht 70.0 in | Wt 184.0 lb

## 2020-01-15 DIAGNOSIS — M810 Age-related osteoporosis without current pathological fracture: Secondary | ICD-10-CM

## 2020-01-15 DIAGNOSIS — F3289 Other specified depressive episodes: Secondary | ICD-10-CM | POA: Diagnosis not present

## 2020-01-15 DIAGNOSIS — F419 Anxiety disorder, unspecified: Secondary | ICD-10-CM | POA: Diagnosis not present

## 2020-01-15 DIAGNOSIS — I10 Essential (primary) hypertension: Secondary | ICD-10-CM

## 2020-01-15 NOTE — Progress Notes (Signed)
Subjective:    Patient ID: Elizabeth Wilkins, female    DOB: [birth year], 74 y.o.   MRN: [id number]  HPI The patient is here for follow up of their osteoporosis.  Her last dexa showed: 12/23/19:  1/3 left distal radius -3.5,  LFN  -2.2,  Spine -1.6  Taking tums, centrum.  She tries to get out in the sun to get some vitamin D on a regular basis.  foasmax caused weakness years ago.   Colman Cater to have another implant and has had continuous dental work over the past couple of years.  She states her dental work will not be done anytime soon.  She is active-she does a lot of yard work.  She will be starting some physical therapy for left hip pain.  She did fracture her right hip after a significant fall.  She has fractured her right wrist and has a plate in place.  She feels her current medications are working well.  She takes the paroxetine daily and her anxiety and depression are controlled.  She is taking blood pressure medication daily and denies side effects.  Medications and allergies reviewed with patient and updated if appropriate.  Patient Active Problem List   Diagnosis Date Noted  . Constipation 12/11/2019  . Hypertension 05/08/2019  . Bladder spasms 11/27/2017  . Depression 11/27/2017  . Anxiety 11/22/2017  . Closed displaced fracture of right femoral neck ([place]) 08/22/2017  . 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 Herpes simplex 03/30/2017  . Vitamin D deficiency 03/30/2017  . Decreased hearing 03/30/2017  . Hyperglycemia 11/26/2015  . Seborrheic dermatitis 05/14/2012  . Osteoporosis 11/18/2010  . Dyslipidemia 11/09/2010    Current Outpatient Medications on File Prior to Visit  Medication Sig Dispense Refill  . acetaminophen (TYLENOL) 500 MG tablet Take 1,000 mg by mouth every 6 (six) hours as needed.    . chlorhexidine (PERIDEX) 0.12 % solution chlorhexidine gluconate 0.12 % mouthwash  RINSE MOUTH WITH 1 CAPFUL FOR 30 SEC AM AND PM AFTER TOOTHBRUSHING SPIT  AFTER RINSING DO NOT SWALLOW    . clobetasol (TEMOVATE) 0.05 % external solution clobetasol 0.05 % scalp solution  APPLY TWICE A DAY TO SCALP AS NEEDED    . Clobetasol Propionate 0.05 % shampoo clobetasol 0.05 % shampoo  APPLY TO SCALP, LATHER, LET SIT 5 10 MIN AND RINSE 1 2 TIMES A WEEK AS NEEDED    . linaclotide (LINZESS) 72 MCG capsule Take 1 capsule (72 mcg total) by mouth daily before breakfast. 30 capsule 11  . olmesartan (BENICAR) 20 MG tablet TAKE 1 TABLET BY MOUTH EVERY DAY 90 tablet 1  . PARoxetine (PAXIL) 20 MG tablet Take 1 tablet (20 mg total) by mouth daily. (Patient taking differently: Take 10 mg by mouth daily. 1/2 tablet daily) 90 tablet 1   No current facility-administered medications on file prior to visit.    Past Medical History:  Diagnosis Date  . Heart murmur   . Hyperlipidemia   . Hypertension   . Osteopenia     Past Surgical History:  Procedure Laterality Date  . broken wrist Right    plates and screws  . COLONOSCOPY  08/02/2013  . 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 TUBAL LIGATION      Social History   Socioeconomic History  . Marital status: Married  Spouse name: Not on file  . Number of children: 3  . Years of education: Not on file  . Highest education level: Not on file  Occupational History  . Occupation: retired  Tobacco Use  . Smoking status: Never Smoker  . Smokeless tobacco: Never Used  Substance and Sexual Activity  . Alcohol use: Yes    Alcohol/week: 14.0 standard drinks    Types: 7 Shots of liquor, 7 Glasses of wine per week  . Drug use: No  . Sexual activity: Not on file  Other Topics Concern  . 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 Pt is married with  3 grown children and lives with spouse.      No regular exercise   Social Determinants of Health   Financial Resource Strain:   . Difficulty of Paying Living Expenses:   Food Insecurity:   . Worried About Charity fundraiser in the Last Year:   . Arboriculturist in the Last Year:   Transportation Needs:   . Film/video editor (Medical):   Marland Kitchen Lack of Transportation (Non-Medical):   Physical Activity:   . Days of Exercise per Week:   . Minutes of Exercise per Session:   Stress:   . Feeling of Stress :   Social Connections:   . Frequency of Communication with Friends and Family:   . Frequency of Social Gatherings with Friends and Family:   . Attends Religious Services:   . Active Member of Clubs or Organizations:   . Attends Archivist Meetings:   Marland Kitchen Marital Status:     Family History  Problem Relation Age of Onset  . Pancreatic cancer Mother 34  . Hypertension Mother   . Hyperlipidemia Mother   . Rectal cancer Brother 78  . Colon cancer Brother 37  . Lupus Father 23  . Stomach cancer Neg Hx   . Colon polyps Neg Hx   . Esophageal cancer Neg Hx     Review of Systems  Constitutional: Negative for fever.  Respiratory: Negative for shortness of breath.   Cardiovascular: Negative for chest pain and leg swelling.  Neurological: Negative for light-headedness and headaches.       Objective:   Vitals:   01/15/20 1419  BP: 134/82  Pulse: 60  Resp: 16  Temp: 98.2 F (36.8 C)  SpO2: 98%   BP Readings from Last 3 Encounters:  01/15/20 134/82  12/31/19 (!) 170/81  12/23/19 138/78   Wt Readings from Last 3 Encounters:  01/15/20 184 lb (83.5 kg)  12/31/19 183 lb (83 kg)  12/23/19 188 lb 2 oz (85.3 kg)   Body mass index is 26.4 kg/m.   Physical Exam         Assessment & Plan:    See Problem List for Assessment and Plan of chronic medical problems.    This visit occurred during the SARS-CoV-2 public health emergency.  Safety protocols were  in place, including screening questions prior to the visit, additional usage of staff PPE, and extensive cleaning of exam room while observing appropriate contact time as indicated for disinfecting solutions.

## 2020-01-15 NOTE — Assessment & Plan Note (Signed)
Chronic Controlled, stable Continue current dose of Paxil-10 mg daily

## 2020-01-15 NOTE — Assessment & Plan Note (Signed)
Chronic BP well controlled Current regimen effective and well tolerated Continue current medications at current doses  

## 2020-01-15 NOTE — Assessment & Plan Note (Signed)
Acute-this is a new diagnosis Reviewed last DEXA Discussed the importance of calcium and vitamin D on a daily basis Discussed regular exercise, weightbearing exercise, weights for upper extremities Discussed fall prevention Discussed treatment options-she would not be a good candidate for bisphosphonates because of side effects with Fosamax Can consider Prolia Advised her to discuss with her dentist given all of her dental work Given that her more severe bone density is in her wrist the concern for treatment is slightly lower, which we discussed.  Discussed in detail the bone density in her hip and spine and what that means No medication at this time We will check DEXA every 2 years

## 2020-01-15 NOTE — Patient Instructions (Addendum)
Continue taking calcium and vitamin d daily.  Try to get 600 mg of calcium twice a day with food and 1000-2000 units of vitamin D daily.  Exercise regularly.  Do light weights for your arms.    Consider medication for your bones.  Discuss this with your oral surgeon.    We can consider prolia - an injection twice a year.  We can also refer you to an osteoporosis specialist.     Osteoporosis  Osteoporosis is thinning and loss of density in your bones. Osteoporosis makes bones more brittle and fragile and more likely to break (fracture). Over time, osteoporosis can cause your bones to become so weak that they fracture after a minor fall. Bones in the hip, wrist, and spine are most likely to fracture due to osteoporosis. What are the causes? The exact cause of this condition is not known. What increases the risk? You may be at greater risk for osteoporosis if you:  Have a family history of the condition.  Have poor nutrition.  Use steroid medicines, such as prednisone.  Are female.  Are age 44 or older.  Smoke or have a history of smoking.  Are not physically active (are sedentary).  Are white (Caucasian) or of Asian descent.  Have a small body frame.  Take certain medicines, such as antiseizure medicines. What are the signs or symptoms? A fracture might be the first sign of osteoporosis, especially if the fracture results from a fall or injury that usually would not cause a bone to break. Other signs and symptoms include:  Pain in the neck or low back.  Stooped posture.  Loss of height. How is this diagnosed? This condition may be diagnosed based on:  Your medical history.  A physical exam.  A bone mineral density test, also called a DXA or DEXA test (dual-energy X-ray absorptiometry test). This test uses X-rays to measure the amount of minerals in your bones. How is this treated? The goal of treatment is to strengthen your bones and lower your risk for a  fracture. Treatment may involve:  Making lifestyle changes, such as: ? Including foods with more calcium and vitamin D in your diet. ? Doing weight-bearing and muscle-strengthening exercises. ? Stopping tobacco use. ? Limiting alcohol intake.  Taking medicine to slow the process of bone loss or to increase bone density.  Taking daily supplements of calcium and vitamin D.  Taking hormone replacement medicines, such as estrogen for women and testosterone for men.  Monitoring your levels of calcium and vitamin D. Follow these instructions at home:  Activity  Exercise as told by your health care provider. Ask your health care provider what exercises and activities are safe for you. You should do: ? Exercises that make you work against gravity (weight-bearing exercises), such as tai chi, yoga, or walking. ? Exercises to strengthen muscles, such as lifting weights. Lifestyle  Limit alcohol intake to no more than 1 drink a day for nonpregnant women and 2 drinks a day for men. One drink equals 12 oz of beer, 5 oz of wine, or 1 oz of hard liquor.  Do not use any products that contain nicotine or tobacco, such as cigarettes and e-cigarettes. If you need help quitting, ask your health care provider. Preventing falls  Use devices to help you move around (mobility aids) as needed, such as canes, walkers, scooters, or crutches.  Keep rooms well-lit and clutter-free.  Remove tripping hazards from walkways, including cords and throw rugs.  Install grab  bars in bathrooms and safety rails on stairs.  Use rubber mats in the bathroom and other areas that are often wet or slippery.  Wear closed-toe shoes that fit well and support your feet. Wear shoes that have rubber soles or low heels.  Review your medicines with your health care provider. Some medicines can cause dizziness or changes in blood pressure, which can increase your risk of falling. General instructions  Include calcium and  vitamin D in your diet. Calcium is important for bone health, and vitamin D helps your body to absorb calcium. Good sources of calcium and vitamin D include: ? Certain fatty fish, such as salmon and tuna. ? Products that have calcium and vitamin D added to them (fortified products), such as fortified cereals. ? Egg yolks. ? Cheese. ? Liver.  Take over-the-counter and prescription medicines only as told by your health care provider.  Keep all follow-up visits as told by your health care provider. This is important. Contact a health care provider if:  You have never been screened for osteoporosis and you are: ? A woman who is age 74 or older. ? A man who is age 79 or older. Get help right away if:  You fall or injure yourself. Summary  Osteoporosis is thinning and loss of density in your bones. This makes bones more brittle and fragile and more likely to break (fracture),even with minor falls.  The goal of treatment is to strengthen your bones and reduce your risk for a fracture.  Include calcium and vitamin D in your diet. Calcium is important for bone health, and vitamin D helps your body to absorb calcium.  Talk with your health care provider about screening for osteoporosis if you are a woman who is age 40 or older, or a man who is age 78 or older. This information is not intended to replace advice given to you by your health care provider. Make sure you discuss any questions you have with your health care provider. Document Revised: 08/25/2017 Document Reviewed: 07/07/2017 Elsevier Patient Education  2020 [employer].

## 2020-02-14 DIAGNOSIS — M25551 Pain in right hip: Secondary | ICD-10-CM | POA: Diagnosis not present

## 2020-02-14 DIAGNOSIS — M25552 Pain in left hip: Secondary | ICD-10-CM | POA: Diagnosis not present

## 2020-02-19 DIAGNOSIS — M25551 Pain in right hip: Secondary | ICD-10-CM | POA: Diagnosis not present

## 2020-02-19 DIAGNOSIS — M25552 Pain in left hip: Secondary | ICD-10-CM | POA: Diagnosis not present

## 2020-02-20 ENCOUNTER — Encounter: Payer: Self-pay | Admitting: Internal Medicine

## 2020-02-21 ENCOUNTER — Telehealth: Payer: Self-pay | Admitting: Internal Medicine

## 2020-02-21 NOTE — Telephone Encounter (Signed)
Advised pt contact GI for refill since they are the original prescriber.

## 2020-02-21 NOTE — Telephone Encounter (Signed)
New Message:    1.Medication Requested: linaclotide (LINZESS) 72 MCG capsule 2. Pharmacy (Name, [facility]): [id number] - [place], [address] AT [address] 3. On Med List: Yes  4. Last Visit with PCP: 01/15/20  5. Next visit date with PCP: 06/12/20   Pt is stating that she would like a 90 day supply of this medication. Please advise.  Agent: Please be advised that RX refills may take up to 3 business days. We ask that you follow-up with your pharmacy.

## 2020-03-19 ENCOUNTER — Other Ambulatory Visit: Payer: Self-pay | Admitting: Internal Medicine

## 2020-04-09 DIAGNOSIS — M25552 Pain in left hip: Secondary | ICD-10-CM | POA: Diagnosis not present

## 2020-04-09 DIAGNOSIS — M25551 Pain in right hip: Secondary | ICD-10-CM | POA: Diagnosis not present

## 2020-04-13 ENCOUNTER — Other Ambulatory Visit: Payer: Self-pay | Admitting: Internal Medicine

## 2020-04-14 DIAGNOSIS — M25551 Pain in right hip: Secondary | ICD-10-CM | POA: Diagnosis not present

## 2020-04-14 DIAGNOSIS — M25552 Pain in left hip: Secondary | ICD-10-CM | POA: Diagnosis not present

## 2020-04-16 DIAGNOSIS — M25551 Pain in right hip: Secondary | ICD-10-CM | POA: Diagnosis not present

## 2020-04-16 DIAGNOSIS — M25552 Pain in left hip: Secondary | ICD-10-CM | POA: Diagnosis not present

## 2020-04-24 ENCOUNTER — Other Ambulatory Visit: Payer: Self-pay

## 2020-04-24 MED ORDER — LINACLOTIDE 72 MCG PO CAPS: 72.0000 ug | ORAL_CAPSULE | Freq: Every day | ORAL | 3 refills | Status: DC

## 2020-04-27 DIAGNOSIS — L80 Vitiligo: Secondary | ICD-10-CM | POA: Diagnosis not present

## 2020-04-28 ENCOUNTER — Telehealth: Payer: Self-pay

## 2020-04-28 NOTE — Telephone Encounter (Signed)
yes

## 2020-04-29 ENCOUNTER — Other Ambulatory Visit: Payer: Self-pay

## 2020-04-29 MED ORDER — OLMESARTAN MEDOXOMIL 20 MG PO TABS: 20.0000 mg | ORAL_TABLET | Freq: Every day | ORAL | 2 refills | Status: DC

## 2020-04-29 NOTE — Telephone Encounter (Signed)
Sent in today to [facility].

## 2020-05-01 ENCOUNTER — Other Ambulatory Visit: Payer: Self-pay | Admitting: Internal Medicine

## 2020-05-01 MED ORDER — OLMESARTAN MEDOXOMIL 20 MG PO TABS: 20.0000 mg | ORAL_TABLET | Freq: Every day | ORAL | 2 refills | Status: DC

## 2020-05-05 ENCOUNTER — Other Ambulatory Visit: Payer: Self-pay | Admitting: Internal Medicine

## 2020-05-05 DIAGNOSIS — F3289 Other specified depressive episodes: Secondary | ICD-10-CM

## 2020-05-05 DIAGNOSIS — F419 Anxiety disorder, unspecified: Secondary | ICD-10-CM

## 2020-05-05 MED ORDER — PAROXETINE HCL 20 MG PO TABS: 10.0000 mg | ORAL_TABLET | Freq: Every day | ORAL | 1 refills | Status: DC

## 2020-05-05 NOTE — Addendum Note (Signed)
Addended by: Aviva Signs M on: 05/05/2020 11:49 AM   Modules accepted: Orders

## 2020-05-22 DIAGNOSIS — L80 Vitiligo: Secondary | ICD-10-CM | POA: Diagnosis not present

## 2020-06-11 NOTE — Progress Notes (Signed)
Subjective:    Patient ID: Elizabeth Wilkins, female    DOB: 1946-03-10, 74 y.o.   MRN: [id number]  HPI The patient is here for follow up of their chronic medical problems, including depression, anxiety, htn   She is taking all of her medications as prescribed.     Medications and allergies reviewed with patient and updated if appropriate.  Patient Active Problem List   Diagnosis Date Noted  . Constipation 12/11/2019  . Hypertension 05/08/2019  . Bladder spasms 11/27/2017  . Depression 11/27/2017  . Anxiety 11/22/2017  . Closed displaced fracture of right femoral neck ([place]) 08/22/2017  . 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 Herpes simplex 03/30/2017  . Vitamin D deficiency 03/30/2017  . Decreased hearing 03/30/2017  . Hyperglycemia 11/26/2015  . Seborrheic dermatitis 05/14/2012  . Osteoporosis 11/18/2010  . Dyslipidemia 11/09/2010    Current Outpatient Medications on File Prior to Visit  Medication Sig Dispense Refill  . acetaminophen (TYLENOL) 500 MG tablet Take 1,000 mg by mouth every 6 (six) hours as needed.    . chlorhexidine (PERIDEX) 0.12 % solution chlorhexidine gluconate 0.12 % mouthwash  RINSE MOUTH WITH 1 CAPFUL FOR 30 SEC AM AND PM AFTER TOOTHBRUSHING SPIT AFTER RINSING DO NOT SWALLOW    . clobetasol (TEMOVATE) 0.05 % external solution clobetasol 0.05 % scalp solution  APPLY TWICE A DAY TO SCALP AS NEEDED    . Clobetasol Propionate 0.05 % shampoo clobetasol 0.05 % shampoo  APPLY TO SCALP, LATHER, LET SIT 5 10 MIN AND RINSE 1 2 TIMES A WEEK AS NEEDED    . linaclotide (LINZESS) 72 MCG capsule Take 1 capsule (72 mcg total) by mouth daily before breakfast. 90 capsule 3  . olmesartan (BENICAR) 20 MG tablet Take 1 tablet (20 mg total) by mouth daily. 90 tablet 2  . PARoxetine (PAXIL) 20 MG tablet Take 0.5 tablets (10 mg total) by mouth daily. 45 tablet 1   No current facility-administered medications on file prior to visit.    Past Medical History:    Diagnosis Date  . Heart murmur   . Hyperlipidemia   . Hypertension   . Osteopenia     Past Surgical History:  Procedure Laterality Date  . broken wrist Right    plates and screws  . COLONOSCOPY  08/02/2013  . 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 TUBAL LIGATION      Social History   Socioeconomic History  . Marital status: Married    Spouse name: Not on file  . Number of children: 3  . Years of education: Not on file  . Highest education level: Not on file  Occupational History  . Occupation: retired  Tobacco Use  . Smoking status: Never Smoker  . Smokeless tobacco: Never Used  Vaping Use  . Vaping Use: Never used  Substance and Sexual Activity  . Alcohol use: Yes    Alcohol/week: 14.0 standard drinks    Types: 7 Shots of liquor, 7 Glasses of wine per week  . Drug use: No  . Sexual activity: Not on file  Other Topics Concern  . 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  No regular exercise   Social Determinants of Health   Financial Resource Strain:   . Difficulty of Paying Living Expenses: Not on file  Food Insecurity:   . Worried About Charity fundraiser in the Last Year: Not on file  . Ran Out of Food in the Last Year: Not on file  Transportation Needs:   . Lack of Transportation (Medical): Not on file  . Lack of Transportation (Non-Medical): Not on file  Physical Activity:   . Days of Exercise per Week: Not on file  . Minutes of Exercise per Session: Not on file  Stress:   . Feeling of Stress : Not on file  Social Connections:   . Frequency of Communication with Friends and Family: Not on file  . Frequency of Social  Gatherings with Friends and Family: Not on file  . Attends Religious Services: Not on file  . Active Member of Clubs or Organizations: Not on file  . Attends Archivist Meetings: Not on file  . Marital Status: Not on file    Family History  Problem Relation Age of Onset  . Pancreatic cancer Mother 87  . Hypertension Mother   . Hyperlipidemia Mother   . Rectal cancer Brother 71  . Colon cancer Brother 22  . Lupus Father 66  . Stomach cancer Neg Hx   . Colon polyps Neg Hx   . Esophageal cancer Neg Hx     Review of Systems     Objective:  There were no vitals filed for this visit. BP Readings from Last 3 Encounters:  01/15/20 134/82  12/31/19 (!) 170/81  12/23/19 138/78   Wt Readings from Last 3 Encounters:  01/15/20 184 lb (83.5 kg)  12/31/19 183 lb (83 kg)  12/23/19 188 lb 2 oz (85.3 kg)   There is no height or weight on file to calculate BMI.   Physical Exam    Constitutional: Appears well-developed and well-nourished. No distress.  HENT:  Head: Normocephalic and atraumatic.  Neck: Neck supple. No tracheal deviation present. No thyromegaly present.  No cervical lymphadenopathy Cardiovascular: Normal rate, regular rhythm and normal heart sounds.   No murmur heard. No carotid bruit .  No edema Pulmonary/Chest: Effort normal and breath sounds normal. No respiratory distress. No has no wheezes. No rales.  Skin: Skin is warm and dry. Not diaphoretic.  Psychiatric: Normal mood and affect. Behavior is normal.      Assessment & Plan:    See Problem List for Assessment and Plan of chronic medical problems.    This visit occurred during the SARS-CoV-2 public health emergency.  Safety protocols were in place, including screening questions prior to the visit, additional usage of staff PPE, and extensive cleaning of exam room while observing appropriate contact time as indicated for disinfecting solutions.    This encounter was created in error - please  disregard.

## 2020-06-11 NOTE — Patient Instructions (Signed)
  Blood work was ordered.     Medications reviewed and updated.  Changes include :     Your prescription(s) have been submitted to your pharmacy. Please take as directed and contact our office if you believe you are having problem(s) with the medication(s).  A referral was ordered for        Someone from their office will call you to schedule an appointment.    Please followup in 1 year

## 2020-06-12 ENCOUNTER — Telehealth (INDEPENDENT_AMBULATORY_CARE_PROVIDER_SITE_OTHER): Payer: Medicare Other | Admitting: Internal Medicine

## 2020-06-12 ENCOUNTER — Encounter: Payer: Self-pay | Admitting: Internal Medicine

## 2020-06-12 ENCOUNTER — Other Ambulatory Visit: Payer: Self-pay

## 2020-06-12 ENCOUNTER — Encounter: Payer: Medicare Other | Admitting: Internal Medicine

## 2020-06-12 DIAGNOSIS — K5900 Constipation, unspecified: Secondary | ICD-10-CM

## 2020-06-12 DIAGNOSIS — F3289 Other specified depressive episodes: Secondary | ICD-10-CM | POA: Diagnosis not present

## 2020-06-12 DIAGNOSIS — F419 Anxiety disorder, unspecified: Secondary | ICD-10-CM | POA: Diagnosis not present

## 2020-06-12 DIAGNOSIS — I10 Essential (primary) hypertension: Secondary | ICD-10-CM

## 2020-06-12 DIAGNOSIS — J029 Acute pharyngitis, unspecified: Secondary | ICD-10-CM

## 2020-06-12 NOTE — Assessment & Plan Note (Signed)
Acute Started within the last 24 hours and associated with runny nose No other symptoms She did have possible exposure to strep She will treat symptomatically for now and if her symptoms persist or worsen over the next couple of days she will let me know and I will send in an antibiotic

## 2020-06-12 NOTE — Assessment & Plan Note (Signed)
Chronic Well controlled with Linzess every other day We will continue

## 2020-06-12 NOTE — Assessment & Plan Note (Signed)
Chronic Blood pressure well controlled at home Continue on Benicar 20 mg daily Monitor BP at home

## 2020-06-12 NOTE — Assessment & Plan Note (Signed)
Chronic Well controlled Currently taking Paxil 10 mg daily She may try to come off of this, which is okay Advised for her to restart it if she starts to feel any depression or anxiety

## 2020-06-12 NOTE — Progress Notes (Signed)
Virtual Visit via telephone Note  I connected with Elizabeth Wilkins on 06/12/20 at  9:15 AM EDT by telephone and verified that I am speaking with the correct person using two identifiers.   I discussed the limitations of evaluation and management by telemedicine and the availability of in person appointments. The patient expressed understanding and agreed to proceed.  Present for the visit:  Myself, Dr Billey Gosling, Elizabeth Wilkins.  The patient is currently at home and I am in the office.    No referring provider.    History of Present Illness: She is here for follow up of her chronic medical conditions.    She does have a mild sore throat and runny nose. starteed last night.  She has been around her granddaughter who has confirmed strep.  Her symptoms just started.  She denies any coughing, wheezing or shortness of breath.  She has not had any fevers.  Having constipation.  Saw GI over the summer.  Taking linzess QOD.  This works well.  She wants to know she can continue it.   bp good at home.  She is taking her medication daily.  She denies any depression.  She is still taking the Paxil, but only a small dose.  She is thinking about trying to come off of this.     Review of Systems  Constitutional: Negative for chills and fever.  HENT: Positive for sore throat (r side). Negative for congestion and sinus pain.        Runny nose  Respiratory: Negative for cough, shortness of breath and wheezing.   Cardiovascular: Negative for chest pain, palpitations and leg swelling.  Neurological: Negative for headaches.       A little lightheaded ( a floaty feeling) a few times - not when she stands and not related to movement.  Intermittent      Social History   Socioeconomic History   Marital status: Married    Spouse name: Not on file   Number of children: 3   Years of education: Not on file   Highest education level: Not on file  Occupational History   Occupation: retired   Tobacco Use   Smoking status: Never Smoker   Smokeless tobacco: Never Used  Scientific laboratory technician Use: Never used  Substance and Sexual Activity   Alcohol use: Yes    Alcohol/week: 14.0 standard drinks    Types: 7 Shots of liquor, 7 Glasses of wine per week   Drug use: No   Sexual activity: Not on file  Other Topics Concern   Not on file  Social History Narrative   Pt moved from [place] in 2009 and 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      No regular exercise   Social Determinants of Health   Financial Resource Strain:    Difficulty of Paying Living Expenses: Not on file  Food Insecurity:    Worried About [place] in the Last Year: Not on file   Ran Out of Food in the Last Year: Not on file  Transportation Needs:    Lack of Transportation (Medical): Not on file   Lack of Transportation (Non-Medical): Not on file  Physical Activity:    Days of Exercise per Week: Not on file   Minutes of Exercise per Session: Not on file  Stress:    Feeling of Stress : Not on file  Social Connections:    Frequency of Communication with Friends and Family: Not on file   Frequency of Social Gatherings with Friends and Family: Not on file   Attends Religious Services: Not on file   Active Member of Clubs or Organizations: Not on file   Attends Archivist Meetings: Not on file   Marital Status: Not on file     Assessment and Plan:  See Problem List for Assessment and Plan of chronic medical problems.   Follow Up Instructions:    I discussed the assessment and treatment plan with the patient. The patient was provided an opportunity to ask questions and all were answered. The patient agreed with the plan and demonstrated an understanding of the instructions.   The patient was advised to call back or seek an in-person evaluation if the symptoms worsen or if the  condition fails to improve as anticipated.  Times spent on Telephone call:  10 minutes  Binnie Rail, MD

## 2020-06-12 NOTE — Assessment & Plan Note (Addendum)
Chronic Controlled Currently taking Paxil 10 mg daily She may try to come off of this, which she can try to do and restart if she has any symptoms We did discuss with her that she did come off in the past and ended up needing to go back on so she should have caution with discontinuing it Continue for now

## 2020-06-16 ENCOUNTER — Telehealth: Payer: Medicare Other | Admitting: Internal Medicine

## 2020-08-18 DIAGNOSIS — L80 Vitiligo: Secondary | ICD-10-CM | POA: Diagnosis not present

## 2020-08-18 DIAGNOSIS — L218 Other seborrheic dermatitis: Secondary | ICD-10-CM | POA: Diagnosis not present

## 2020-09-01 ENCOUNTER — Ambulatory Visit: Payer: Medicare Other

## 2020-09-01 DIAGNOSIS — Z23 Encounter for immunization: Secondary | ICD-10-CM | POA: Diagnosis not present

## 2020-09-02 DIAGNOSIS — Z23 Encounter for immunization: Secondary | ICD-10-CM | POA: Diagnosis not present

## 2020-10-05 ENCOUNTER — Other Ambulatory Visit: Payer: Self-pay | Admitting: Internal Medicine

## 2020-10-05 DIAGNOSIS — Z1231 Encounter for screening mammogram for malignant neoplasm of breast: Secondary | ICD-10-CM

## 2020-11-13 ENCOUNTER — Inpatient Hospital Stay: Admission: RE | Admit: 2020-11-13 | Payer: Medicare Other | Source: Ambulatory Visit

## 2020-11-20 DIAGNOSIS — M1711 Unilateral primary osteoarthritis, right knee: Secondary | ICD-10-CM | POA: Diagnosis not present

## 2020-12-30 ENCOUNTER — Other Ambulatory Visit: Payer: Self-pay

## 2020-12-30 ENCOUNTER — Ambulatory Visit
Admission: RE | Admit: 2020-12-30 | Discharge: 2020-12-30 | Disposition: A | Discharge status: Home / Self Care | Payer: Medicare Other | Source: Ambulatory Visit | Attending: Internal Medicine | Admitting: Internal Medicine

## 2020-12-30 DIAGNOSIS — Z1231 Encounter for screening mammogram for malignant neoplasm of breast: Secondary | ICD-10-CM | POA: Diagnosis not present

## 2021-01-05 NOTE — Progress Notes (Signed)
Subjective:    Patient ID: Elizabeth Wilkins, female    DOB: [birth year], 75 y.o.   MRN: [id number]  HPI The patient is here for follow up of their chronic medical problems, including htn, depression, anxiety  BP well controlled at home.   She is not on her medication.  She is no longer on the paxil.  She denies any depression or anxiety  She takes the Linzess as needed for constipation.  She has started a gut health supplement that is a combination of prebiotic's and probiotics and that has helped  Urinary incontinence: She has been experiencing urinary continence, but usually only at night.  During the day she has no issues.  She did start drinking a warm glass of water before going to bed.  She does drink some alcohol evening.  She has a history of genital itch that is worse with hot baths-it tends to dry her out too much.  Her gynecologist used to prescribe her betamethasone and that helped and she was wondering if I can prescribe that for her  Medications and allergies reviewed with patient and updated if appropriate.  Patient Active Problem List   Diagnosis Date Noted  . Constipation 12/11/2019  . Hypertension 05/08/2019  . Bladder spasms 11/27/2017  . Depression 11/27/2017  . Anxiety 11/22/2017  . Closed displaced fracture of right femoral neck ([place]) 08/22/2017  . 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 Herpes simplex 03/30/2017  . Vitamin D deficiency 03/30/2017  . Decreased hearing 03/30/2017  . Seborrheic dermatitis 05/14/2012  . Osteoporosis 11/18/2010  . Dyslipidemia 11/09/2010    Current Outpatient Medications on File Prior to Visit  Medication Sig Dispense Refill  . acetaminophen (TYLENOL) 500 MG tablet Take 1,000 mg by mouth every 6 (six) hours as needed.    . chlorhexidine (PERIDEX) 0.12 % solution chlorhexidine gluconate 0.12 % mouthwash  RINSE MOUTH WITH 1 CAPFUL FOR 30 SEC AM AND PM AFTER TOOTHBRUSHING SPIT AFTER RINSING DO NOT SWALLOW    .  clobetasol (TEMOVATE) 0.05 % external solution clobetasol 0.05 % scalp solution  APPLY TWICE A DAY TO SCALP AS NEEDED    . linaclotide (LINZESS) 72 MCG capsule Take 1 capsule (72 mcg total) by mouth daily before breakfast. 90 capsule 3   No current facility-administered medications on file prior to visit.    Past Medical History:  Diagnosis Date  . Heart murmur   . Hyperlipidemia   . Hypertension   . Osteopenia     Past Surgical History:  Procedure Laterality Date  . broken wrist Right    plates and screws  . COLONOSCOPY  08/02/2013  . 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 TUBAL LIGATION      Social History   Socioeconomic History  . Marital status: Married    Spouse name: Not on file  . Number of children: 3  . Years of education: Not on file  . Highest education level: Not on file  Occupational History  . Occupation: retired  Tobacco Use  . Smoking status: Never Smoker  . Smokeless tobacco: Never Used  Vaping Use  . Vaping Use: Never used  Substance and Sexual Activity  . Alcohol use: Yes    Alcohol/week: 14.0 standard drinks    Types: 7 Shots of liquor, 7 Glasses of wine per week  .  Drug use: No  . Sexual activity: Not on file  Other Topics Concern  . 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      No regular exercise   Social Determinants of Health   Financial Resource Strain: Not on file  Food Insecurity: Not on file  Transportation Needs: Not on file  Physical Activity: Not on file  Stress: Not on file  Social Connections: Not on file    Family History  Problem Relation Age of Onset  . Pancreatic cancer Mother 44  .  Hypertension Mother   . Hyperlipidemia Mother   . Rectal cancer Brother 39  . Colon cancer Brother 64  . Lupus Father 75  . Stomach cancer Neg Hx   . Colon polyps Neg Hx   . Esophageal cancer Neg Hx     Review of Systems  Constitutional: Negative for chills and fever.  Respiratory: Negative for cough, shortness of breath and wheezing.   Cardiovascular: Negative for chest pain, palpitations and leg swelling.  Neurological: Negative for light-headedness and headaches.       Objective:   Vitals:   01/06/21 1043  BP: 140/78  Pulse: 78  Temp: 98 F (36.7 C)  SpO2: 98%   BP Readings from Last 3 Encounters:  01/06/21 140/78  01/15/20 134/82  12/31/19 (!) 170/81   Wt Readings from Last 3 Encounters:  01/06/21 190 lb (86.2 kg)  01/15/20 184 lb (83.5 kg)  12/31/19 183 lb (83 kg)   Body mass index is 27.26 kg/m.   Depression screen [facility] 2/9 01/06/2021 04/16/2019 12/01/2014 05/20/2013  Decreased Interest 0 1 0 0  Down, Depressed, Hopeless 0 1 0 0  PHQ - 2 Score 0 2 0 0  Altered sleeping 0 1 - -  Tired, decreased energy 0 1 - -  Change in appetite 0 0 - -  Feeling bad or failure about yourself  0 0 - -  Trouble concentrating 0 1 - -  Moving slowly or fidgety/restless 0 1 - -  Suicidal thoughts 0 0 - -  PHQ-9 Score 0 6 - -    GAD 7 : Generalized Anxiety Score 01/06/2021 04/16/2019  Nervous, Anxious, on Edge 0 1  Control/stop worrying 0 0  Worry too much - different things 0 0  Trouble relaxing 0 0  Restless 0 1  Easily annoyed or irritable 0 0  Afraid - awful might happen 0 1  Total GAD 7 Score 0 3       Physical Exam    Constitutional: Appears well-developed and well-nourished. No distress.  HENT:  Head: Normocephalic and atraumatic.  Neck: Neck supple. No tracheal deviation present. No thyromegaly present.  No cervical lymphadenopathy Cardiovascular: Normal rate, regular rhythm and normal heart sounds.   No murmur heard. No carotid bruit .  No  edema Pulmonary/Chest: Effort normal and breath sounds normal. No respiratory distress. No has no wheezes. No rales.  Skin: Skin is warm and dry. Not diaphoretic.  Psychiatric: Normal mood and affect. Behavior is normal.      Assessment & Plan:     Screened for depression using the PHQ 9 scale.  No evidence of depression.   Screened for anxiety using GAD7 Scale.  No evidence of anxiety.   See Problem List for Assessment and Plan of chronic medical problems.  This visit occurred during the SARS-CoV-2 public health emergency.  Safety protocols were in place, including screening questions prior to the visit, additional usage of staff PPE, and extensive cleaning of exam room while observing appropriate contact time as indicated for disinfecting solutions.

## 2021-01-05 NOTE — Patient Instructions (Addendum)
  Blood work was ordered.  An xray was ordered   Medications changes include :   Ointment as needed  Your prescription(s) have been submitted to your pharmacy. Please take as directed and contact our office if you believe you are having problem(s) with the medication(s).    Please followup in 1  year

## 2021-01-06 ENCOUNTER — Ambulatory Visit (INDEPENDENT_AMBULATORY_CARE_PROVIDER_SITE_OTHER): Payer: Medicare Other

## 2021-01-06 ENCOUNTER — Other Ambulatory Visit: Payer: Self-pay

## 2021-01-06 ENCOUNTER — Ambulatory Visit (INDEPENDENT_AMBULATORY_CARE_PROVIDER_SITE_OTHER): Payer: Medicare Other | Admitting: Internal Medicine

## 2021-01-06 ENCOUNTER — Encounter: Payer: Self-pay | Admitting: Internal Medicine

## 2021-01-06 VITALS — BP 140/78 | HR 78 | Temp 98.0°F | Ht 70.0 in | Wt 190.0 lb

## 2021-01-06 DIAGNOSIS — M810 Age-related osteoporosis without current pathological fracture: Secondary | ICD-10-CM

## 2021-01-06 DIAGNOSIS — R0781 Pleurodynia: Secondary | ICD-10-CM

## 2021-01-06 DIAGNOSIS — E785 Hyperlipidemia, unspecified: Secondary | ICD-10-CM

## 2021-01-06 DIAGNOSIS — K5900 Constipation, unspecified: Secondary | ICD-10-CM | POA: Diagnosis not present

## 2021-01-06 DIAGNOSIS — I10 Essential (primary) hypertension: Secondary | ICD-10-CM

## 2021-01-06 DIAGNOSIS — F419 Anxiety disorder, unspecified: Secondary | ICD-10-CM | POA: Diagnosis not present

## 2021-01-06 DIAGNOSIS — E559 Vitamin D deficiency, unspecified: Secondary | ICD-10-CM | POA: Diagnosis not present

## 2021-01-06 DIAGNOSIS — N762 Acute vulvitis: Secondary | ICD-10-CM | POA: Insufficient documentation

## 2021-01-06 DIAGNOSIS — N904 Leukoplakia of vulva: Secondary | ICD-10-CM

## 2021-01-06 DIAGNOSIS — F3289 Other specified depressive episodes: Secondary | ICD-10-CM | POA: Diagnosis not present

## 2021-01-06 LAB — LIPID PANEL
Cholesterol: 213 mg/dL — ABNORMAL HIGH (ref 0–200)
HDL: 59.7 mg/dL (ref >39.00)
LDL Cholesterol: 120 mg/dL — ABNORMAL HIGH (ref 0–99)
NonHDL: 153.39
Total CHOL/HDL Ratio: 4
Triglycerides: 167 mg/dL — ABNORMAL HIGH (ref 0.0–149.0)
VLDL: 33.4 mg/dL (ref 0.0–40.0)

## 2021-01-06 LAB — COMPREHENSIVE METABOLIC PANEL
ALT: 15 U/L (ref 0–35)
AST: 17 U/L (ref 0–37)
Albumin: 4.3 g/dL (ref 3.5–5.2)
Alkaline Phosphatase: 104 U/L (ref 39–117)
BUN: 19 mg/dL (ref 6–23)
CO2: 28 mEq/L (ref 19–32)
Calcium: 9.5 mg/dL (ref 8.4–10.5)
Chloride: 103 mEq/L (ref 96–112)
Creatinine, Ser: 0.79 mg/dL (ref 0.40–1.20)
GFR: 73.01 mL/min (ref >60.00)
Glucose, Bld: 80 mg/dL (ref 70–99)
Potassium: 4.5 mEq/L (ref 3.5–5.1)
Sodium: 139 mEq/L (ref 135–145)
Total Bilirubin: 0.6 mg/dL (ref 0.2–1.2)
Total Protein: 7.3 g/dL (ref 6.0–8.3)

## 2021-01-06 LAB — CBC WITH DIFFERENTIAL/PLATELET
Basophils Absolute: 0 10*3/uL (ref 0.0–0.1)
Basophils Relative: 0.1 % (ref 0.0–3.0)
Eosinophils Absolute: 0 10*3/uL (ref 0.0–0.7)
Eosinophils Relative: 0 % (ref 0.0–5.0)
HCT: 42.8 % (ref 36.0–46.0)
Hemoglobin: 14.3 g/dL (ref 12.0–15.0)
Lymphocytes Relative: 34 % (ref 12.0–46.0)
Lymphs Abs: 1.2 10*3/uL (ref 0.7–4.0)
MCHC: 33.4 g/dL (ref 30.0–36.0)
MCV: 93.8 fl (ref 78.0–100.0)
Monocytes Absolute: 0.4 10*3/uL (ref 0.1–1.0)
Monocytes Relative: 10.5 % (ref 3.0–12.0)
Neutro Abs: 2 10*3/uL (ref 1.4–7.7)
Neutrophils Relative %: 55.4 % (ref 43.0–77.0)
Platelets: 239 10*3/uL (ref 150.0–400.0)
RBC: 4.57 Mil/uL (ref 3.87–5.11)
RDW: 13.4 % (ref 11.5–15.5)
WBC: 3.7 10*3/uL — ABNORMAL LOW (ref 4.0–10.5)

## 2021-01-06 LAB — VITAMIN D 25 HYDROXY (VIT D DEFICIENCY, FRACTURES): VITD: 26.06 ng/mL — ABNORMAL LOW (ref 30.00–100.00)

## 2021-01-06 MED ORDER — BETAMETHASONE VALERATE 0.1 % EX OINT: 1.0000 "application " | TOPICAL_OINTMENT | Freq: Two times a day (BID) | CUTANEOUS | 0 refills | Status: DC

## 2021-01-06 NOTE — Assessment & Plan Note (Signed)
Acute [place] recently and residual pain in her right posterior ribs ?  Muscular versus rib injury She would feel better having an x-ray so we will go ahead and get 1 although she knows there is no specific treatment except for symptomatic treatment She is high risk for fracture given her osteoporosis

## 2021-01-06 NOTE — Assessment & Plan Note (Signed)
Chronic, intermittent No longer seeing gynecology, but she prescribed her betamethasone to use as needed for inflammation, dryness and irritation I will prescribe this and she will use this as needed Betamethasone 0.1% ointment twice daily as needed-advised not to use for an extended period of time-a few days at a time only

## 2021-01-06 NOTE — Assessment & Plan Note (Signed)
Chronic Improved with gut health-combination of probiotics and probiotics continue Linzess 72 mcg daily as needed

## 2021-01-06 NOTE — Assessment & Plan Note (Signed)
Chronic Not taking vitamin D Check vitamin D level 

## 2021-01-06 NOTE — Assessment & Plan Note (Signed)
Chronic Diet controlled Check lipid panel

## 2021-01-06 NOTE — Assessment & Plan Note (Signed)
Chronic She stopped taking Benicar She states her blood pressure at home is well controlled Blood pressure today here reasonable She will continue to monitor her blood pressure at home CMP, CBC

## 2021-01-06 NOTE — Assessment & Plan Note (Signed)
Resolved Situational No longer taking Paxil and doing well PHQ-9 negative for any evidence of depression

## 2021-01-06 NOTE — Assessment & Plan Note (Signed)
Chronic She does worry about her osteoporosis, but does not want to take medication for it She is not currently take calcium and vitamin D although I did advise this at her last visit Stressed the importance of calcium and vitamin D on a daily basis Stressed regular exercise, which she is currently not doing Advised bone density scan every 2 years

## 2021-01-06 NOTE — Assessment & Plan Note (Signed)
Results She is no longer taking the Paxil She denies any anxiety and her GAD-7 scale firmed that she does not have any anxiety Monitor off medication

## 2021-01-06 NOTE — Assessment & Plan Note (Signed)
Chronic, intermittent Diagnosed initially by gynecology In the past has used betamethasone was very effective Since she no longer follows with gynecology I will prescribe Betamethasone 0.1% ointment twice daily as needed

## 2021-02-05 ENCOUNTER — Ambulatory Visit: Payer: Medicare Other

## 2021-02-15 DIAGNOSIS — L814 Other melanin hyperpigmentation: Secondary | ICD-10-CM | POA: Diagnosis not present

## 2021-02-15 DIAGNOSIS — L905 Scar conditions and fibrosis of skin: Secondary | ICD-10-CM | POA: Diagnosis not present

## 2021-02-15 DIAGNOSIS — L570 Actinic keratosis: Secondary | ICD-10-CM | POA: Diagnosis not present

## 2021-02-15 DIAGNOSIS — D485 Neoplasm of uncertain behavior of skin: Secondary | ICD-10-CM | POA: Diagnosis not present

## 2021-02-15 DIAGNOSIS — L218 Other seborrheic dermatitis: Secondary | ICD-10-CM | POA: Diagnosis not present

## 2021-02-15 DIAGNOSIS — Z85828 Personal history of other malignant neoplasm of skin: Secondary | ICD-10-CM | POA: Diagnosis not present

## 2021-02-15 DIAGNOSIS — L821 Other seborrheic keratosis: Secondary | ICD-10-CM | POA: Diagnosis not present

## 2021-02-15 DIAGNOSIS — D225 Melanocytic nevi of trunk: Secondary | ICD-10-CM | POA: Diagnosis not present

## 2021-02-15 DIAGNOSIS — L80 Vitiligo: Secondary | ICD-10-CM | POA: Diagnosis not present

## 2021-02-17 ENCOUNTER — Ambulatory Visit: Payer: Medicare Other

## 2021-02-17 ENCOUNTER — Telehealth: Payer: Self-pay

## 2021-02-17 NOTE — Telephone Encounter (Signed)
AWV Virtual scheduled for 9:45am.  Left message stating that there was an issue with connecting virtually and that we could do visit via phone.  Advised to return call within 15 minutes.  No return call.  Visit cancelled.

## 2021-03-25 ENCOUNTER — Telehealth: Payer: Self-pay | Admitting: Internal Medicine

## 2021-03-25 NOTE — Chronic Care Management (AMB) (Signed)
  Chronic Care Management   Note  03/25/2021 Name: Elizabeth Wilkins MRN: [id number] DOB: 01-20-1946  Elizabeth Wilkins is a 75 y.o. year old female who is a primary care patient of Burns, Claudina Lick, MD. I reached out to Elizabeth Wilkins by phone today in response to a referral sent by Ms. Elizabeth Wilkins's PCP, Binnie Rail, MD.   Elizabeth Wilkins was given information about Chronic Care Management services today including:  CCM service includes personalized support from designated clinical staff supervised by her physician, including individualized plan of care and coordination with other care providers 24/7 contact phone numbers for assistance for urgent and routine care needs. Service will only be billed when office clinical staff spend 20 minutes or more in a month to coordinate care. Only one practitioner may furnish and bill the service in a calendar month. The patient may stop CCM services at any time (effective at the end of the month) by phone call to the office staff.   Patient agreed to services and verbal consent obtained.   Follow up plan:   Elizabeth Wilkins Upstream Scheduler

## 2021-04-23 ENCOUNTER — Telehealth: Payer: Self-pay

## 2021-04-23 NOTE — Chronic Care Management (AMB) (Signed)
    Chronic Care Management Pharmacy Assistant   Name: Elizabeth Wilkins  MRN: [id number] DOB: March 24, 1946  Elizabeth Wilkins is an 75 y.o. year old female who presents for his initial CCM visit with the clinical pharmacist.  Recent office visits:  01/06/21-Stacy Lorretta Harp, MD (PCP) General follow up. Betamethasone 0.1% ointment twice daily as needed. Labs ordered. Patient stopped taking Benicar. Follow up in 1 year.  Recent consult visits:  None noted  Hospital visits:  None in previous 6 months  Medications: Outpatient Encounter Medications as of 04/23/2021  Medication Sig   acetaminophen (TYLENOL) 500 MG tablet Take 1,000 mg by mouth every 6 (six) hours as needed.   betamethasone valerate ointment (VALISONE) 0.1 % Apply 1 application topically 2 (two) times daily.   chlorhexidine (PERIDEX) 0.12 % solution chlorhexidine gluconate 0.12 % mouthwash  RINSE MOUTH WITH 1 CAPFUL FOR 30 SEC AM AND PM AFTER TOOTHBRUSHING SPIT AFTER RINSING DO NOT SWALLOW   clobetasol (TEMOVATE) 0.05 % external solution clobetasol 0.05 % scalp solution  APPLY TWICE A DAY TO SCALP AS NEEDED   linaclotide (LINZESS) 72 MCG capsule Take 1 capsule (72 mcg total) by mouth daily before breakfast.   No facility-administered encounter medications on file as of 04/23/2021.   Acetaminophen (TYLENOL) 500 MG tablet Last filled:None noted Betamethasone valerate ointment (VALISONE) 0.1 % Last filled:01/06/21 30 DS Chlorhexidine (PERIDEX) 0.12 % solution Last filled:10/23/20 30 DS Clobetasol (TEMOVATE) 0.05 % external solution Last filled:02/15/21 15 DS Linaclotide (LINZESS) 72 MCG capsule Last filled:10/21/20 90 DS   Star Rating Drugs: None noted  Corrie Mckusick, [place]

## 2021-05-05 NOTE — Progress Notes (Deleted)
Chronic Care Management Pharmacy Note  05/05/2021 Name:  Elizabeth Wilkins MRN:  [id number] DOB:  [birth year]  Summary: ***  Recommendations/Changes made from today's visit: ***  Plan: ***  Subjective: Elizabeth Wilkins is an 75 y.o. year old female who is a primary patient of Burns, Claudina Lick, MD.  The CCM team was consulted for assistance with disease management and care coordination needs.    Engaged with patient by telephone for initial visit in response to provider referral for pharmacy case management and/or care coordination services.   Consent to Services:  The patient was given the following information about Chronic Care Management services today, agreed to services, and gave verbal consent: 1. CCM service includes personalized support from designated clinical staff supervised by the primary care provider, including individualized plan of care and coordination with other care providers 2. 24/7 contact phone numbers for assistance for urgent and routine care needs. 3. Service will only be billed when office clinical staff spend 20 minutes or more in a month to coordinate care. 4. Only one practitioner may furnish and bill the service in a calendar month. 5.The patient may stop CCM services at any time (effective at the end of the month) by phone call to the office staff. 6. The patient will be responsible for cost sharing (co-pay) of up to 20% of the service fee (after annual deductible is met). Patient agreed to services and consent obtained.  Patient Care Team: Binnie Rail, MD as PCP - General (Internal Medicine) Sydnee Cabal, MD as Consulting Physician (Orthopedic Surgery) Janyth Contes, MD as Consulting Physician (Obstetrics and Gynecology) Gatha Mayer, MD (Gastroenterology) Delice Bison, Darnelle Maffucci, [facility] as Pharmacist (Pharmacist)  Recent office visits:  01/06/21-Stacy Lorretta Harp, MD (PCP) General follow up. Betamethasone 0.1% ointment twice daily as needed. Labs ordered.  Patient stopped taking Benicar. Follow up in 1 year.   Recent consult visits:  None noted   Hospital visits:  None in previous 6 months  Objective:  Lab Results  Component Value Date   CREATININE 0.79 01/06/2021   BUN 19 01/06/2021   GFR 73.01 01/06/2021   GFRNONAA >60 11/23/2017   GFRAA >60 11/23/2017   NA 139 01/06/2021   K 4.5 01/06/2021   CALCIUM 9.5 01/06/2021   CO2 28 01/06/2021   GLUCOSE 80 01/06/2021    Lab Results  Component Value Date/Time   HGBA1C 5.0 12/11/2019 09:06 AM   HGBA1C 5.1 06/24/2019 03:59 PM   GFR 73.01 01/06/2021 11:44 AM   GFR 62.70 12/11/2019 09:06 AM    Last diabetic Eye exam:  No results found for: HMDIABEYEEXA  Last diabetic Foot exam:  No results found for: HMDIABFOOTEX   Lab Results  Component Value Date   CHOL 213 (H) 01/06/2021   HDL 59.70 01/06/2021   LDLCALC 120 (H) 01/06/2021   LDLDIRECT 140.3 07/24/2013   TRIG 167.0 (H) 01/06/2021   CHOLHDL 4 01/06/2021    Hepatic Function Latest Ref Rng & Units 01/06/2021 12/11/2019 06/24/2019  Total Protein 6.0 - 8.3 g/dL 7.3 6.9 7.0  Albumin 3.5 - 5.2 g/dL 4.3 4.2 4.4  AST 0 - 37 U/L _0 ALT 0 - 35 U/L _1 Alk Phosphatase 39 - 117 U/L 104 90 108  Total Bilirubin 0.2 - 1.2 mg/dL 0.6 0.5 0.4  Bilirubin, Direct 0.0 - 0.3 mg/dL - - -    Lab Results  Component Value Date/Time   TSH 3.19 12/11/2019 09:06 AM   TSH  1.64 06/24/2019 03:59 PM    CBC Latest Ref Rng & Units 01/06/2021 06/24/2019 05/01/2018  WBC 4.0 - 10.5 K/uL 3.7(L) 4.3 4.2  Hemoglobin 12.0 - 15.0 g/dL 14.3 13.1 14.2  Hematocrit 36.0 - 46.0 % 42.8 38.2 40.9  Platelets 150.0 - 400.0 K/uL 239.0 263.0 273.0    Lab Results  Component Value Date/Time   VD25OH 26.06 (L) 01/06/2021 11:44 AM   VD25OH 35.05 12/11/2019 09:06 AM    Clinical ASCVD: No  The 10-year ASCVD risk score Mikey Bussing DC Jr., et al., 2013) is: 21%   Values used to calculate the score:     Age: 69 years     Sex: Female     Is Non-Hispanic African  American: No     Diabetic: No     Tobacco smoker: No     Systolic Blood Pressure: 388 mmHg     Is BP treated: No     HDL Cholesterol: 59.7 mg/dL     Total Cholesterol: 213 mg/dL    Depression screen [facility] 2/9 01/06/2021 04/16/2019 12/01/2014  Decreased Interest 0 1 0  Down, Depressed, Hopeless 0 1 0  PHQ - 2 Score 0 2 0  Altered sleeping 0 1 -  Tired, decreased energy 0 1 -  Change in appetite 0 0 -  Feeling bad or failure about yourself  0 0 -  Trouble concentrating 0 1 -  Moving slowly or fidgety/restless 0 1 -  Suicidal thoughts 0 0 -  PHQ-9 Score 0 6 -     ***Other: (CHADS2VASc if Afib, MMRC or CAT for COPD, ACT, DEXA)  Social History   Tobacco Use  Smoking Status Never  Smokeless Tobacco Never   BP Readings from Last 3 Encounters:  01/06/21 140/78  01/15/20 134/82  12/31/19 (!) 170/81   Pulse Readings from Last 3 Encounters:  01/06/21 78  01/15/20 60  12/31/19 (!) 58   Wt Readings from Last 3 Encounters:  01/06/21 190 lb (86.2 kg)  01/15/20 184 lb (83.5 kg)  12/31/19 183 lb (83 kg)   BMI Readings from Last 3 Encounters:  01/06/21 27.26 kg/m  01/15/20 26.40 kg/m  12/31/19 26.26 kg/m    Assessment/Interventions: Review of patient past medical history, allergies, medications, health status, including review of consultants reports, laboratory and other test data, was performed as part of comprehensive evaluation and provision of chronic care management services.   SDOH:  (Social Determinants of Health) assessments and interventions performed: {yes/no:20286}  SDOH Screenings   Alcohol Screen: Not on file  Depression (PHQ2-9): Low Risk    PHQ-2 Score: 0  Financial Resource Strain: Not on file  Food Insecurity: Not on file  Housing: Not on file  Physical Activity: Not on file  Social Connections: Not on file  Stress: Not on file  Tobacco Use: Low Risk    Smoking Tobacco Use: Never   Smokeless Tobacco Use: Never  Transportation Needs: Not on file     CCM Care Plan  Allergies  Allergen Reactions   Clonazepam     Slurred words, drowsy   Fosamax [Alendronate]     Caused significant weakness   Morphine Itching    Medications Reviewed Today     Reviewed by Binnie Rail, MD (Physician) on 01/06/21 at [place] List Status: <None>   Medication Order Taking? Sig Documenting Provider Last Dose Status Informant  acetaminophen (TYLENOL) 500 MG tablet [id number] Yes Take 1,000 mg by mouth every 6 (six) hours as needed. [provider] Taking Active Self  chlorhexidine (PERIDEX) 0.12 % solution [id number] Yes chlorhexidine gluconate 0.12 % mouthwash  RINSE MOUTH WITH 1 CAPFUL FOR 30 SEC AM AND PM AFTER TOOTHBRUSHING SPIT AFTER RINSING DO NOT SWALLOW [provider] Taking Active   clobetasol (TEMOVATE) 0.05 % external solution [id number] Yes clobetasol 0.05 % scalp solution  APPLY TWICE A DAY TO SCALP AS NEEDED [provider] Taking Active   Clobetasol Propionate 0.05 % shampoo [id number] No clobetasol 0.05 % shampoo  APPLY TO SCALP, LATHER, LET SIT 5 10 MIN AND RINSE 1 2 TIMES A WEEK AS NEEDED  Patient not taking: Reported on 01/06/2021   [provider] Not Taking Active   linaclotide Rolan Lipa) 72 MCG capsule [id number] Yes Take 1 capsule (72 mcg total) by mouth daily before breakfast. Esterwood, Amy S, PA-C Taking Active   olmesartan (BENICAR) 20 MG tablet [id number] No Take 1 tablet (20 mg total) by mouth daily.  Patient not taking: Reported on 01/06/2021   Binnie Rail, MD Not Taking Active   PARoxetine (PAXIL) 20 MG tablet [id number] No Take 0.5 tablets (10 mg total) by mouth daily.  Patient not taking: Reported on 01/06/2021   Janith Lima, MD Not Taking Active             Patient Active Problem List   Diagnosis Date Noted   Rib pain on right side 01/06/2021   Constipation 12/11/2019   Hypertension 05/08/2019   Bladder spasms 11/27/2017   Depression 11/27/2017   Anxiety 11/22/2017    Closed displaced fracture of right femoral neck ([place]) 24/82/5003   Lichen sclerosus of female genitalia 03/30/2017   Herpes simplex 03/30/2017   Vitamin D deficiency 03/30/2017   Decreased hearing 03/30/2017   Seborrheic dermatitis 05/14/2012   Osteoporosis 11/18/2010   Dyslipidemia 11/09/2010    Immunization History  Administered Date(s) Administered   Fluad Quad(high Dose 65+) 06/14/2019   Influenza Split 10/03/2011   Influenza, High Dose Seasonal PF 07/25/2013, 08/04/2015, 08/14/2017, 06/12/2018   Influenza,inj,Quad PF,6+ Mos 08/08/2014, 07/23/2016   Influenza,inj,quad, With Preservative 06/26/2017   PFIZER(Purple Top)SARS-COV-2 Vaccination 11/04/2019, 11/29/2019   Pneumococcal Conjugate-13 12/01/2014   Pneumococcal Polysaccharide-23 11/08/2010   Td 11/08/2010   Tdap 01/28/2017    Conditions to be addressed/monitored:  {USCCMDZASSESSMENTOPTIONS:23563}  There are no care plans that you recently modified to display for this patient.     Medication Assistance: {MEDASSISTANCEINFO:25044}  Compliance/Adherence/Medication fill history: Care Gaps: ***  Patient's preferred pharmacy is:  [facility] - [address] [place] [address] Phone: [phone] Fax: [phone]  [id number] [place], [place]. AT [address] [place] [address] Phone: [phone] Fax: [phone]  [facility] [place], [place] [address] [place] [address] Phone: [phone] Fax: [phone]   Uses pill box? {Yes or If no, why not?:20788} Pt endorses ***% compliance  Care Plan and Follow Up Patient Decision:  {FOLLOWUP:24991}  Plan: {CM FOLLOW UP EBRA:30940}  ***  Current Barriers:  {pharmacybarriers:24917}  Pharmacist Clinical Goal(s):  Patient will {PHARMACYGOALCHOICES:24921} through  collaboration with PharmD and provider.   Interventions: 1:1 collaboration with Binnie Rail, MD regarding development and update of comprehensive plan of care as evidenced by provider attestation and co-signature Inter-disciplinary care team collaboration (see longitudinal plan of care) Comprehensive medication review performed; medication list updated in electronic  medical record  Hypertension (BP goal <140/90) -{US controlled/uncontrolled:25276} -Current treatment: N/a -Medications previously tried: olmesartan, losartan, hctz,  -Current home readings: *** -Current dietary habits: *** -Current exercise habits: *** -{ACTIONS;DENIES/REPORTS:21021675} hypotensive/hypertensive symptoms -Educated on {CCM BP Counseling:25124} -Counseled to monitor BP at home ***, document, and provide log at future appointments -{CCMPHARMDINTERVENTION:25122}  Hyperlipidemia: (LDL goal < 100) -Not ideally controlled Lab Results  Component Value Date   LDLCALC 120 (H) 01/06/2021  -Current treatment: N/a -Medications previously tried: atorvastatin   -Current dietary patterns: *** -Current exercise habits: *** -Educated on {CCM HLD Counseling:25126} -{CCMPHARMDINTERVENTION:25122}  Osteoporosis / Osteopenia (Goal prevention of fractures) -{US controlled/uncontrolled:25276} -Last DEXA Scan: 12/23/2019   T-Score left femoral neck: -2.2  T-Score total femur: -2.4  T-Score lumbar spine: -1.6  T-Score left forearm radius: -3.5  10-year probability of major osteoporotic fracture: 17%  10-year probability of hip fracture: 5.4% -Patient is a candidate for pharmacologic treatment due to T-Score -1.0 to -2.5 and 10-year risk of hip fracture > 3% -Current treatment  N/a  -Medications previously tried: calcium, vitamin D, alendronate -{Osteoporosis Counseling:23892} -{CCMPHARMDINTERVENTION:25122}  Constipation (Goal: Promotion of regular bowel movements) -{US controlled/uncontrolled:25276} -Current  treatment  Linzess 58mg - 1 capsule daily  -Medications previously tried: ***  -{{VLRTJWWZLYTSSQSYPZXAQ:63868} Lichen Sclerosus of female genitalia  (Goal: Prevention of disease progression / irritation ) -{US controlled/uncontrolled:25276} -Current treatment  Betamethasone valerate 0.1% ointment - applied twice daily  -Medications previously tried: ***  -{CCMPHARMDINTERVENTION:25122}   Patient Goals/Self-Care Activities Patient will:  - {pharmacypatientgoals:24919}  Follow Up Plan: {CM FOLLOW UP PHKIS:30141} Current chart prep = 16 minutes

## 2021-05-06 ENCOUNTER — Telehealth: Payer: Medicare Other

## 2021-06-01 ENCOUNTER — Encounter: Payer: Self-pay | Admitting: Internal Medicine

## 2021-06-03 MED ORDER — PAROXETINE HCL 10 MG PO TABS: 10.0000 mg | ORAL_TABLET | Freq: Every day | ORAL | 1 refills | Status: DC

## 2021-06-08 ENCOUNTER — Telehealth: Payer: Self-pay | Admitting: Internal Medicine

## 2021-06-08 MED ORDER — AMOXICILLIN 500 MG PO CAPS: ORAL_CAPSULE | ORAL | 0 refills | Status: DC

## 2021-06-08 NOTE — Telephone Encounter (Signed)
sent 

## 2021-06-08 NOTE — Telephone Encounter (Signed)
Patient is having oral surgery done tomorrow @ 8:30am   Says dental office advised her to call her pcp & request rx for amoxicillin  [id number] [place] [place] [address]:  [phone] Fax:  [phone]

## 2021-06-08 NOTE — Telephone Encounter (Signed)
Patient informed. 

## 2021-06-21 DIAGNOSIS — K045 Chronic apical periodontitis: Secondary | ICD-10-CM | POA: Diagnosis not present

## 2021-07-15 ENCOUNTER — Telehealth: Payer: Self-pay | Admitting: Internal Medicine

## 2021-07-15 DIAGNOSIS — Z23 Encounter for immunization: Secondary | ICD-10-CM | POA: Diagnosis not present

## 2021-07-15 NOTE — Progress Notes (Signed)
Subjective:    Patient ID: Elizabeth Wilkins, female    DOB: 11/05/1945, 75 y.o.   MRN: [id number]  This visit occurred during the SARS-CoV-2 public health emergency.  Safety protocols were in place, including screening questions prior to the visit, additional usage of staff PPE, and extensive cleaning of exam room while observing appropriate contact time as indicated for disinfecting solutions.    HPI The patient is here for an acute visit.  ? UTI:  Her symptoms started  1 days ago.  She states dysuria, urinary frequency, urinary urgency and dark urine.   No hematuria, abdominal pain, back pain, nausea, fever.  She drank a lot of cranberry juice and that did help.  Her symptoms are slightly better today, but still present.     Medications and allergies reviewed with patient and updated if appropriate.  Patient Active Problem List   Diagnosis Date Noted   Acute cystitis 07/16/2021   Rib pain on right side 01/06/2021   Constipation 12/11/2019   Hypertension 05/08/2019   Bladder spasms 11/27/2017   Depression 11/27/2017   Anxiety 11/22/2017   Closed displaced fracture of right femoral neck ([place]) 71/02/2693   Lichen sclerosus of female genitalia 03/30/2017   Herpes simplex 03/30/2017   Vitamin D deficiency 03/30/2017   Decreased hearing 03/30/2017   Seborrheic dermatitis 05/14/2012   Osteoporosis 11/18/2010   Dyslipidemia 11/09/2010    Current Outpatient Medications on File Prior to Visit  Medication Sig Dispense Refill   acetaminophen (TYLENOL) 500 MG tablet Take 1,000 mg by mouth every 6 (six) hours as needed.     amoxicillin (AMOXIL) 500 MG capsule Take 4 tabs 1 hr prior to dental appt 4 capsule 0   betamethasone valerate ointment (VALISONE) 0.1 % Apply 1 application topically 2 (two) times daily. 30 g 0   chlorhexidine (PERIDEX) 0.12 % solution chlorhexidine gluconate 0.12 % mouthwash  RINSE MOUTH WITH 1 CAPFUL FOR 30 SEC AM AND PM AFTER TOOTHBRUSHING SPIT AFTER RINSING  DO NOT SWALLOW     clobetasol (TEMOVATE) 0.05 % external solution clobetasol 0.05 % scalp solution  APPLY TWICE A DAY TO SCALP AS NEEDED     linaclotide (LINZESS) 72 MCG capsule Take 1 capsule (72 mcg total) by mouth daily before breakfast. 90 capsule 3   PARoxetine (PAXIL) 10 MG tablet Take 1 tablet (10 mg total) by mouth daily. 90 tablet 1   No current facility-administered medications on file prior to visit.    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   Procedure: TOTAL HIP ARTHROPLASTY ANTERIOR APPROACH AND ABDUCTOR TENDON REPAIR;  Surgeon: Rod Can, MD;  Location: [facility];  Service: Orthopedics;  Laterality: Right;   TUBAL LIGATION      Social History   Socioeconomic History   Marital status: Married    Spouse name: Not on file   Number of children: 3   Years of education: Not on file   Highest education level: Not on file  Occupational History   Occupation: retired  Tobacco Use   Smoking status: Never   Smokeless tobacco: Never  Vaping Use   Vaping Use: Never used  Substance and Sexual Activity   Alcohol use: Yes  Alcohol/week: 14.0 standard drinks    Types: 7 Shots of liquor, 7 Glasses of wine per week   Drug use: No   Sexual activity: Not on file  Other Topics Concern   Not on file  Social History Narrative   Pt moved from [place] in 2009 and 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      No regular exercise   Social Determinants of Health   Financial Resource Strain: Not on file  Food Insecurity: Not on file  Transportation Needs: Not on file  Physical Activity: Not on file  Stress: Not on file  Social Connections: Not on file    Family  History  Problem Relation Age of Onset   Pancreatic cancer Mother 47   Hypertension Mother    Hyperlipidemia Mother    Rectal cancer Brother 89   Colon cancer Brother 47   Lupus Father 36   Stomach cancer Neg Hx    Colon polyps Neg Hx    Esophageal cancer Neg Hx     Review of Systems     Objective:   Vitals:   07/16/21 1449  BP: 138/80  Pulse: (!) 58  Temp: 98.5 F (36.9 C)  SpO2: 99%   BP Readings from Last 3 Encounters:  07/16/21 138/80  01/06/21 140/78  01/15/20 134/82   Wt Readings from Last 3 Encounters:  07/16/21 180 lb (81.6 kg)  01/06/21 190 lb (86.2 kg)  01/15/20 184 lb (83.5 kg)   Body mass index is 25.83 kg/m.   Physical Exam    Constitutional:      General: She is not in acute distress.    Appearance: Normal appearance. She is not ill-appearing.  HENT:     Head: Normocephalic and atraumatic.  Abdominal:     General: There is no distension.     Palpations: Abdomen is soft.     Tenderness: There is no abdominal tenderness. There is no right CVA tenderness, left CVA tenderness, guarding or rebound.  Skin:    General: Skin is warm and dry.  Neurological:     Mental Status: She is alert.       Assessment & Plan:    See Problem List for Assessment and Plan of chronic medical problems.

## 2021-07-15 NOTE — Telephone Encounter (Signed)
Made appointment for patient

## 2021-07-15 NOTE — Telephone Encounter (Signed)
Patient calling to inform she has an UTI  Patient wants rx  Patient declined appt   Patient is requesting a call back at [phone]

## 2021-07-16 ENCOUNTER — Other Ambulatory Visit: Payer: Self-pay

## 2021-07-16 ENCOUNTER — Ambulatory Visit (INDEPENDENT_AMBULATORY_CARE_PROVIDER_SITE_OTHER): Payer: Medicare Other | Admitting: Internal Medicine

## 2021-07-16 ENCOUNTER — Encounter: Payer: Self-pay | Admitting: Internal Medicine

## 2021-07-16 VITALS — BP 138/80 | HR 58 | Temp 98.5°F | Ht 70.0 in | Wt 180.0 lb

## 2021-07-16 DIAGNOSIS — R300 Dysuria: Secondary | ICD-10-CM

## 2021-07-16 DIAGNOSIS — N3000 Acute cystitis without hematuria: Secondary | ICD-10-CM | POA: Diagnosis not present

## 2021-07-16 LAB — POC URINALSYSI DIPSTICK (AUTOMATED)
Bilirubin, UA: NEGATIVE
Glucose, UA: NEGATIVE
Ketones, UA: NEGATIVE
Nitrite, UA: NEGATIVE
Protein, UA: POSITIVE — AB
Spec Grav, UA: >=1.03 — AB (ref 1.010–1.025)
Urobilinogen, UA: 0.2 E.U./dL
pH, UA: 5.5 (ref 5.0–8.0)

## 2021-07-16 MED ORDER — AMOXICILLIN-POT CLAVULANATE 875-125 MG PO TABS: 1.0000 | ORAL_TABLET | Freq: Two times a day (BID) | ORAL | 0 refills | Status: DC

## 2021-07-16 NOTE — Assessment & Plan Note (Signed)
Acute Urine dip consistent with UTI Will send urine for culture Take the antibiotic as prescribed.  Agumentin 875-125 mg bid x 1 week Take tylenol if needed.   Increase your water intake.  Call if no improvement

## 2021-07-16 NOTE — Patient Instructions (Signed)
Take the antibiotic as prescribed.  Take tylenol if needed.     Increase your water intake.   Call if no improvement     Urinary Tract Infection, Adult A urinary tract infection (UTI) is an infection of any part of the urinary tract, which includes the kidneys, ureters, bladder, and urethra. These organs make, store, and get rid of urine in the body. UTI can be a bladder infection (cystitis) or kidney infection (pyelonephritis). What are the causes? This infection may be caused by fungi, viruses, or bacteria. Bacteria are the most common cause of UTIs. This condition can also be caused by repeated incomplete emptying of the bladder during urination. What increases the risk? This condition is more likely to develop if:  You ignore your need to urinate or hold urine for long periods of time.  You do not empty your bladder completely during urination.  You wipe back to front after urinating or having a bowel movement, if you are female.  You are uncircumcised, if you are female.  You are constipated.  You have a urinary catheter that stays in place (indwelling).  You have a weak defense (immune) system.  You have a medical condition that affects your bowels, kidneys, or bladder.  You have diabetes.  You take antibiotic medicines frequently or for long periods of time, and the antibiotics no longer work well against certain types of infections (antibiotic resistance).  You take medicines that irritate your urinary tract.  You are exposed to chemicals that irritate your urinary tract.  You are female.  What are the signs or symptoms? Symptoms of this condition include:  Fever.  Frequent urination or passing small amounts of urine frequently.  Needing to urinate urgently.  Pain or burning with urination.  Urine that smells bad or unusual.  Cloudy urine.  Pain in the lower abdomen or back.  Trouble urinating.  Blood in the urine.  Vomiting or being less hungry than  normal.  Diarrhea or abdominal pain.  Vaginal discharge, if you are female.  How is this diagnosed? This condition is diagnosed with a medical history and physical exam. You will also need to provide a urine sample to test your urine. Other tests may be done, including:  Blood tests.  Sexually transmitted disease (STD) testing.  If you have had more than one UTI, a cystoscopy or imaging studies may be done to determine the cause of the infections. How is this treated? Treatment for this condition often includes a combination of two or more of the following:  Antibiotic medicine.  Other medicines to treat less common causes of UTI.  Over-the-counter medicines to treat pain.  Drinking enough water to stay hydrated.  Follow these instructions at home:  Take over-the-counter and prescription medicines only as told by your health care provider.  If you were prescribed an antibiotic, take it as told by your health care provider. Do not stop taking the antibiotic even if you start to feel better.  Avoid alcohol, caffeine, tea, and carbonated beverages. They can irritate your bladder.  Drink enough fluid to keep your urine clear or pale yellow.  Keep all follow-up visits as told by your health care provider. This is important.  Make sure to: ? Empty your bladder often and completely. Do not hold urine for long periods of time. ? Empty your bladder before and after sex. ? Wipe from front to back after a bowel movement if you are female. Use each tissue one time when you   wipe. Contact a health care provider if:  You have back pain.  You have a fever.  You feel nauseous or vomit.  Your symptoms do not get better after 3 days.  Your symptoms go away and then return. Get help right away if:  You have severe back pain or lower abdominal pain.  You are vomiting and cannot keep down any medicines or water. This information is not intended to replace advice given to you by  your health care provider. Make sure you discuss any questions you have with your health care provider. Document Released: 06/22/2005 Document Revised: 02/24/2016 Document Reviewed: 08/03/2015 Elsevier Interactive Patient Education  2018 Elsevier Inc.   

## 2021-07-18 LAB — CULTURE, URINE COMPREHENSIVE

## 2021-08-12 DIAGNOSIS — Z872 Personal history of diseases of the skin and subcutaneous tissue: Secondary | ICD-10-CM | POA: Diagnosis not present

## 2021-08-12 DIAGNOSIS — D225 Melanocytic nevi of trunk: Secondary | ICD-10-CM | POA: Diagnosis not present

## 2021-08-12 DIAGNOSIS — L218 Other seborrheic dermatitis: Secondary | ICD-10-CM | POA: Diagnosis not present

## 2021-08-12 DIAGNOSIS — L80 Vitiligo: Secondary | ICD-10-CM | POA: Diagnosis not present

## 2021-08-22 DIAGNOSIS — U071 COVID-19: Secondary | ICD-10-CM | POA: Diagnosis not present

## 2021-09-06 ENCOUNTER — Other Ambulatory Visit: Payer: Self-pay

## 2021-09-06 ENCOUNTER — Telehealth: Payer: Self-pay | Admitting: Internal Medicine

## 2021-09-06 MED ORDER — LINACLOTIDE 72 MCG PO CAPS: 72.0000 ug | ORAL_CAPSULE | Freq: Every day | ORAL | 3 refills | Status: DC

## 2021-09-06 NOTE — Telephone Encounter (Signed)
Faxed in today. 

## 2021-09-06 NOTE — Telephone Encounter (Signed)
1.Medication Requested: linaclotide (LINZESS) 72 MCG capsule  2. Pharmacy (Name, [address], [place]): [place], [place]  Phone:  [phone] Fax:  [phone]   3. On Med List: yes  4. Last Visit with PCP: 10.21.22  5. Next visit date with PCP: 04.19.23   Agent: Please be advised that RX refills may take up to 3 business days. We ask that you follow-up with your pharmacy.

## 2021-10-26 NOTE — Progress Notes (Signed)
Subjective:    Patient ID: Elizabeth Wilkins, female    DOB: [birth year], 76 y.o.   MRN: [id number]  This visit occurred during the SARS-CoV-2 public health emergency.  Safety protocols were in place, including screening questions prior to the visit, additional usage of staff PPE, and extensive cleaning of exam room while observing appropriate contact time as indicated for disinfecting solutions.    HPI The patient is here for an acute visit.  Right ear clogged - she went for a hearing evaluation and was told her right ear was clogged.  She occasionally has popping in the ear.  She denies pain or discomfort in the ear.  No cold symptoms.   GI  - she needs to take linzess every other day. She started on it 11/2019. She is worried about being dependent on it and the day she takes it she has to stick around the house which impacts her quality of life.       Medications and allergies reviewed with patient and updated if appropriate.  Patient Active Problem List   Diagnosis Date Noted   Right ear impacted cerumen 10/27/2021   Acute cystitis 07/16/2021   Rib pain on right side 01/06/2021   Constipation 12/11/2019   Hypertension 05/08/2019   Bladder spasms 11/27/2017   Depression 11/27/2017   Anxiety 11/22/2017   Closed displaced fracture of right femoral neck ([place]) 56/81/2751   Lichen sclerosus of female genitalia 03/30/2017   Herpes simplex 03/30/2017   Vitamin D deficiency 03/30/2017   Decreased hearing 03/30/2017   Seborrheic dermatitis 05/14/2012   Osteoporosis 11/18/2010   Dyslipidemia 11/09/2010    Current Outpatient Medications on File Prior to Visit  Medication Sig Dispense Refill   acetaminophen (TYLENOL) 500 MG tablet Take 1,000 mg by mouth every 6 (six) hours as needed.     amoxicillin (AMOXIL) 500 MG capsule Take 4 tabs 1 hr prior to dental appt 4 capsule 0   betamethasone valerate ointment (VALISONE) 0.1 % Apply 1 application topically 2 (two) times daily. 30 g 0    chlorhexidine (PERIDEX) 0.12 % solution chlorhexidine gluconate 0.12 % mouthwash  RINSE MOUTH WITH 1 CAPFUL FOR 30 SEC AM AND PM AFTER TOOTHBRUSHING SPIT AFTER RINSING DO NOT SWALLOW     clobetasol (TEMOVATE) 0.05 % external solution clobetasol 0.05 % scalp solution  APPLY TWICE A DAY TO SCALP AS NEEDED     linaclotide (LINZESS) 72 MCG capsule Take 1 capsule (72 mcg total) by mouth daily before breakfast. 90 capsule 3   PARoxetine (PAXIL) 10 MG tablet Take 1 tablet (10 mg total) by mouth daily. 90 tablet 1   No current facility-administered medications on file prior to visit.    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   Procedure: TOTAL HIP ARTHROPLASTY ANTERIOR APPROACH AND ABDUCTOR TENDON REPAIR;  Surgeon: Rod Can, MD;  Location: [facility];  Service: Orthopedics;  Laterality: Right;   TUBAL LIGATION      Social History   Socioeconomic History   Marital status: Married    Spouse name: Not on file   Number of children: 3   Years of education: Not on file   Highest education level: Not on file  Occupational History   Occupation: retired  Tobacco Use   Smoking status: Never   Smokeless tobacco: Never  Vaping Use   Vaping Use: Never used  Substance and Sexual Activity   Alcohol use: Yes    Alcohol/week: 14.0 standard drinks    Types: 7 Shots of liquor, 7 Glasses of wine per week   Drug use: No   Sexual activity: Not on file  Other Topics Concern   Not on file  Social History Narrative   Pt moved from [place] in 2009 and 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      No regular exercise   Social Determinants of Health   Financial Resource Strain: Not on file   Food Insecurity: Not on file  Transportation Needs: Not on file  Physical Activity: Not on file  Stress: Not on file  Social Connections: Not on file    Family History  Problem Relation Age of Onset   Pancreatic cancer Mother 42   Hypertension Mother    Hyperlipidemia Mother    Rectal cancer Brother 71   Colon cancer Brother 58   Lupus Father 21   Stomach cancer Neg Hx    Colon polyps Neg Hx    Esophageal cancer Neg Hx     Review of Systems  Constitutional:  Negative for fever.  HENT:  Positive for hearing loss. Negative for congestion, ear pain, sinus pain and sore throat.   Respiratory:  Negative for cough.   Gastrointestinal:  Positive for constipation. Negative for abdominal pain.  Neurological:  Negative for headaches.      Objective:   Vitals:   10/27/21 1404  BP: 132/84  Pulse: 78  Temp: 98.8 F (37.1 C)  SpO2: 98%   BP Readings from Last 3 Encounters:  10/27/21 132/84  07/16/21 138/80  01/06/21 140/78   Wt Readings from Last 3 Encounters:  10/27/21 179 lb 3.2 oz (81.3 kg)  07/16/21 180 lb (81.6 kg)  01/06/21 190 lb (86.2 kg)   Body mass index is 25.71 kg/m.   Physical Exam Constitutional:      General: She is not in acute distress.    Appearance: Normal appearance. She is not ill-appearing.  HENT:     Head: Normocephalic and atraumatic.     Right Ear: Ear canal and external ear normal. There is impacted cerumen.     Left Ear: Tympanic membrane, ear canal and external ear normal. There is no impacted cerumen.  Musculoskeletal:     Cervical back: Neck supple. No tenderness.  Lymphadenopathy:     Cervical: No cervical adenopathy.  Skin:    General: Skin is warm and dry.  Neurological:     Mental Status: She is alert.           Assessment & Plan:    See Problem List for Assessment and Plan of chronic medical problems.

## 2021-10-27 ENCOUNTER — Other Ambulatory Visit: Payer: Self-pay

## 2021-10-27 ENCOUNTER — Encounter: Payer: Self-pay | Admitting: Internal Medicine

## 2021-10-27 ENCOUNTER — Ambulatory Visit (INDEPENDENT_AMBULATORY_CARE_PROVIDER_SITE_OTHER): Payer: Medicare Other | Admitting: Internal Medicine

## 2021-10-27 VITALS — BP 132/84 | HR 78 | Temp 98.8°F | Ht 70.0 in | Wt 179.2 lb

## 2021-10-27 DIAGNOSIS — I10 Essential (primary) hypertension: Secondary | ICD-10-CM | POA: Diagnosis not present

## 2021-10-27 DIAGNOSIS — K5900 Constipation, unspecified: Secondary | ICD-10-CM | POA: Diagnosis not present

## 2021-10-27 DIAGNOSIS — H6121 Impacted cerumen, right ear: Secondary | ICD-10-CM | POA: Diagnosis not present

## 2021-10-27 DIAGNOSIS — F419 Anxiety disorder, unspecified: Secondary | ICD-10-CM

## 2021-10-27 NOTE — Assessment & Plan Note (Addendum)
Acute Right ear canal obstructed with cerumen  Intermittent clogging, but no pain No cold symptoms Decreased hearing Ear successfully cleaned out by CMA-symptoms improved

## 2021-10-27 NOTE — Assessment & Plan Note (Addendum)
Chronic Has been on linzess since 11/2019 She is concerned that she is dependent on it.  He wonders about the possibility of not being able to get medication at some point with medication shortages.  She also wonders if there is any long-term risk of being on it.  She also finds that when she takes it she needs to stick around the house and that is affecting her ability to go out and do things She will try to slowly taper herself off-currently taking every other day and will slowly increase that Start MiraLAX daily High-fiber diet Continue increased water intake May need to try the prune juice again or stool softener in addition to above She would like to see GI-referral ordered

## 2021-10-27 NOTE — Assessment & Plan Note (Signed)
Chronic Controlled, Stable Continue Paxil 10 mg daily

## 2021-10-27 NOTE — Assessment & Plan Note (Signed)
Chronic Blood pressure well controlled Controlled with lifestyle

## 2021-10-27 NOTE — Patient Instructions (Addendum)
° ° °  Your right ear was cleaned out.    Try tapering off the Linzess.  Try taking miralax daily.  Continue increased water intake and a high fiber diet.    A referral was ordered for GI.       Someone from their office will call you to schedule an appointment.

## 2021-11-03 ENCOUNTER — Ambulatory Visit (INDEPENDENT_AMBULATORY_CARE_PROVIDER_SITE_OTHER): Payer: Medicare Other | Admitting: Internal Medicine

## 2021-11-03 ENCOUNTER — Other Ambulatory Visit: Payer: Self-pay

## 2021-11-03 ENCOUNTER — Encounter: Payer: Self-pay | Admitting: Internal Medicine

## 2021-11-03 VITALS — BP 122/80 | HR 54 | Temp 98.0°F | Ht 70.0 in | Wt 181.0 lb

## 2021-11-03 DIAGNOSIS — F419 Anxiety disorder, unspecified: Secondary | ICD-10-CM | POA: Diagnosis not present

## 2021-11-03 DIAGNOSIS — E559 Vitamin D deficiency, unspecified: Secondary | ICD-10-CM | POA: Diagnosis not present

## 2021-11-03 DIAGNOSIS — R300 Dysuria: Secondary | ICD-10-CM | POA: Diagnosis not present

## 2021-11-03 DIAGNOSIS — I10 Essential (primary) hypertension: Secondary | ICD-10-CM | POA: Diagnosis not present

## 2021-11-03 LAB — URINALYSIS, ROUTINE W REFLEX MICROSCOPIC
Bilirubin Urine: NEGATIVE
Ketones, ur: NEGATIVE
Nitrite: NEGATIVE
Specific Gravity, Urine: 1.025 (ref 1.000–1.030)
Total Protein, Urine: NEGATIVE
Urine Glucose: NEGATIVE
Urobilinogen, UA: 0.2 (ref 0.0–1.0)
pH: 5.5 (ref 5.0–8.0)

## 2021-11-03 MED ORDER — CIPROFLOXACIN HCL 500 MG PO TABS: 500.0000 mg | ORAL_TABLET | Freq: Two times a day (BID) | ORAL | 0 refills | Status: AC

## 2021-11-03 NOTE — Assessment & Plan Note (Signed)
Overall stable, situationally worse today, declines need for change in tx,  to f/u any worsening symptoms or concerns

## 2021-11-03 NOTE — Assessment & Plan Note (Signed)
Mild to mod, for urine cx, and empirix cipro antibx course,  to f/u any worsening symptoms or concerns

## 2021-11-03 NOTE — Progress Notes (Signed)
Patient ID: Elizabeth Wilkins, female   DOB: 1945-10-18, 76 y.o.   MRN: [id number]        Chief Complaint: follow up dysuria       HPI:  Elizabeth Wilkins is a 76 y.o. female here with c/o 2-3 days onset dysuria and frequency but Denies urinary symptoms such as urgency, flank pain, hematuria or n/v, fever, chills.  Pt denies chest pain, increased sob or doe, wheezing, orthopnea, PND, increased LE swelling, palpitations, dizziness or syncope.   Pt denies polydipsia, polyuria, or new focal neuro s/s.  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 She has never used smokeless tobacco. She reports current alcohol use of about 14.0 standard drinks per week. She reports that she does not use drugs. family history includes Colon cancer (age of onset: 26) in her brother; Hyperlipidemia in her mother; Hypertension in her mother; Lupus (age of onset: 41) in her father; Pancreatic cancer (age of onset: 79) in her mother; Rectal cancer (age of onset: 41) in her brother. Allergies  Allergen Reactions   Clonazepam     Slurred words, drowsy   Fosamax [Alendronate]     Caused significant weakness   Morphine Itching   Current Outpatient Medications on  File Prior to Visit  Medication Sig Dispense Refill   acetaminophen (TYLENOL) 500 MG tablet Take 1,000 mg by mouth every 6 (six) hours as needed.     amoxicillin (AMOXIL) 500 MG capsule Take 4 tabs 1 hr prior to dental appt 4 capsule 0   betamethasone valerate ointment (VALISONE) 0.1 % Apply 1 application topically 2 (two) times daily. 30 g 0   chlorhexidine (PERIDEX) 0.12 % solution chlorhexidine gluconate 0.12 % mouthwash  RINSE MOUTH WITH 1 CAPFUL FOR 30 SEC AM AND PM AFTER TOOTHBRUSHING SPIT AFTER RINSING DO NOT SWALLOW     clobetasol (TEMOVATE) 0.05 % external solution clobetasol 0.05 % scalp solution  APPLY TWICE A DAY TO SCALP AS NEEDED     linaclotide (LINZESS) 72 MCG capsule Take 1 capsule (72 mcg total) by mouth daily before breakfast. 90 capsule 3   PARoxetine (PAXIL) 10 MG tablet Take 1 tablet (10 mg total) by mouth daily. 90 tablet 1   No current facility-administered medications on file prior to visit.        ROS:  All others reviewed and negative.  Objective        PE:  BP 122/80 (BP Location: Right Arm, Patient Position: Sitting, Cuff Size: Normal)    Pulse (!) 54    Temp 98 F (36.7 C) (Oral)    Ht 5\' 10"  (  1.778 m)    Wt 181 lb (82.1 kg)    SpO2 99%    BMI 25.97 kg/m                 Constitutional: Pt appears in NAD               HENT: Head: NCAT.                Right Ear: External ear normal.                 Left Ear: External ear normal.                Eyes: . Pupils are equal, round, and reactive to light. Conjunctivae and EOM are normal               Nose: without d/c or deformity               Neck: Neck supple. Gross normal ROM               Cardiovascular: Normal rate and regular rhythm.                 Pulmonary/Chest: Effort normal and breath sounds without rales or wheezing.                Abd:  Soft, mild tender low mid abd, ND, + BS, no organomegaly, no flank tender               Neurological: Pt is alert. At baseline orientation, motor grossly intact                Skin: Skin is warm. No rashes, no other new lesions, LE edema - none               Psychiatric: Pt behavior is normal without agitation , 1-2+ nerovus  Micro: none  Cardiac tracings I have personally interpreted today:  none  Pertinent Radiological findings (summarize): none   Lab Results  Component Value Date   WBC 3.7 (L) 01/06/2021   HGB 14.3 01/06/2021   HCT 42.8 01/06/2021   PLT 239.0 01/06/2021   GLUCOSE 80 01/06/2021   CHOL 213 (H) 01/06/2021   TRIG 167.0 (H) 01/06/2021   HDL 59.70 01/06/2021   LDLDIRECT 140.3 07/24/2013   LDLCALC 120 (H) 01/06/2021   ALT 15 01/06/2021   AST 17 01/06/2021   NA 139 01/06/2021   K 4.5 01/06/2021   CL 103 01/06/2021   CREATININE 0.79 01/06/2021   BUN 19 01/06/2021   CO2 28 01/06/2021   TSH 3.19 12/11/2019   HGBA1C 5.0 12/11/2019   Assessment/Plan:  Elizabeth Wilkins is a 76 y.o. White or Caucasian [1] female with  has a past medical history of Heart murmur, Hyperlipidemia, Hypertension, and Osteopenia.  Vitamin D deficiency Last vitamin D Lab Results  Component Value Date   VD25OH 26.06 (L) 01/06/2021   Low, reminded to oral replacement   Hypertension BP Readings from Last 3 Encounters:  11/03/21 122/80  10/27/21 132/84  07/16/21 138/80   Stable, pt to continue medical treatment  - diet, wt control, low salt   Anxiety Overall stable, situationally worse today, declines need for change in tx,  to f/u any worsening symptoms or concerns  Dysuria Mild to mod, for urine cx, and empirix cipro antibx course,  to f/u any worsening symptoms or concerns  Followup: Return if symptoms worsen or fail to improve.  Cathlean Cower, MD  11/03/2021 1:09 PM [facility]

## 2021-11-03 NOTE — Patient Instructions (Signed)
Please take all new medication as prescribed - the antibiotic  Please continue all other medications as before, and refills have been done if requested.  Please have the pharmacy call with any other refills you may need.  Please keep your appointments with your specialists as you may have planned  Please go to the LAB at the blood drawing area for the tests to be done - just the urine testing today  You will be contacted by phone if any changes need to be made immediately.  Otherwise, you will receive a letter about your results with an explanation, but please check with MyChart first.  Please remember to sign up for MyChart if you have not done so, as this will be important to you in the future with finding out test results, communicating by private email, and scheduling acute appointments online when needed.   

## 2021-11-03 NOTE — Assessment & Plan Note (Signed)
BP Readings from Last 3 Encounters:  11/03/21 122/80  10/27/21 132/84  07/16/21 138/80   Stable, pt to continue medical treatment  - diet, wt control, low salt

## 2021-11-03 NOTE — Assessment & Plan Note (Signed)
Last vitamin D Lab Results  Component Value Date   VD25OH 26.06 (L) 01/06/2021   Low, reminded to oral replacement

## 2021-11-05 LAB — URINE CULTURE

## 2021-11-24 DIAGNOSIS — M19071 Primary osteoarthritis, right ankle and foot: Secondary | ICD-10-CM | POA: Diagnosis not present

## 2021-11-24 DIAGNOSIS — M67961 Unspecified disorder of synovium and tendon, right lower leg: Secondary | ICD-10-CM | POA: Diagnosis not present

## 2021-11-24 DIAGNOSIS — M79671 Pain in right foot: Secondary | ICD-10-CM | POA: Diagnosis not present

## 2021-11-28 ENCOUNTER — Encounter: Payer: Self-pay | Admitting: Internal Medicine

## 2021-12-02 NOTE — Telephone Encounter (Signed)
Pt calling to schedule shingle vac, informed pt due to her having medicare if vac is administered at the office of her pcp it will be expensive, advised pt to go to her local pharmacy  ? ?Pt states she spoke with medicare and was informed it was covered ? ?Advised pt I can schedule the appt but she will be responsible for any out of pocket cost, pt then disconnected the call ?

## 2021-12-02 NOTE — Telephone Encounter (Signed)
Pt c/b stating her supplemental insurance will cover shingle vac ? ?Pt next to receive vac on 3-10, pt is aware of possible out of pocket cost ? ?

## 2021-12-03 ENCOUNTER — Ambulatory Visit: Payer: Medicare Other

## 2021-12-03 ENCOUNTER — Other Ambulatory Visit: Payer: Self-pay

## 2021-12-03 ENCOUNTER — Telehealth: Payer: Self-pay

## 2021-12-03 NOTE — Telephone Encounter (Signed)
Erro

## 2021-12-03 NOTE — Telephone Encounter (Signed)
Error

## 2021-12-09 DIAGNOSIS — M546 Pain in thoracic spine: Secondary | ICD-10-CM | POA: Diagnosis not present

## 2021-12-20 ENCOUNTER — Other Ambulatory Visit: Payer: Self-pay | Admitting: Internal Medicine

## 2021-12-21 ENCOUNTER — Other Ambulatory Visit: Payer: Self-pay

## 2021-12-21 DIAGNOSIS — M810 Age-related osteoporosis without current pathological fracture: Secondary | ICD-10-CM

## 2021-12-21 NOTE — Progress Notes (Signed)
error 

## 2021-12-23 ENCOUNTER — Other Ambulatory Visit: Payer: Self-pay

## 2021-12-23 DIAGNOSIS — M810 Age-related osteoporosis without current pathological fracture: Secondary | ICD-10-CM

## 2021-12-24 ENCOUNTER — Other Ambulatory Visit: Payer: Self-pay | Admitting: Internal Medicine

## 2021-12-24 DIAGNOSIS — Z1231 Encounter for screening mammogram for malignant neoplasm of breast: Secondary | ICD-10-CM

## 2021-12-28 DIAGNOSIS — M546 Pain in thoracic spine: Secondary | ICD-10-CM | POA: Diagnosis not present

## 2021-12-28 DIAGNOSIS — M5451 Vertebrogenic low back pain: Secondary | ICD-10-CM | POA: Diagnosis not present

## 2021-12-29 ENCOUNTER — Encounter: Payer: Self-pay | Admitting: Internal Medicine

## 2021-12-29 NOTE — Patient Instructions (Addendum)
? ? ?  Have blood work done  ? ? ?Schedule bone density ? ? ?Continue calcium 600 mg twice daily, vitamin d 2000 units daily, vitamin K 45 mg daily ? ? ? ? ? ?Return in about 6 months (around 07/01/2022) for follow up.  Sooner if needed ? ? ?We will look into cost of Evenity for your bones.   ? ? ? ?

## 2021-12-29 NOTE — Progress Notes (Signed)
? ? ? ? ?Subjective:  ? ? Patient ID: Elizabeth Wilkins, female    DOB: Nov 30, 1945, 76 y.o.   MRN: [id number] ? ?This visit occurred during the SARS-CoV-2 public health emergency.  Safety protocols were in place, including screening questions prior to the visit, additional usage of staff PPE, and extensive cleaning of exam room while observing appropriate contact time as indicated for disinfecting solutions.   ? ? ?HPI ?Elizabeth Wilkins is here for follow up of her chronic medical problems, including anxiety, OP, dyslipidemia, vitamin D deficiency ? ?Has been having back pain-seeing Dr. Tonita Cong - no fx. she currently is not having the pain.  She will be starting physical therapy.  She does have upper back/neck tension because she is anxious and stressed about her osteoporosis and concern of breaking something.  She is afraid to do things because she is afraid she may break the bone.  She does want to start medication for her bones. ? ?She is taking Paxil 10 mg daily for her anxiety-she is still having significant anxiety. ? ?Medications and allergies reviewed with patient and updated if appropriate. ? ?Current Outpatient Medications on File Prior to Visit  ?Medication Sig Dispense Refill  ? acetaminophen (TYLENOL) 500 MG tablet Take 1,000 mg by mouth every 6 (six) hours as needed.    ? amoxicillin (AMOXIL) 500 MG capsule Take 4 tabs 1 hr prior to dental appt 4 capsule 0  ? betamethasone valerate ointment (VALISONE) 0.1 % Apply 1 application topically 2 (two) times daily. 30 g 0  ? chlorhexidine (PERIDEX) 0.12 % solution chlorhexidine gluconate 0.12 % mouthwash ? RINSE MOUTH WITH 1 CAPFUL FOR 30 SEC AM AND PM AFTER TOOTHBRUSHING SPIT AFTER RINSING DO NOT SWALLOW    ? clobetasol (TEMOVATE) 0.05 % external solution clobetasol 0.05 % scalp solution ? APPLY TWICE A DAY TO SCALP AS NEEDED    ? linaclotide (LINZESS) 72 MCG capsule Take 1 capsule (72 mcg total) by mouth daily before breakfast. 90 capsule 3  ? meloxicam (MOBIC) 7.5 MG tablet  Take 7.5 mg by mouth daily.    ? PARoxetine (PAXIL) 10 MG tablet TAKE 1 TABLET BY MOUTH EVERY DAY 30 tablet 5  ? ?No current facility-administered medications on file prior to visit.  ? ? ? ?Review of Systems  ?Constitutional:  Negative for fever.  ?Respiratory:  Negative for shortness of breath and wheezing.   ?Cardiovascular:  Negative for chest pain, palpitations and leg swelling.  ?Neurological:  Negative for light-headedness and headaches.  ?Psychiatric/Behavioral:  Negative for sleep disturbance. The patient is nervous/anxious.   ? ?   ?Objective:  ? ?Vitals:  ? 12/30/21 0939  ?BP: 124/82  ?Pulse: 63  ?Temp: 98.3 ?F (36.8 ?C)  ?SpO2: 99%  ? ?BP Readings from Last 3 Encounters:  ?12/30/21 124/82  ?11/03/21 122/80  ?10/27/21 132/84  ? ?Wt Readings from Last 3 Encounters:  ?12/30/21 170 lb 9.6 oz (77.4 kg)  ?11/03/21 181 lb (82.1 kg)  ?10/27/21 179 lb 3.2 oz (81.3 kg)  ? ?Body mass index is 24.48 kg/m?. ? ?  ?Physical Exam ?Constitutional:   ?   General: She is not in acute distress. ?   Appearance: Normal appearance.  ?HENT:  ?   Head: Normocephalic and atraumatic.  ?Eyes:  ?   Conjunctiva/sclera: Conjunctivae normal.  ?Cardiovascular:  ?   Rate and Rhythm: Normal rate and regular rhythm.  ?   Heart sounds: Normal heart sounds. No murmur heard. ?Pulmonary:  ?   Effort: Pulmonary  effort is normal. No respiratory distress.  ?   Breath sounds: Normal breath sounds. No wheezing.  ?Musculoskeletal:  ?   Cervical back: Neck supple.  ?   Right lower leg: No edema.  ?   Left lower leg: No edema.  ?Lymphadenopathy:  ?   Cervical: No cervical adenopathy.  ?Skin: ?   Findings: No rash.  ?Neurological:  ?   Mental Status: She is alert. Mental status is at baseline.  ?Psychiatric:     ?   Mood and Affect: Mood normal.     ?   Behavior: Behavior normal.  ? ?   ? ?Lab Results  ?Component Value Date  ? WBC 3.7 (L) 01/06/2021  ? HGB 14.3 01/06/2021  ? HCT 42.8 01/06/2021  ? PLT 239.0 01/06/2021  ? GLUCOSE 80 01/06/2021  ? CHOL  213 (H) 01/06/2021  ? TRIG 167.0 (H) 01/06/2021  ? HDL 59.70 01/06/2021  ? LDLDIRECT 140.3 07/24/2013  ? LDLCALC 120 (H) 01/06/2021  ? ALT 15 01/06/2021  ? AST 17 01/06/2021  ? NA 139 01/06/2021  ? K 4.5 01/06/2021  ? CL 103 01/06/2021  ? CREATININE 0.79 01/06/2021  ? BUN 19 01/06/2021  ? CO2 28 01/06/2021  ? TSH 3.19 12/11/2019  ? HGBA1C 5.0 12/11/2019  ? ? ? ?Assessment & Plan:  ? ? ?See Problem List for Assessment and Plan of chronic medical problems.  ? ? ?

## 2021-12-30 ENCOUNTER — Ambulatory Visit: Payer: Medicare Other | Admitting: Internal Medicine

## 2021-12-30 ENCOUNTER — Ambulatory Visit (INDEPENDENT_AMBULATORY_CARE_PROVIDER_SITE_OTHER): Payer: Medicare Other | Admitting: Internal Medicine

## 2021-12-30 VITALS — BP 124/82 | HR 63 | Temp 98.3°F | Ht 70.0 in | Wt 170.6 lb

## 2021-12-30 DIAGNOSIS — E871 Hypo-osmolality and hyponatremia: Secondary | ICD-10-CM

## 2021-12-30 DIAGNOSIS — M810 Age-related osteoporosis without current pathological fracture: Secondary | ICD-10-CM

## 2021-12-30 DIAGNOSIS — E785 Hyperlipidemia, unspecified: Secondary | ICD-10-CM | POA: Diagnosis not present

## 2021-12-30 DIAGNOSIS — E559 Vitamin D deficiency, unspecified: Secondary | ICD-10-CM

## 2021-12-30 DIAGNOSIS — F419 Anxiety disorder, unspecified: Secondary | ICD-10-CM | POA: Diagnosis not present

## 2021-12-30 LAB — CBC WITH DIFFERENTIAL/PLATELET
Basophils Absolute: 0 10*3/uL (ref 0.0–0.1)
Basophils Relative: 0.1 % (ref 0.0–3.0)
Eosinophils Absolute: 0 10*3/uL (ref 0.0–0.7)
Eosinophils Relative: 0 % (ref 0.0–5.0)
HCT: 42.1 % (ref 36.0–46.0)
Hemoglobin: 14.5 g/dL (ref 12.0–15.0)
Lymphocytes Relative: 22.4 % (ref 12.0–46.0)
Lymphs Abs: 0.8 10*3/uL (ref 0.7–4.0)
MCHC: 34.5 g/dL (ref 30.0–36.0)
MCV: 93 fl (ref 78.0–100.0)
Monocytes Absolute: 0.3 10*3/uL (ref 0.1–1.0)
Monocytes Relative: 10.2 % (ref 3.0–12.0)
Neutro Abs: 2.3 10*3/uL (ref 1.4–7.7)
Neutrophils Relative %: 67.3 % (ref 43.0–77.0)
Platelets: 253 10*3/uL (ref 150.0–400.0)
RBC: 4.53 Mil/uL (ref 3.87–5.11)
RDW: 12.9 % (ref 11.5–15.5)
WBC: 3.4 10*3/uL — ABNORMAL LOW (ref 4.0–10.5)

## 2021-12-30 LAB — COMPREHENSIVE METABOLIC PANEL
ALT: 15 U/L (ref 0–35)
AST: 23 U/L (ref 0–37)
Albumin: 4.7 g/dL (ref 3.5–5.2)
Alkaline Phosphatase: 77 U/L (ref 39–117)
BUN: 10 mg/dL (ref 6–23)
CO2: 27 mEq/L (ref 19–32)
Calcium: 9.9 mg/dL (ref 8.4–10.5)
Chloride: 92 mEq/L — ABNORMAL LOW (ref 96–112)
Creatinine, Ser: 0.81 mg/dL (ref 0.40–1.20)
GFR: 70.37 mL/min (ref >60.00)
Glucose, Bld: 96 mg/dL (ref 70–99)
Potassium: 5.1 mEq/L (ref 3.5–5.1)
Sodium: 127 mEq/L — ABNORMAL LOW (ref 135–145)
Total Bilirubin: 0.9 mg/dL (ref 0.2–1.2)
Total Protein: 7 g/dL (ref 6.0–8.3)

## 2021-12-30 LAB — VITAMIN D 25 HYDROXY (VIT D DEFICIENCY, FRACTURES): VITD: 28.37 ng/mL — ABNORMAL LOW (ref 30.00–100.00)

## 2021-12-30 LAB — LIPID PANEL
Cholesterol: 192 mg/dL (ref 0–200)
HDL: 76.9 mg/dL (ref >39.00)
LDL Cholesterol: 100 mg/dL — ABNORMAL HIGH (ref 0–99)
NonHDL: 114.79
Total CHOL/HDL Ratio: 2
Triglycerides: 72 mg/dL (ref 0.0–149.0)
VLDL: 14.4 mg/dL (ref 0.0–40.0)

## 2021-12-30 LAB — TSH: TSH: 1.81 u[IU]/mL (ref 0.35–5.50)

## 2021-12-30 MED ORDER — PAROXETINE HCL 20 MG PO TABS
20.0000 mg | ORAL_TABLET | Freq: Every day | ORAL | 3 refills | Status: DC
Start: 2021-12-30 — End: 2022-07-11

## 2021-12-30 NOTE — Assessment & Plan Note (Signed)
Chronic Taking vitamin D daily Check vitamin D level  

## 2021-12-30 NOTE — Assessment & Plan Note (Addendum)
Chronic ?Not ideally controlled ?Has been on and off Paxil over the years ?Restarted Paxil 10 mg daily 3 weeks ago ?Still having significant anxiety ?Increase Paxil to 20 mg daily ? ?

## 2021-12-30 NOTE — Assessment & Plan Note (Signed)
Chronic ?Regular exercise and healthy diet encouraged ?Check lipid panel, CMP ?Currently lifestyle controlled ?

## 2021-12-30 NOTE — Assessment & Plan Note (Addendum)
Chronic ?DEXA ordered - will schedule ?Did not tolerate Fosamax - got weak, could not walk ?Discussed Prolia, Evenity-we both agree Evenity would be the best choice if covered.  After that we would switch to Prolia ?Information given to her for both Prolia and Evenity for her to review ?Will check with insurance about coverage ?Check vitamin D level, TSH, PTH, CMP, CBC ?To start physical therapy-stressed regular exercise ?Reviewed last bone density ?

## 2021-12-31 DIAGNOSIS — M546 Pain in thoracic spine: Secondary | ICD-10-CM | POA: Diagnosis not present

## 2022-01-01 LAB — PTH, INTACT AND CALCIUM
Calcium: 9.7 mg/dL (ref 8.6–10.4)
PTH: 32 pg/mL (ref 16–77)

## 2022-01-02 NOTE — Addendum Note (Signed)
Addended by: Binnie Rail on: 01/02/2022 10:35 AM ? ? Modules accepted: Orders ? ?

## 2022-01-03 ENCOUNTER — Encounter: Payer: Self-pay | Admitting: Internal Medicine

## 2022-01-03 ENCOUNTER — Ambulatory Visit: Payer: Medicare Other

## 2022-01-03 DIAGNOSIS — M810 Age-related osteoporosis without current pathological fracture: Secondary | ICD-10-CM

## 2022-01-05 ENCOUNTER — Telehealth: Payer: Self-pay

## 2022-01-05 NOTE — Telephone Encounter (Signed)
Prolia VOB initiated via [email or web address] ? ?New start ? ?

## 2022-01-07 ENCOUNTER — Telehealth: Payer: Self-pay

## 2022-01-07 NOTE — Telephone Encounter (Signed)
-----   Message from Binnie Rail, MD sent at 12/30/2021  5:25 PM EDT ----- ?She has osteoporosis.  She has had a previous fracture of her right hip which has been replaced.  She was on Fosamax years ago and did not tolerate it.  I would ideally like to get her on Evenity if covered. ? ? ? ?SB ? ?

## 2022-01-10 ENCOUNTER — Other Ambulatory Visit (INDEPENDENT_AMBULATORY_CARE_PROVIDER_SITE_OTHER): Payer: Medicare Other

## 2022-01-10 DIAGNOSIS — E871 Hypo-osmolality and hyponatremia: Secondary | ICD-10-CM | POA: Diagnosis not present

## 2022-01-10 DIAGNOSIS — M546 Pain in thoracic spine: Secondary | ICD-10-CM | POA: Diagnosis not present

## 2022-01-10 LAB — BASIC METABOLIC PANEL
BUN: 18 mg/dL (ref 6–23)
CO2: 26 mEq/L (ref 19–32)
Calcium: 9.4 mg/dL (ref 8.4–10.5)
Chloride: 98 mEq/L (ref 96–112)
Creatinine, Ser: 0.99 mg/dL (ref 0.40–1.20)
GFR: 55.3 mL/min — ABNORMAL LOW (ref >60.00)
Glucose, Bld: 108 mg/dL — ABNORMAL HIGH (ref 70–99)
Potassium: 4.7 mEq/L (ref 3.5–5.1)
Sodium: 132 mEq/L — ABNORMAL LOW (ref 135–145)

## 2022-01-11 ENCOUNTER — Encounter: Payer: Self-pay | Admitting: Internal Medicine

## 2022-01-11 NOTE — Telephone Encounter (Signed)
Evenity ? ?New start ?

## 2022-01-12 ENCOUNTER — Ambulatory Visit: Payer: Medicare Other | Admitting: Internal Medicine

## 2022-01-12 ENCOUNTER — Ambulatory Visit: Payer: Medicare Other

## 2022-01-15 NOTE — Telephone Encounter (Signed)
Pt ready for scheduling on or after 01/15/22 ? ?Out-of-pocket cost due at time of visit: $0 ? ?Primary: Medicare ?Prolia co-insurance: 20% (approximately $276) ?Admin fee co-insurance: 20% (approximately $25) ? ?Secondary: [employer] ?Prolia co-insurance: will coordinate benefits and will consider 100% of remaining expenses ?Admin fee co-insurance: will coordinate benefits and will consider 100% of remaining expenses ? ?Deductible: $226 of $226 met / does not apply ? ?Prior Auth: not required ?PA# ?Valid:  ? ?** This summary of benefits is an estimation of the patient's out-of-pocket cost. Exact cost may vary based on individual plan coverage.  ? ?

## 2022-01-17 ENCOUNTER — Ambulatory Visit: Payer: Medicare Other | Admitting: Internal Medicine

## 2022-01-17 ENCOUNTER — Ambulatory Visit: Payer: Medicare Other

## 2022-01-17 ENCOUNTER — Inpatient Hospital Stay: Admission: RE | Admit: 2022-01-17 | Payer: Medicare Other | Source: Ambulatory Visit

## 2022-01-25 ENCOUNTER — Encounter: Payer: Self-pay | Admitting: Internal Medicine

## 2022-01-31 ENCOUNTER — Other Ambulatory Visit: Payer: Self-pay | Admitting: Internal Medicine

## 2022-01-31 DIAGNOSIS — M810 Age-related osteoporosis without current pathological fracture: Secondary | ICD-10-CM

## 2022-02-01 ENCOUNTER — Ambulatory Visit: Payer: Medicare Other

## 2022-02-01 DIAGNOSIS — M546 Pain in thoracic spine: Secondary | ICD-10-CM | POA: Diagnosis not present

## 2022-02-02 ENCOUNTER — Ambulatory Visit
Admission: RE | Admit: 2022-02-02 | Discharge: 2022-02-02 | Disposition: A | Discharge status: Home / Self Care | Payer: Medicare Other | Source: Ambulatory Visit | Attending: Internal Medicine | Admitting: Internal Medicine

## 2022-02-02 DIAGNOSIS — Z1231 Encounter for screening mammogram for malignant neoplasm of breast: Secondary | ICD-10-CM

## 2022-02-03 ENCOUNTER — Ambulatory Visit (INDEPENDENT_AMBULATORY_CARE_PROVIDER_SITE_OTHER)
Admission: RE | Admit: 2022-02-03 | Discharge: 2022-02-03 | Disposition: A | Discharge status: Home / Self Care | Payer: Medicare Other | Source: Ambulatory Visit | Attending: Internal Medicine | Admitting: Internal Medicine

## 2022-02-03 DIAGNOSIS — M810 Age-related osteoporosis without current pathological fracture: Secondary | ICD-10-CM | POA: Diagnosis not present

## 2022-02-04 ENCOUNTER — Encounter: Payer: Self-pay | Admitting: Gastroenterology

## 2022-02-04 ENCOUNTER — Ambulatory Visit (INDEPENDENT_AMBULATORY_CARE_PROVIDER_SITE_OTHER): Payer: Medicare Other | Admitting: Gastroenterology

## 2022-02-04 VITALS — BP 140/80 | HR 61 | Ht 69.0 in | Wt 169.0 lb

## 2022-02-04 DIAGNOSIS — K5904 Chronic idiopathic constipation: Secondary | ICD-10-CM | POA: Diagnosis not present

## 2022-02-04 MED ORDER — LINACLOTIDE 72 MCG PO CAPS: 72.0000 ug | ORAL_CAPSULE | Freq: Every day | ORAL | 3 refills | Status: DC

## 2022-02-04 NOTE — Patient Instructions (Signed)
We have sent the following medications to your pharmacy for you to pick up at your convenience: Linzess. ? ?Start over the counter Benefiber 1 tablespoon twice daily with meals.  ? ?The [facility] providers would like to encourage you to use [facility] to communicate with providers for non-urgent requests or questions.  Due to long hold times on the telephone, sending your provider a message by [facility] may be a faster and more efficient way to get a response.  Please allow 48 business hours for a response.  Please remember that this is for non-urgent requests.  ? ? ?

## 2022-02-04 NOTE — Progress Notes (Signed)
Elizabeth Wilkins    [id number]    [birth year]  Primary Care Physician:Burns, Claudina Lick, MD  Referring Physician: Binnie Rail, MD [place],  [place] [address]   Chief complaint:  Constipation  HPI:  76 year old female with family history of colorectal cancer here for follow-up visit for constipation.  She is currently taking Linzess 72 mcg daily, few days she skips it because of inability to take it 30 minutes before breakfast.  On average she has a bowel movement 4-5 times a week.  Denies any nausea, vomiting, abdominal pain, melena or bright red blood per rectum She recently lost her grandson who was 49 in an accident  Colonoscopy December 31, 2019 - Three 1 to 2 mm polyps [hyperplastic] in the rectum, in the descending colon and in the transverse colon, removed with a cold biopsy forceps. Resected and retrieved. - Diverticulosis in the sigmoid colon. - Non-bleeding internal hemorrhoids.  Colonoscopy in November 2014 by Dr. Carlean Purl with excellent prep with findings of sigmoid diverticulosis and was normal exam otherwise, prior to this she had a normal colonoscopy in 2004.     Outpatient Encounter Medications as of 02/04/2022  Medication Sig   acetaminophen (TYLENOL) 500 MG tablet Take 1,000 mg by mouth every 6 (six) hours as needed.   amoxicillin (AMOXIL) 500 MG capsule Take 4 tabs 1 hr prior to dental appt   betamethasone valerate ointment (VALISONE) 0.1 % Apply 1 application topically 2 (two) times daily.   chlorhexidine (PERIDEX) 0.12 % solution chlorhexidine gluconate 0.12 % mouthwash  RINSE MOUTH WITH 1 CAPFUL FOR 30 SEC AM AND PM AFTER TOOTHBRUSHING SPIT AFTER RINSING DO NOT SWALLOW   clobetasol (TEMOVATE) 0.05 % external solution clobetasol 0.05 % scalp solution  APPLY TWICE A DAY TO SCALP AS NEEDED   linaclotide (LINZESS) 72 MCG capsule Take 1 capsule (72 mcg total) by mouth daily before breakfast.   meloxicam (MOBIC) 7.5 MG tablet Take 7.5 mg by  mouth daily.   PARoxetine (PAXIL) 20 MG tablet Take 1 tablet (20 mg total) by mouth daily.   No facility-administered encounter medications on file as of 02/04/2022.    Allergies as of 02/04/2022 - Review Complete 12/29/2021  Allergen Reaction Noted   Clonazepam  04/16/2019   Fosamax [alendronate]  01/15/2020   Morphine Itching     Past Medical History:  Diagnosis Date   Heart murmur    Hyperlipidemia    Hypertension    Osteopenia     Past Surgical History:  Procedure Laterality Date   AUGMENTATION MAMMAPLASTY     broken wrist Right    plates and screws   COLONOSCOPY  08/02/2013   TOTAL HIP ARTHROPLASTY Right 08/22/2017   Procedure: TOTAL HIP ARTHROPLASTY ANTERIOR APPROACH AND ABDUCTOR TENDON REPAIR;  Surgeon: Rod Can, MD;  Location: [facility];  Service: Orthopedics;  Laterality: Right;   TUBAL LIGATION      Family History  Problem Relation Age of Onset   Pancreatic cancer Mother 108   Hypertension Mother    Hyperlipidemia Mother    Rectal cancer Brother 53   Colon cancer Brother 35   Lupus Father 44   Stomach cancer Neg Hx    Colon polyps Neg Hx    Esophageal cancer Neg Hx     Social History   Socioeconomic History   Marital status: Married    Spouse name: Not on file   Number of children: 3  Years of education: Not on file   Highest education level: Not on file  Occupational History   Occupation: retired  Tobacco Use   Smoking status: Never   Smokeless tobacco: Never  Vaping Use   Vaping Use: Never used  Substance and Sexual Activity   Alcohol use: Yes    Alcohol/week: 14.0 standard drinks    Types: 7 Shots of liquor, 7 Glasses of wine per week   Drug use: No   Sexual activity: Not on file  Other Topics Concern   Not on file  Social History Narrative   Pt moved from [place] in 2009 and 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      No regular exercise   Social  Determinants of Health   Financial Resource Strain: Not on file  Food Insecurity: Not on file  Transportation Needs: Not on file  Physical Activity: Not on file  Stress: Not on file  Social Connections: Not on file  Intimate Partner Violence: Not on file      Review of systems: All other review of systems negative except as mentioned in the HPI.   Physical Exam: Vitals:   02/04/22 0832  BP: 140/80  Pulse: 61   Body mass index is 24.96 kg/m. Gen:      No acute distress HEENT:  sclera anicteric Abd:      soft, non-tender; no palpable masses, no distension Ext:    No edema Neuro: alert and oriented x 3 Psych: normal mood and affect  Data Reviewed:  Reviewed labs, radiology imaging, old records and pertinent past GI work up   Assessment and Plan/Recommendations:  76 year old female with family history of colon cancer here for follow-up visit for chronic idiopathic constipation Discussed dietary modifications, increase fiber intake and water intake Use Linzess 72 mcg daily Add Benefiber 1 tablespoon twice daily with meals  Due for surveillance colonoscopy April 2026  Return in 3 months   The patient was provided an opportunity to ask questions and all were answered. The patient agreed with the plan and demonstrated an understanding of the instructions.  Damaris Hippo , MD    CC: Binnie Rail, MD

## 2022-02-06 ENCOUNTER — Other Ambulatory Visit: Payer: Self-pay

## 2022-02-06 ENCOUNTER — Emergency Department (HOSPITAL_BASED_OUTPATIENT_CLINIC_OR_DEPARTMENT_OTHER)
Admission: EM | Admit: 2022-02-06 | Discharge: 2022-02-06 | Disposition: A | Discharge status: Home / Self Care | Payer: Medicare Other | Attending: Emergency Medicine | Admitting: Emergency Medicine

## 2022-02-06 ENCOUNTER — Encounter (HOSPITAL_BASED_OUTPATIENT_CLINIC_OR_DEPARTMENT_OTHER): Payer: Self-pay | Admitting: Emergency Medicine

## 2022-02-06 ENCOUNTER — Emergency Department (HOSPITAL_BASED_OUTPATIENT_CLINIC_OR_DEPARTMENT_OTHER): Payer: Medicare Other

## 2022-02-06 DIAGNOSIS — W01198A Fall on same level from slipping, tripping and stumbling with subsequent striking against other object, initial encounter: Secondary | ICD-10-CM | POA: Insufficient documentation

## 2022-02-06 DIAGNOSIS — S92344A Nondisplaced fracture of fourth metatarsal bone, right foot, initial encounter for closed fracture: Secondary | ICD-10-CM | POA: Diagnosis not present

## 2022-02-06 DIAGNOSIS — S99921A Unspecified injury of right foot, initial encounter: Secondary | ICD-10-CM | POA: Diagnosis present

## 2022-02-06 DIAGNOSIS — M7989 Other specified soft tissue disorders: Secondary | ICD-10-CM | POA: Diagnosis not present

## 2022-02-06 NOTE — ED Triage Notes (Signed)
Pt fell this am over dishwasher, hit her right wrist/hip and ankle. Right ankle is swollen and bruised.  ?

## 2022-02-06 NOTE — ED Notes (Signed)
Dc instructions reviewed with patient. Patient voiced understanding. Dc with belongings.  °

## 2022-02-06 NOTE — ED Provider Notes (Signed)
?[facility] ?Provider Note ? ? ?CSN: [id number] ?Arrival date & time: 02/06/22  0805 ? ?  ? ?History ? ?No chief complaint on file. ? ? ?Elizabeth Wilkins is a 76 y.o. female. ? ?Patient is a 76 year old female who presents with pain in her right foot.  She tripped over the dishwasher last night around 1030 and injured her right foot.  She said she also has some bruising to her right hand and wrist but denies any current pain in these areas.  She did not hit her head.  No neck or back pain.  She denies any other injuries.  She does have a history of osteoporosis and has had prior extremity fractures. ? ? ?  ? ?Home Medications ?Prior to Admission medications   ?Medication Sig Start Date End Date Taking? Authorizing Provider  ?acetaminophen (TYLENOL) 500 MG tablet Take 1,000 mg by mouth every 6 (six) hours as needed.    [provider]  ?amoxicillin (AMOXIL) 500 MG capsule Take 4 tabs 1 hr prior to dental appt 06/08/21   Binnie Rail, MD  ?betamethasone valerate ointment (VALISONE) 0.1 % Apply 1 application topically 2 (two) times daily. 01/06/21   Binnie Rail, MD  ?chlorhexidine (PERIDEX) 0.12 % solution chlorhexidine gluconate 0.12 % mouthwash ? RINSE MOUTH WITH 1 CAPFUL FOR 30 SEC AM AND PM AFTER TOOTHBRUSHING SPIT AFTER RINSING DO NOT SWALLOW    [provider]  ?clobetasol (TEMOVATE) 0.05 % external solution clobetasol 0.05 % scalp solution ? APPLY TWICE A DAY TO SCALP AS NEEDED    [provider]  ?linaclotide (LINZESS) 72 MCG capsule Take 1 capsule (72 mcg total) by mouth daily before breakfast. 02/04/22   Nandigam, Venia Minks, MD  ?meloxicam (MOBIC) 7.5 MG tablet Take 7.5 mg by mouth daily. 12/28/21   [provider]  ?PARoxetine (PAXIL) 20 MG tablet Take 1 tablet (20 mg total) by mouth daily. 12/30/21   Binnie Rail, MD  ?   ? ?Allergies    ?Clonazepam, Fosamax [alendronate], and Morphine   ? ?Review of Systems   ?Review of Systems  ?Constitutional:   Negative for fever.  ?Gastrointestinal:  Negative for nausea and vomiting.  ?Musculoskeletal:  Positive for arthralgias and joint swelling. Negative for back pain and neck pain.  ?Skin:  Negative for wound.  ?Neurological:  Negative for weakness, numbness and headaches.  ? ?Physical Exam ?Updated Vital Signs ?BP 135/90 (BP Location: Right Arm)   Pulse 68   Temp 98.1 ?F (36.7 ?C) (Oral)   Resp 16   SpO2 100%  ?Physical Exam ?Constitutional:   ?   Appearance: She is well-developed.  ?HENT:  ?   Head: Normocephalic and atraumatic.  ?Neck:  ?   Comments: No pain to the cervical, thoracic or lumbosacral spine ?Cardiovascular:  ?   Rate and Rhythm: Normal rate.  ?Pulmonary:  ?   Effort: Pulmonary effort is normal.  ?Musculoskeletal:     ?   General: Tenderness present.  ?   Cervical back: Normal range of motion and neck supple.  ?   Comments: There is some ecchymosis over the lateral aspect of the right foot.  There is some tenderness over the fourth and fifth metatarsals.  No pain to the ankle.  No pain to the knee.  No pain on range of motion of the hip.  Pedal pulses are intact.  There is no open wounds.  She has normal sensation and motor function distally.  There  is some ecchymosis to the right wrist and right hand but there is no underlying swelling or bony tenderness.  No other pain on palpation or range of motion of the extremities  ?Skin: ?   General: Skin is warm and dry.  ?Neurological:  ?   Mental Status: She is alert and oriented to person, place, and time.  ? ? ?ED Results / Procedures / Treatments   ?Labs ?(all labs ordered are listed, but only abnormal results are displayed) ?Labs Reviewed - No data to display ? ?EKG ?None ? ?Radiology ?DG Foot Complete Right ? ?Result Date: 02/06/2022 ?CLINICAL DATA:  Foot pain after fall last night. EXAM: RIGHT FOOT COMPLETE - 3+ VIEW COMPARISON:  None FINDINGS: The bones appear diffusely osteopenic. There is an acute fracture deformity involving the distal shaft of  the fourth metatarsal bone. There is mild lateral angulation of the distal fracture fragments. Deformity involving the distal shaft of the fifth metatarsal bone is also noted which appears age indeterminate there is soft tissue swelling along the dorsum of the distal metatarsal bones. IMPRESSION: 1. Acute fracture involves the distal shaft of the fourth metatarsal bone. Mild lateral angulation of the distal fracture fragments. 2. Age indeterminate fracture deformity involving the distal shaft of the fifth metatarsal bone. Correlate for any focal tenderness over the distal aspect of the fifth metatarsal bone. 3. Osteopenia. Electronically Signed   By: Kerby Moors M.D.   On: 02/06/2022 08:58   ? ?Procedures ?Procedures  ? ? ?Medications Ordered in ED ?Medications - No data to display ? ?ED Course/ Medical Decision Making/ A&P ?  ?                        ?Medical Decision Making ?Amount and/or Complexity of Data Reviewed ?Radiology: ordered. ? ? ?Patient is a 76 year old who injured her right foot last night after a fall.  She has a nondisplaced fracture of the fourth metatarsal and a questionable acute fracture of the fifth metatarsal.  These were interpreted by me.  She was placed in a cam walker.  She has crutches at home and denies need for new crutches.  She was advised in symptomatic care.  She was instructed to follow-up this week with her orthopedist for definitive management.  Return precautions were given. ? ?Final Clinical Impression(s) / ED Diagnoses ?Final diagnoses:  ?Closed nondisplaced fracture of fourth metatarsal bone of right foot, initial encounter  ? ? ?Rx / DC Orders ?ED Discharge Orders   ? ? None  ? ?  ? ? ?  ?Malvin Johns, MD ?02/06/22 0930 ? ?

## 2022-02-07 DIAGNOSIS — S92344A Nondisplaced fracture of fourth metatarsal bone, right foot, initial encounter for closed fracture: Secondary | ICD-10-CM | POA: Diagnosis not present

## 2022-02-07 DIAGNOSIS — M79671 Pain in right foot: Secondary | ICD-10-CM | POA: Diagnosis not present

## 2022-02-07 DIAGNOSIS — S92354A Nondisplaced fracture of fifth metatarsal bone, right foot, initial encounter for closed fracture: Secondary | ICD-10-CM | POA: Diagnosis not present

## 2022-02-08 NOTE — Telephone Encounter (Signed)
Will defer to Endocrinology ? ?Per pt message: ? ?January 11, 2022 ?Binnie Rail, MD ?to Elizabeth Wilkins   ?   8:19 PM ?Sounds good.  I did order the referral for endocrine - they will call you when they can to schedule an appointment and discuss other medications for your bones that would be best for your jaw.  ?  ?  ?Binnie Rail, MD  ? ?Last read by Elizabeth Wilkins at  8:38 AM on 01/12/2022. ?  ? ?   1:54 PM ?Brand, Tobie Poet, RMA routed this conversation to Binnie Rail, MD  ?Elizabeth Wilkins ?to [facility] (supporting Binnie Rail, MD)   ?   1:36 PM ?I am going to see my endodontist on Thursday to ask about bone injection therapy if I?m a candidate I think I already know the answer to the question he did surgery on me in September for an infected root canal. I recalled that he had to scrape the bone. He said, probably the size of a dime I?m pretty sure this would preclude me from any bone injection therapy also. I still have other dental issues going on at any given time I?m not sure how that would work with using the daily injection I will let you know what They say about it.  ?

## 2022-02-14 ENCOUNTER — Encounter: Payer: Self-pay | Admitting: Gastroenterology

## 2022-03-07 DIAGNOSIS — S92354A Nondisplaced fracture of fifth metatarsal bone, right foot, initial encounter for closed fracture: Secondary | ICD-10-CM | POA: Diagnosis not present

## 2022-03-07 DIAGNOSIS — S92344A Nondisplaced fracture of fourth metatarsal bone, right foot, initial encounter for closed fracture: Secondary | ICD-10-CM | POA: Diagnosis not present

## 2022-03-09 DIAGNOSIS — Z08 Encounter for follow-up examination after completed treatment for malignant neoplasm: Secondary | ICD-10-CM | POA: Diagnosis not present

## 2022-03-09 DIAGNOSIS — D1801 Hemangioma of skin and subcutaneous tissue: Secondary | ICD-10-CM | POA: Diagnosis not present

## 2022-03-09 DIAGNOSIS — L821 Other seborrheic keratosis: Secondary | ICD-10-CM | POA: Diagnosis not present

## 2022-03-09 DIAGNOSIS — Z85828 Personal history of other malignant neoplasm of skin: Secondary | ICD-10-CM | POA: Diagnosis not present

## 2022-05-03 DIAGNOSIS — D225 Melanocytic nevi of trunk: Secondary | ICD-10-CM | POA: Diagnosis not present

## 2022-05-24 ENCOUNTER — Encounter: Payer: Self-pay | Admitting: Internal Medicine

## 2022-05-24 ENCOUNTER — Ambulatory Visit (INDEPENDENT_AMBULATORY_CARE_PROVIDER_SITE_OTHER): Payer: Medicare Other | Admitting: Internal Medicine

## 2022-05-24 VITALS — BP 142/90 | HR 62 | Wt 176.0 lb

## 2022-05-24 DIAGNOSIS — M810 Age-related osteoporosis without current pathological fracture: Secondary | ICD-10-CM | POA: Diagnosis not present

## 2022-05-24 LAB — MAGNESIUM: Magnesium: 2.2 mg/dL (ref 1.5–2.5)

## 2022-05-24 LAB — BASIC METABOLIC PANEL
BUN: 19 mg/dL (ref 6–23)
CO2: 25 mEq/L (ref 19–32)
Calcium: 9.8 mg/dL (ref 8.4–10.5)
Chloride: 103 mEq/L (ref 96–112)
Creatinine, Ser: 0.86 mg/dL (ref 0.40–1.20)
GFR: 65.31 mL/min (ref >60.00)
Glucose, Bld: 95 mg/dL (ref 70–99)
Potassium: 5.3 mEq/L — ABNORMAL HIGH (ref 3.5–5.1)
Sodium: 138 mEq/L (ref 135–145)

## 2022-05-24 LAB — PHOSPHORUS: Phosphorus: 3.7 mg/dL (ref 2.3–4.6)

## 2022-05-24 LAB — VITAMIN D 25 HYDROXY (VIT D DEFICIENCY, FRACTURES): VITD: 71.3 ng/mL (ref 30.00–100.00)

## 2022-05-24 NOTE — Patient Instructions (Signed)
Continue Calcium 600 mg twice daily  Continue Vitamin D daily  Weight bearing exercise   I would recommend prolia which is an injection every 6 months

## 2022-05-24 NOTE — Progress Notes (Unsigned)
Name: Elizabeth Wilkins  MRN/ DOB: [id number], 1946-08-04    Age/ Sex: 76 y.o., female    PCP: Binnie Rail, MD   Reason for Endocrinology Evaluation: Low bone density     Date of Initial Endocrinology Evaluation: 05/24/2022     HPI: Ms. Elizabeth Wilkins is a 76 y.o. female with a past medical history of low bone density. The patient presented for initial endocrinology clinic visit on 05/24/2022 for consultative assistance with her low bone density  Pt was diagnosed with low bone density in May 2023 with a T score of -2.2 at the left femoral neck  She is accompanied by her spouse Elizabeth Wilkins   Menarche at age : 36 Menopausal at age : 70 Fracture Hx: Left hip fracture , right wrist fracture ( slipped ) .Fracture of the fourth metatarsal of the right  foot after tripping over her dishwasher in May 2023 Hx of HRT: yes   until mid 51's  FH of osteoporosis or hip fracture: no Prior Hx of anti-estrogenic therapy :no  Prior Hx of anti-resorptive therapy : Intolerant to alendronate-weakness   She is on calcium 600 BID , started recently  Vitamin d  ~ 500 iu daily  Started exercising with a chair        HISTORY:  Past Medical History:  Past Medical History:  Diagnosis Date   Heart murmur    Hyperlipidemia    Hypertension    Osteopenia    Osteoporosis    Past Surgical History:  Past Surgical History:  Procedure Laterality Date   AUGMENTATION MAMMAPLASTY     broken wrist Right    plates and screws   COLONOSCOPY  08/02/2013   TOTAL HIP ARTHROPLASTY Right 08/22/2017   Procedure: TOTAL HIP ARTHROPLASTY ANTERIOR APPROACH AND ABDUCTOR TENDON REPAIR;  Surgeon: Rod Can, MD;  Location: [facility];  Service: Orthopedics;  Laterality: Right;   TUBAL LIGATION      Social History:  reports that she has never smoked. She has never used smokeless tobacco. She reports current alcohol use of about 7.0 standard drinks of alcohol per week. She reports that she does not use drugs. Family  History: family history includes Colon cancer (age of onset: 36) in her brother; Hyperlipidemia in her mother; Hypertension in her mother; Lupus (age of onset: 22) in her father; Pancreatic cancer (age of onset: 64) in her mother; Rectal cancer (age of onset: 55) in her brother.   HOME MEDICATIONS: Allergies as of 05/24/2022       Reactions   Clonazepam    Slurred words, drowsy   Fosamax [alendronate]    Caused significant weakness   Morphine Itching        Medication List        Accurate as of May 24, 2022  1:17 PM. If you have any questions, ask your nurse or doctor.          STOP taking these medications    acetaminophen 500 MG tablet Commonly known as: TYLENOL Stopped by: Dorita Sciara, MD   amoxicillin 500 MG capsule Commonly known as: AMOXIL Stopped by: Dorita Sciara, MD   betamethasone valerate ointment 0.1 % Commonly known as: VALISONE Stopped by: Dorita Sciara, MD   chlorhexidine 0.12 % solution Commonly known as: PERIDEX Stopped by: Dorita Sciara, MD   clobetasol 0.05 % external solution Commonly known as: TEMOVATE Stopped by: Dorita Sciara, MD   meloxicam 7.5 MG tablet Commonly known as: MOBIC  Stopped by: Dorita Sciara, MD       TAKE these medications    linaclotide 72 MCG capsule Commonly known as: Linzess Take 1 capsule (72 mcg total) by mouth daily before breakfast.   PARoxetine 20 MG tablet Commonly known as: PAXIL Take 1 tablet (20 mg total) by mouth daily.   Vitamin D (Ergocalciferol) 1.25 MG (50000 UNIT) Caps capsule Commonly known as: DRISDOL Take 50,000 Units by mouth 2 (two) times daily.          REVIEW OF SYSTEMS: A comprehensive ROS was conducted with the patient and is negative except as per HPI and below:  ROS     OBJECTIVE:  VS: BP (!) 142/90 (BP Location: Left Arm, Patient Position: Sitting, Cuff Size: Normal)   Pulse 62   Wt 176 lb (79.8 kg)   SpO2 99%   BMI  25.99 kg/m    Wt Readings from Last 3 Encounters:  05/24/22 176 lb (79.8 kg)  02/04/22 169 lb (76.7 kg)  12/30/21 170 lb 9.6 oz (77.4 kg)     EXAM: Neck: General: Supple without adenopathy. Thyroid: Thyroid size normal.  No goiter or nodules appreciated. No thyroid bruit.  Lungs: Clear with good BS bilat with no rales, rhonchi, or wheezes  Heart: Auscultation: RRR.  Abdomen: Normoactive bowel sounds, soft, nontender, without masses or organomegaly palpable  Extremities:  BL LE: No pretibial edema normal ROM and strength.  Mental Status: Judgment, insight: Intact Orientation: Oriented to time, place, and person Mood and affect: No depression, anxiety, or agitation     DATA REVIEWED: ***  DXA 02/03/2022  Results:   Lumbar spine L1-L3 (L4) Femoral neck (FN) 33% distal radius  T-score -1.4 RFN: n/a LFN: -2.2 n/a  Change in BMD from previous DXA test (%) -3.0%* -3.4% n/a  (*) statistically significant    ASSESSMENT/PLAN/RECOMMENDATIONS:   Osteoporosis :  - Pt with clinical diagnosis of osteoporosis given wrist and hip fractures in the past  - Metatarsal fractures are not considered fragility fractures  -    Medications :  Signed electronically by: Mack Guise, MD  [facility]  [address] [place], [place] [address] Phone: [phone] FAX: [phone]   CC: Binnie Rail, MD [place] [address] Phone: [phone] Fax: [phone]   Return to Endocrinology clinic as below: Future Appointments  Date Time Provider [place]  07/11/2022 10:20 AM Binnie Rail, MD [facility] None

## 2022-05-25 LAB — PARATHYROID HORMONE, INTACT (NO CA): PTH: 29 pg/mL (ref 16–77)

## 2022-06-06 DIAGNOSIS — L309 Dermatitis, unspecified: Secondary | ICD-10-CM | POA: Diagnosis not present

## 2022-06-30 ENCOUNTER — Telehealth: Payer: Self-pay | Admitting: Internal Medicine

## 2022-06-30 NOTE — Telephone Encounter (Signed)
LVM for pt to rtn my call to schedule AWV with [facility] call back # [phone] 

## 2022-07-10 NOTE — Patient Instructions (Addendum)
     Blood work was ordered.     Medications changes include :      Your prescription(s) have been sent to your pharmacy.    A referral was ordered for XX.     Someone from that office will call you to schedule an appointment.    Return in about 1 year (around 07/12/2023) for Physical Exam.

## 2022-07-10 NOTE — Progress Notes (Unsigned)
Subjective:    Patient ID: Elizabeth Wilkins, female    DOB: 02/23/1946, 76 y.o.   MRN: [id number]     HPI Elizabeth Wilkins is here for follow up of her chronic medical problems, including anxiety/depression, OP, hld, vit d def  She is taking 10 mg of paxil daily.  She feels this is working well.  BP at home is well controlled  She does not feel that she will take anything for her bones.  She is trying to exercise more regularly and is very concerned about possible side effects  Medications and allergies reviewed with patient and updated if appropriate.  Current Outpatient Medications on File Prior to Visit  Medication Sig Dispense Refill   linaclotide (LINZESS) 72 MCG capsule Take 1 capsule (72 mcg total) by mouth daily before breakfast. 90 capsule 3   PARoxetine (PAXIL) 20 MG tablet Take 1 tablet (20 mg total) by mouth daily. 90 tablet 3   No current facility-administered medications on file prior to visit.     Review of Systems  Constitutional:  Negative for chills and fever.  Respiratory:  Negative for cough, shortness of breath and wheezing.   Cardiovascular:  Negative for chest pain, palpitations and leg swelling.  Gastrointestinal:  Positive for constipation. Negative for abdominal pain, blood in stool and diarrhea.  Neurological:  Negative for light-headedness and headaches.       Objective:   Vitals:   07/11/22 1027  BP: (!) 142/80  Pulse: 68  Temp: 98.1 F (36.7 C)  SpO2: 99%   BP Readings from Last 3 Encounters:  07/11/22 (!) 142/80  05/24/22 (!) 142/90  02/06/22 (!) 153/71   Wt Readings from Last 3 Encounters:  07/11/22 175 lb (79.4 kg)  05/24/22 176 lb (79.8 kg)  02/04/22 169 lb (76.7 kg)   Body mass index is 25.84 kg/m.    Physical Exam Constitutional:      General: She is not in acute distress.    Appearance: Normal appearance.  HENT:     Head: Normocephalic and atraumatic.  Eyes:     Conjunctiva/sclera: Conjunctivae normal.  Cardiovascular:      Rate and Rhythm: Normal rate and regular rhythm.     Heart sounds: Normal heart sounds. No murmur heard. Pulmonary:     Effort: Pulmonary effort is normal. No respiratory distress.     Breath sounds: Normal breath sounds. No wheezing.  Abdominal:     General: There is no distension.     Palpations: Abdomen is soft.     Tenderness: There is no abdominal tenderness.  Musculoskeletal:     Cervical back: Neck supple.     Right lower leg: No edema.     Left lower leg: No edema.  Lymphadenopathy:     Cervical: No cervical adenopathy.  Skin:    General: Skin is warm and dry.     Findings: No rash.  Neurological:     Mental Status: She is alert. Mental status is at baseline.  Psychiatric:        Mood and Affect: Mood normal.        Behavior: Behavior normal.        Lab Results  Component Value Date   WBC 3.4 (L) 12/30/2021   HGB 14.5 12/30/2021   HCT 42.1 12/30/2021   PLT 253.0 12/30/2021   GLUCOSE 95 05/24/2022   CHOL 192 12/30/2021   TRIG 72.0 12/30/2021   HDL 76.90 12/30/2021   LDLDIRECT 140.3 07/24/2013  LDLCALC 100 (H) 12/30/2021   ALT 15 12/30/2021   AST 23 12/30/2021   NA 138 05/24/2022   K 5.3 (H) 05/24/2022   CL 103 05/24/2022   CREATININE 0.86 05/24/2022   BUN 19 05/24/2022   CO2 25 05/24/2022   TSH 1.81 12/30/2021   HGBA1C 5.0 12/11/2019     Assessment & Plan:    See Problem List for Assessment and Plan of chronic medical problems.

## 2022-07-11 ENCOUNTER — Ambulatory Visit (INDEPENDENT_AMBULATORY_CARE_PROVIDER_SITE_OTHER): Payer: Medicare Other | Admitting: Internal Medicine

## 2022-07-11 ENCOUNTER — Encounter: Payer: Self-pay | Admitting: Internal Medicine

## 2022-07-11 VITALS — BP 142/80 | HR 68 | Temp 98.1°F | Ht 69.0 in | Wt 175.0 lb

## 2022-07-11 DIAGNOSIS — F419 Anxiety disorder, unspecified: Secondary | ICD-10-CM

## 2022-07-11 DIAGNOSIS — K5900 Constipation, unspecified: Secondary | ICD-10-CM

## 2022-07-11 DIAGNOSIS — F3289 Other specified depressive episodes: Secondary | ICD-10-CM | POA: Diagnosis not present

## 2022-07-11 DIAGNOSIS — M810 Age-related osteoporosis without current pathological fracture: Secondary | ICD-10-CM | POA: Diagnosis not present

## 2022-07-11 DIAGNOSIS — L509 Urticaria, unspecified: Secondary | ICD-10-CM | POA: Insufficient documentation

## 2022-07-11 MED ORDER — PAROXETINE HCL 20 MG PO TABS: 20.0000 mg | ORAL_TABLET | Freq: Every day | ORAL | 3 refills | Status: DC

## 2022-07-11 NOTE — Assessment & Plan Note (Signed)
Chronic Has had side effects from oral bisphosphonates Hesitant to take any other medication because of possible side effects Did see Dr. Larose Hires follow-up-considering Prolia Stressed regular exercise, calcium, vitamin D

## 2022-07-11 NOTE — Assessment & Plan Note (Signed)
Chronic Has seen GI Taking Linzess 72 mcg every other day which seems to work well

## 2022-07-11 NOTE — Assessment & Plan Note (Signed)
Chronic Controlled, Stable Continue paroxetine 20 mg daily 

## 2022-07-11 NOTE — Assessment & Plan Note (Addendum)
Chronic Controlled, Stable Continue paroxetine 10 mg daily-we will keep prescription at 20 mg daily and she can increase the dose if needed, which she has had to do in the past

## 2022-07-13 ENCOUNTER — Telehealth: Payer: Self-pay | Admitting: Internal Medicine

## 2022-07-13 NOTE — Telephone Encounter (Signed)
Left message for patient to call back and schedule Medicare Annual Wellness Visit (AWV).   Please offer to do virtually or by telephone.  Left office number and my jabber [phone].  Last AWV:03/30/2017  Please schedule at anytime with Nurse Health Advisor.

## 2022-08-29 DIAGNOSIS — M7062 Trochanteric bursitis, left hip: Secondary | ICD-10-CM | POA: Diagnosis not present

## 2022-08-29 DIAGNOSIS — M1612 Unilateral primary osteoarthritis, left hip: Secondary | ICD-10-CM | POA: Diagnosis not present

## 2022-08-29 DIAGNOSIS — Z96641 Presence of right artificial hip joint: Secondary | ICD-10-CM | POA: Diagnosis not present

## 2022-11-02 ENCOUNTER — Other Ambulatory Visit: Payer: Self-pay

## 2022-11-02 MED ORDER — LINACLOTIDE 72 MCG PO CAPS
72.0000 ug | ORAL_CAPSULE | Freq: Every day | ORAL | 0 refills | Status: DC
Start: 2022-11-02 — End: 2022-11-16

## 2022-11-02 NOTE — Telephone Encounter (Signed)
Linzess refilled, patient seen 01/2022. Note attached to call for appointment please.

## 2022-11-04 ENCOUNTER — Ambulatory Visit: Payer: 59 | Admitting: Internal Medicine

## 2022-11-16 ENCOUNTER — Other Ambulatory Visit: Payer: Self-pay | Admitting: Internal Medicine

## 2022-11-29 NOTE — Progress Notes (Unsigned)
Name: Elizabeth Wilkins  MRN/ DOB: [id number], 07-17-1946    Age/ Sex: 77 y.o., female    PCP: Binnie Rail, MD   Reason for Endocrinology Evaluation: Osteoporosis     Date of Initial Endocrinology Evaluation: 05/24/2022    HPI: Elizabeth Wilkins is a 77 y.o. female with a past medical history of osteoporosis and dyslipidemia. The patient presented for initial endocrinology clinic visit on 05/24/2022 for consultative assistance with her osteoporosis.   Pt was diagnosed with osteoporosis: 2021 with a T-score -3.5 at the distal radius  Menarche at age : 4 Menopausal at age : 56 Fracture Hx: hip fracture , right wrist fracture ( slipped ) .Fracture of the fourth metatarsal of the right  foot after tripping over her dishwasher in May 2023  Hx of HRT: Yes until mid 51's  FH of osteoporosis or hip fracture: no Prior Hx of anti-estrogenic therapy :no  Prior Hx of anti-resorptive therapy : Alendronate- significant weakness   She continues with chair Yoga  She has noted decrease joint pains that she attributes to collagen and zinc intake   She is on calcium 600 BID Vitamin d+K   ~ BID   Exercising with a chair   Denies  heartburn Has chronic constipation , uses Linzess   HISTORY:  Past Medical History:  Past Medical History:  Diagnosis Date   Heart murmur    Hyperlipidemia    Hypertension    Osteopenia    Osteoporosis    Past Surgical History:  Past Surgical History:  Procedure Laterality Date   AUGMENTATION MAMMAPLASTY     broken wrist Right    plates and screws   COLONOSCOPY  08/02/2013   TOTAL HIP ARTHROPLASTY Right 08/22/2017   Procedure: TOTAL HIP ARTHROPLASTY ANTERIOR APPROACH AND ABDUCTOR TENDON REPAIR;  Surgeon: Rod Can, MD;  Location: [facility];  Service: Orthopedics;  Laterality: Right;   TUBAL LIGATION      Social History:  reports that she has never smoked. She has never used smokeless tobacco. She reports current alcohol use of about 7.0 standard  drinks of alcohol per week. She reports that she does not use drugs. Family History: family history includes Colon cancer (age of onset: 20) in her brother; Hyperlipidemia in her mother; Hypertension in her mother; Lupus (age of onset: 75) in her father; Pancreatic cancer (age of onset: 58) in her mother; Rectal cancer (age of onset: 93) in her brother.   HOME MEDICATIONS: Allergies as of 11/30/2022       Reactions   Clonazepam    Slurred words, drowsy   Fosamax [alendronate]    Caused significant weakness   Morphine Itching        Medication List        Accurate as of November 30, 2022 10:46 AM. If you have any questions, ask your nurse or doctor.          Linzess 72 MCG capsule Generic drug: linaclotide TAKE 1 CAPSULE DAILY BEFORE BREAKFAST   PARoxetine 20 MG tablet Commonly known as: PAXIL Take 1 tablet (20 mg total) by mouth daily. What changed: how much to take          REVIEW OF SYSTEMS: A comprehensive ROS was conducted with the patient and is negative except as per HPI    OBJECTIVE:  VS: BP 122/74 (BP Location: Left Arm, Patient Position: Sitting, Cuff Size: Small)   Pulse 76   Ht '5\' 9"'$  (1.753 m)   Wt  180 lb (81.6 kg)   SpO2 98%   BMI 26.58 kg/m    Wt Readings from Last 3 Encounters:  11/30/22 180 lb (81.6 kg)  07/11/22 175 lb (79.4 kg)  05/24/22 176 lb (79.8 kg)     EXAM: General: Pt appears well and is in NAD  Eyes: External eye exam normal without stare, lid lag or exophthalmos.  EOM intact.   Neck: General: Supple without adenopathy. Thyroid: Thyroid size normal.  No goiter or nodules appreciated   Lungs: Clear with good BS bilat   Heart: Auscultation: RRR.  Abdomen: soft, nontender, without masses or organomegaly palpable  Extremities:  BL LE: No pretibial edema normal ROM and strength.  Mental Status: Judgment, insight: Intact Orientation: Oriented to time, place, and person Mood and affect: No depression, anxiety, or agitation      DATA REVIEWED:    02/03/2022   Results:   Lumbar spine L1-L3 (L4) Femoral neck (FN) 33% distal radius  T-score -1.4 RFN: n/a LFN: -2.2 n/a  Change in BMD from previous DXA test (%) -3.0%* -3.4% n/a  (*) statistically significant  ASSESSMENT/PLAN/RECOMMENDATIONS:   Osteoporosis  -She has been diagnosed with osteoporosis based on a T-score of -3.5 at the distal radius in 2021, unfortunately her last DXA scan did not include the distal radius and it was also done at a different location than prior. -She is intolerant to alendronate, I have offered her Prolia in the past but today she returns with continued skepticism regarding the risk of osteonecrosis.  I did explain to the patient this is very rare but she has dental implant and she continuously has dental issues -I did offer PTH analogs such as Forteo and Tymlos as they seem to have no risk of osteonecrosis -Patient would like to think about this -Emphasized the importance of weightbearing exercises, optimizing calcium and vitamin D intake    Recommend continuation of current Tx with primary MD and consultative f/u at [facility] in the future if the patient opts to take either antiresorptive or anabolic agents   Signed electronically by: Mack Guise, MD  [facility]  [address] [place], [place] [address] Phone: [phone] FAX: [phone]   CC: Binnie Rail, MD [place] [address] Phone: [phone] Fax: [phone]   Return to Endocrinology clinic as below: No future appointments.

## 2022-11-30 ENCOUNTER — Ambulatory Visit (INDEPENDENT_AMBULATORY_CARE_PROVIDER_SITE_OTHER): Payer: Medicare Other | Admitting: Internal Medicine

## 2022-11-30 ENCOUNTER — Encounter: Payer: Self-pay | Admitting: Internal Medicine

## 2022-11-30 VITALS — BP 122/74 | HR 76 | Ht 69.0 in | Wt 180.0 lb

## 2022-11-30 DIAGNOSIS — M810 Age-related osteoporosis without current pathological fracture: Secondary | ICD-10-CM

## 2022-11-30 NOTE — Patient Instructions (Signed)
Please contact us should you like to start Forteo/Tymlos

## 2022-12-13 ENCOUNTER — Ambulatory Visit: Payer: Medicare Other | Admitting: Podiatry

## 2022-12-15 DIAGNOSIS — M792 Neuralgia and neuritis, unspecified: Secondary | ICD-10-CM | POA: Diagnosis not present

## 2022-12-15 DIAGNOSIS — B351 Tinea unguium: Secondary | ICD-10-CM | POA: Diagnosis not present

## 2022-12-15 DIAGNOSIS — M19071 Primary osteoarthritis, right ankle and foot: Secondary | ICD-10-CM | POA: Diagnosis not present

## 2022-12-15 DIAGNOSIS — L609 Nail disorder, unspecified: Secondary | ICD-10-CM | POA: Diagnosis not present

## 2022-12-28 DIAGNOSIS — L601 Onycholysis: Secondary | ICD-10-CM | POA: Diagnosis not present

## 2023-01-01 ENCOUNTER — Encounter: Payer: Self-pay | Admitting: Internal Medicine

## 2023-01-03 DIAGNOSIS — M792 Neuralgia and neuritis, unspecified: Secondary | ICD-10-CM | POA: Diagnosis not present

## 2023-01-03 DIAGNOSIS — M19071 Primary osteoarthritis, right ankle and foot: Secondary | ICD-10-CM | POA: Diagnosis not present

## 2023-01-03 DIAGNOSIS — L602 Onychogryphosis: Secondary | ICD-10-CM | POA: Diagnosis not present

## 2023-01-17 DIAGNOSIS — M79672 Pain in left foot: Secondary | ICD-10-CM | POA: Diagnosis not present

## 2023-01-27 ENCOUNTER — Encounter: Payer: Self-pay | Admitting: Internal Medicine

## 2023-02-06 DIAGNOSIS — S92912A Unspecified fracture of left toe(s), initial encounter for closed fracture: Secondary | ICD-10-CM | POA: Diagnosis not present

## 2023-02-06 DIAGNOSIS — M79672 Pain in left foot: Secondary | ICD-10-CM | POA: Diagnosis not present

## 2023-03-13 ENCOUNTER — Ambulatory Visit (INDEPENDENT_AMBULATORY_CARE_PROVIDER_SITE_OTHER): Payer: Medicare Other | Admitting: Internal Medicine

## 2023-03-13 ENCOUNTER — Encounter: Payer: Self-pay | Admitting: Internal Medicine

## 2023-03-13 VITALS — BP 122/80 | HR 63 | Temp 98.4°F | Ht 69.0 in | Wt 174.6 lb

## 2023-03-13 DIAGNOSIS — B349 Viral infection, unspecified: Secondary | ICD-10-CM | POA: Insufficient documentation

## 2023-03-13 DIAGNOSIS — F419 Anxiety disorder, unspecified: Secondary | ICD-10-CM

## 2023-03-13 DIAGNOSIS — F3289 Other specified depressive episodes: Secondary | ICD-10-CM

## 2023-03-13 NOTE — Patient Instructions (Addendum)
? ? ? ?  ? ? ?  Medications changes include :  none  ? ? ? ? ? ?Return if symptoms worsen or fail to improve. ? ?

## 2023-03-13 NOTE — Progress Notes (Signed)
Subjective:    Patient ID: Elizabeth Wilkins, female    DOB: [birth year], 77 y.o.   MRN: [id number]      HPI Elizabeth Wilkins is here for No chief complaint on file.    Had chills one night - the nex tday ver ysore all over mostly in neck and upper back - could not move her neck. She was very fatigued.  Lasted two days w/o improvement then slowly improved.  Now feels much.  Lasted about one week.  She almost canceled the appointment because she is feeling better.  Her grandson died unexpectedly about 1 year ago and that is still hard for her to deal with.  She feels her medication is helping and the dose is appropriate.  She does not feel like she needs to talk to anyone, but she does cry intermittently.    Medications and allergies reviewed with patient and updated if appropriate.  Current Outpatient Medications on File Prior to Visit  Medication Sig Dispense Refill   LINZESS 72 MCG capsule TAKE 1 CAPSULE DAILY BEFORE BREAKFAST 90 capsule 3   PARoxetine (PAXIL) 20 MG tablet Take 1 tablet (20 mg total) by mouth daily. (Patient taking differently: Take 10 mg by mouth daily.) 90 tablet 3   SHINGRIX injection Inject 0.5 mLs into the muscle once.     No current facility-administered medications on file prior to visit.    Review of Systems  Constitutional:  Positive for chills and fatigue.  HENT:  Positive for rhinorrhea.   Respiratory:  Positive for cough (a little).   Musculoskeletal:  Positive for myalgias and neck pain.  Neurological:  Negative for light-headedness and headaches.       Objective:   Vitals:   03/13/23 1423  BP: 122/80  Pulse: 63  Temp: 98.4 F (36.9 C)  SpO2: 98%   BP Readings from Last 3 Encounters:  03/13/23 122/80  11/30/22 122/74  07/11/22 (!) 142/80   Wt Readings from Last 3 Encounters:  03/13/23 174 lb 9.6 oz (79.2 kg)  11/30/22 180 lb (81.6 kg)  07/11/22 175 lb (79.4 kg)   Body mass index is 25.78 kg/m.    Physical Exam Constitutional:       General: She is not in acute distress.    Appearance: Normal appearance. She is not ill-appearing.  HENT:     Head: Normocephalic and atraumatic.     Right Ear: Tympanic membrane, ear canal and external ear normal.     Left Ear: Tympanic membrane, ear canal and external ear normal.     Mouth/Throat:     Mouth: Mucous membranes are moist.     Pharynx: No oropharyngeal exudate or posterior oropharyngeal erythema.  Eyes:     Conjunctiva/sclera: Conjunctivae normal.  Cardiovascular:     Rate and Rhythm: Normal rate and regular rhythm.  Pulmonary:     Effort: Pulmonary effort is normal. No respiratory distress.     Breath sounds: Normal breath sounds. No wheezing or rales.  Musculoskeletal:     Cervical back: Neck supple. No tenderness.  Lymphadenopathy:     Cervical: No cervical adenopathy.  Skin:    General: Skin is warm and dry.  Neurological:     Mental Status: She is alert.  Psychiatric:        Mood and Affect: Mood normal.        Behavior: Behavior normal.     Comments: Cried at 1 point when she talked about her grandson's death  Assessment & Plan:    See Problem List for Assessment and Plan of chronic medical problems.

## 2023-03-13 NOTE — Assessment & Plan Note (Signed)
Acute Most recent symptoms likely related to a viral illness Symptoms have improved, but not completely resolved-energy level still on the low side Expect that to continue to improve At this point we both agree no workup is necessary-she will monitor and let me know if her symptoms do not continue to improve and resolve

## 2023-03-13 NOTE — Assessment & Plan Note (Signed)
Chronic Depression is controlled, but she is still struggling to deal with her grandson's death that was unexpected 1 year ago-he was 12 She states he does has to cry intermittently and it still hard Deferred referral to a therapist She feels her paroxetine dose is okay and does not need to be adjusted Continue paroxetine 10 mg daily

## 2023-03-13 NOTE — Assessment & Plan Note (Signed)
Chronic She feels her anxiety is controlled Continue paroxetine 10 mg daily

## 2023-03-16 DIAGNOSIS — L218 Other seborrheic dermatitis: Secondary | ICD-10-CM | POA: Diagnosis not present

## 2023-04-10 ENCOUNTER — Telehealth: Payer: Self-pay | Admitting: Internal Medicine

## 2023-04-10 ENCOUNTER — Encounter: Payer: Self-pay | Admitting: Urgent Care

## 2023-04-10 NOTE — Telephone Encounter (Signed)
Pt called stating that the medication Linzess is no longer working for her. Please advise.

## 2023-04-18 DIAGNOSIS — Z96641 Presence of right artificial hip joint: Secondary | ICD-10-CM | POA: Diagnosis not present

## 2023-05-18 DIAGNOSIS — M25551 Pain in right hip: Secondary | ICD-10-CM | POA: Diagnosis not present

## 2023-06-13 DIAGNOSIS — M25552 Pain in left hip: Secondary | ICD-10-CM | POA: Diagnosis not present

## 2023-06-27 ENCOUNTER — Encounter: Payer: Self-pay | Admitting: Nurse Practitioner

## 2023-06-27 ENCOUNTER — Ambulatory Visit (INDEPENDENT_AMBULATORY_CARE_PROVIDER_SITE_OTHER): Payer: Medicare Other | Admitting: Nurse Practitioner

## 2023-06-27 VITALS — BP 120/70 | Ht 69.0 in | Wt 175.8 lb

## 2023-06-27 DIAGNOSIS — Z860102 Personal history of hyperplastic colon polyps: Secondary | ICD-10-CM | POA: Diagnosis not present

## 2023-06-27 DIAGNOSIS — K5900 Constipation, unspecified: Secondary | ICD-10-CM

## 2023-06-27 DIAGNOSIS — K5909 Other constipation: Secondary | ICD-10-CM | POA: Diagnosis not present

## 2023-06-27 DIAGNOSIS — Z860100 Personal history of colon polyps, unspecified: Secondary | ICD-10-CM | POA: Diagnosis not present

## 2023-06-27 DIAGNOSIS — Z800 Family history of malignant neoplasm of digestive organs: Secondary | ICD-10-CM

## 2023-06-27 MED ORDER — LINACLOTIDE 145 MCG PO CAPS: 145.0000 ug | ORAL_CAPSULE | Freq: Every day | ORAL | 1 refills | Status: DC

## 2023-06-27 NOTE — Progress Notes (Signed)
06/27/2023 Elizabeth Wilkins [id number] 05-15-1946   Chief Complaint: Constipation follow-up  History of Present Illness: Elizabeth Wilkins is a 77 year old female with a past medical history of hypertension, hyperlipidemia, osteopenia, constipation, diverticulosis and colon polyps.. She is known by Dr. Lavon Paganini.  She presents today for constipation follow-up.  She takes Linzess 72 mcg once daily or every other day which typically results in passing 1 formed stool followed by 1 or 2 subsequent loose stools daily.  She does not always feel emptied and questions if Linzess dose can be increased. She occasionally skips a day without passing a bowel movement.  No rectal bleeding or black stools.  No abdominal pain.  Her most recent colonoscopy was 12/31/2019 which identified 1 polypoid polyp removed from the transverse colon and 2 hyperplastic polyps removed from the rectum.  She was advised to repeat a colonoscopy in 5 years.  Brother with history of colon cancer diagnosed in his mid 63s.  She eats a healthy diet and drinks about 64 ounces of water daily.     Latest Ref Rng & Units 12/30/2021   10:23 AM 01/06/2021   11:44 AM 06/24/2019    3:59 PM  CBC  WBC 4.0 - 10.5 K/uL 3.4  3.7  4.3   Hemoglobin 12.0 - 15.0 g/dL 40.9  81.1  91.4   Hematocrit 36.0 - 46.0 % 42.1  42.8  38.2   Platelets 150.0 - 400.0 K/uL 253.0  239.0  263.0        Latest Ref Rng & Units 05/24/2022    1:54 PM 01/10/2022    2:43 PM 12/30/2021   10:23 AM  CMP  Glucose 70 - 99 mg/dL 95  782  96   BUN 6 - 23 mg/dL 19  18  10    Creatinine 0.40 - 1.20 mg/dL 9.56  2.13  0.86   Sodium 135 - 145 mEq/L 138  132  127   Potassium 3.5 - 5.1 mEq/L 5.3  4.7  5.1   Chloride 96 - 112 mEq/L 103  98  92   CO2 19 - 32 mEq/L 25  26  27    Calcium 8.4 - 10.5 mg/dL 9.8  9.4  9.7    9.9   Total Protein 6.0 - 8.3 g/dL   7.0   Total Bilirubin 0.2 - 1.2 mg/dL   0.9   Alkaline Phos 39 - 117 U/L   77   AST 0 - 37 U/L   23   ALT 0 - 35 U/L   15   TSH  1.81 on 12/30/2021  Colonoscopy December 31, 2019: - Three 1 to 2 mm polyps [hyperplastic] in the rectum, in the descending colon and in the transverse colon, removed with a cold biopsy forceps. Resected and retrieved. - Diverticulosis in the sigmoid colon. - Non-bleeding internal hemorrhoids. 1. Surgical [P], colon, transverse, polyp - BENIGN POLYPOID COLORECTAL MUCOSA. - THERE IS NO EVIDENCE OF MALIGNANCY. 2. Surgical [P], colon, descending, rectal, polyp (2) - HYPERPLASTIC POLYP(S). - THERE IS NO EVIDENCE OF MALIGNANCY   Colonoscopy in November 2014 by Dr. Leone Payor: Excellent prep with findings of sigmoid diverticulosis and was normal exam otherwise, prior to this she had a normal colonoscopy in 2004.   Past Medical History:  Diagnosis Date   Heart murmur    Hyperlipidemia    Hypertension    Osteopenia    Osteoporosis    Past Surgical History:  Procedure Laterality Date   AUGMENTATION MAMMAPLASTY  broken wrist Right    plates and screws   COLONOSCOPY  08/02/2013   TOTAL HIP ARTHROPLASTY Right 08/22/2017   Procedure: TOTAL HIP ARTHROPLASTY ANTERIOR APPROACH AND ABDUCTOR TENDON REPAIR;  Surgeon: Samson Frederic, MD;  Location: [facility];  Service: Orthopedics;  Laterality: Right;   TUBAL LIGATION     Current Outpatient Medications on File Prior to Visit  Medication Sig Dispense Refill   LINZESS 72 MCG capsule TAKE 1 CAPSULE DAILY BEFORE BREAKFAST 90 capsule 3   PARoxetine (PAXIL) 20 MG tablet Take 1 tablet (20 mg total) by mouth daily. (Patient taking differently: Take 10 mg by mouth daily.) 90 tablet 3   SHINGRIX injection Inject 0.5 mLs into the muscle once.     No current facility-administered medications on file prior to visit.   Allergies  Allergen Reactions   Clonazepam     Slurred words, drowsy   Fosamax [Alendronate]     Caused significant weakness   Morphine Itching    Current Medications, Allergies, Past Medical History, Past Surgical History, Family History  and Social History were reviewed in [employer] record.  Review of Systems:   Constitutional: Negative for fever, sweats, chills or weight loss.  Respiratory: Negative for shortness of breath.   Cardiovascular: Negative for chest pain, palpitations and leg swelling.  Gastrointestinal: See HPI.  Musculoskeletal: Negative for back pain or muscle aches.  Neurological: Negative for dizziness, headaches or paresthesias.   Physical Exam: BP 120/70   Ht 5\' 9"  (1.753 m)   Wt 175 lb 12.8 oz (79.7 kg)   BMI 25.96 kg/m   Wt Readings from Last 3 Encounters:  06/27/23 175 lb 12.8 oz (79.7 kg)  03/13/23 174 lb 9.6 oz (79.2 kg)  11/30/22 180 lb (81.6 kg)    General: 77 year old female in no acute distress. Head: Normocephalic and atraumatic. Eyes: No scleral icterus. Conjunctiva pink . Ears: Normal auditory acuity. Mouth: Dentition intact. No ulcers or lesions.  Lungs: Clear throughout to auscultation. Heart: Regular rate and rhythm, ?  soft murmur. Abdomen: Soft, nontender and nondistended. No masses or hepatomegaly. Normal bowel sounds x 4 quadrants.  Rectal: Deferred.  Musculoskeletal: Symmetrical with no gross deformities. Extremities: No edema. Neurological: Alert oriented x 4. No focal deficits.  Psychological: Alert and cooperative. Normal mood and affect  Assessment and Recommendations:  77 year old female with a history of chronic constipation on Linzess 72 mcg daily, sometimes takes every other day which results in passing a solid stool followed by 1 or 2 subsequent looser stools.  She does not always feel emptied. -Increase Linzess to 140 mcg 1 tab to be taken p.o. 30 minutes before breakfast daily or every other day -Benefiber 1 tablespoon daily as tolerated -Patient to contact me in 2 to 3 weeks with an update  History of colon polyps.  Colonoscopy 12/31/2019 identified 2 hyperplastic polyps removed from the rectum and one polypoid polyp removed from the  transverse colon.  Family history of colorectal cancer. -Next surveillance colonoscopy due April 2026

## 2023-06-27 NOTE — Patient Instructions (Addendum)
Benefiber one tablespoon once daily for constipation and will bulk up stool, purchased over the counter   Try Linzess one tab to be taken 30 minutes before breast once daily as needed   Contact Colleen NP in 2 to 3 weeks with an update regarding  higher dose Linzess response   Follow up as needed and in one year  Due to recent changes in healthcare laws, you may see the results of your imaging and laboratory studies on MyChart before your provider has had a chance to review them.  We understand that in some cases there may be results that are confusing or concerning to you. Not all laboratory results come back in the same time frame and the provider may be waiting for multiple results in order to interpret others.  Please give [place] 48 hours in order for your provider to thoroughly review all the results before contacting the office for clarification of your results.   Thank you for trusting me with your gastrointestinal care!   Alcide Evener, CRNP

## 2023-08-14 ENCOUNTER — Other Ambulatory Visit: Payer: Self-pay | Admitting: Gastroenterology

## 2023-08-22 ENCOUNTER — Telehealth: Payer: Self-pay | Admitting: Nurse Practitioner

## 2023-08-22 MED ORDER — LINACLOTIDE 145 MCG PO CAPS: 145.0000 ug | ORAL_CAPSULE | Freq: Every day | ORAL | 1 refills | Status: DC

## 2023-08-22 NOTE — Telephone Encounter (Signed)
Please review latest prescription sent in and recent office note from Alcide Evener NP on 06/27/2023.

## 2023-08-22 NOTE — Telephone Encounter (Signed)
Calling in regards to changing linzess doze. Please advise.

## 2023-08-22 NOTE — Telephone Encounter (Signed)
I spoke with patient, in Colleens last office note she increased the Linzess from 72 mcg to 145 mcg. I sent in 72 mcg going by Dr Sharol Given note and that was the electronic refill request that came through..  I spoke with the patient to get a better understanding and she said she had already picked up the 72 mcg and wanted to keep them , I told her if she felt the need to take 2 at a time she could and titrate as needed. If two is too much she will go back to 72 mcg. She still wanted me to send in the 145 mcg in case the 2 -72 mcg works for her she will need another rx because this one will run out. I sent in the 145 mcg as well at patients request. I told her to call the office if she had any further questions

## 2023-08-23 ENCOUNTER — Telehealth: Payer: Self-pay | Admitting: Internal Medicine

## 2023-08-23 MED ORDER — FLUCONAZOLE 150 MG PO TABS: 150.0000 mg | ORAL_TABLET | Freq: Once | ORAL | 0 refills | Status: AC

## 2023-08-23 NOTE — Telephone Encounter (Signed)
Diflucan sent to  [facility]

## 2023-08-23 NOTE — Telephone Encounter (Signed)
Pt called stating she was put on anabiotics from her dentist and it cause her to get an yeast infection and the pt is wondering can Dr. Lawerance Bach prescribe he something. Please advise.

## 2023-08-30 NOTE — Telephone Encounter (Signed)
Inbound call from patient, stating 145 mcg is too strong would like to discuss more options.

## 2023-08-31 ENCOUNTER — Other Ambulatory Visit: Payer: Self-pay

## 2023-08-31 DIAGNOSIS — K5904 Chronic idiopathic constipation: Secondary | ICD-10-CM

## 2023-08-31 DIAGNOSIS — R110 Nausea: Secondary | ICD-10-CM

## 2023-08-31 DIAGNOSIS — R197 Diarrhea, unspecified: Secondary | ICD-10-CM

## 2023-08-31 NOTE — Telephone Encounter (Signed)
Returned patient call & she stated she increased her linzess to 145 mcg on Monday, however felt it was too strong so she took a 72 mcg today. She's been experiencing diarrhea at least twice/day, but feels that she isn't completely emptying her bowels. She's tolerating some food/liquids, but feels very sick to her stomach. Denies any abdominal pain. She'd like to know how to manage her linzess going forward & if she needs to come in for an abdominal xray. Last seen with Jill Side, NP in 06/2023.

## 2023-08-31 NOTE — Telephone Encounter (Signed)
Inbound call from patient stating she is currently having pain and "sick to her stomach". Requesting a urgent call to discuss if she should go to the ED. Please advise, thank you.

## 2023-08-31 NOTE — Telephone Encounter (Signed)
Spoke with patient regarding NP recommendations & orders placed. Advised her on when/where to go for labs/XR. Pt had verbalized all understanding & had no further questions.

## 2023-08-31 NOTE — Telephone Encounter (Signed)
Kaira, please have patient stop Linzess as it is not working for her and she endorses feeling sick to her stomach. Pls send her to our lab for a CBC, CMP and CRP. Ok to send her for an abdominal xray to rule out stool filled colon. Good to know she is not experiencing any abdominal pain. If she's had diarrhea today or tomorrow, hold off on any laxatives. If no BM tomorrow/no diarrhea, she can take Miralax once or twice daily until I receive her lab and abdominal xray results. If she has severe abdominal pain or develops vomiting, she should go to the ED. THX.

## 2023-08-31 NOTE — Telephone Encounter (Signed)
Patent called to follow up on message below. Was advise she will get a call back.

## 2023-09-01 ENCOUNTER — Other Ambulatory Visit (INDEPENDENT_AMBULATORY_CARE_PROVIDER_SITE_OTHER): Payer: Medicare Other

## 2023-09-01 ENCOUNTER — Ambulatory Visit (INDEPENDENT_AMBULATORY_CARE_PROVIDER_SITE_OTHER)
Admission: RE | Admit: 2023-09-01 | Discharge: 2023-09-01 | Disposition: A | Discharge status: Home / Self Care | Payer: Medicare Other | Source: Ambulatory Visit | Attending: Nurse Practitioner | Admitting: Nurse Practitioner

## 2023-09-01 DIAGNOSIS — R197 Diarrhea, unspecified: Secondary | ICD-10-CM | POA: Diagnosis not present

## 2023-09-01 DIAGNOSIS — R110 Nausea: Secondary | ICD-10-CM | POA: Diagnosis not present

## 2023-09-01 DIAGNOSIS — K5904 Chronic idiopathic constipation: Secondary | ICD-10-CM

## 2023-09-01 DIAGNOSIS — K5900 Constipation, unspecified: Secondary | ICD-10-CM | POA: Diagnosis not present

## 2023-09-01 DIAGNOSIS — R1030 Lower abdominal pain, unspecified: Secondary | ICD-10-CM | POA: Diagnosis not present

## 2023-09-01 DIAGNOSIS — R140 Abdominal distension (gaseous): Secondary | ICD-10-CM | POA: Diagnosis not present

## 2023-09-01 DIAGNOSIS — I517 Cardiomegaly: Secondary | ICD-10-CM | POA: Diagnosis not present

## 2023-09-01 LAB — CBC
HCT: 45.3 % (ref 36.0–46.0)
Hemoglobin: 15.3 g/dL — ABNORMAL HIGH (ref 12.0–15.0)
MCHC: 33.7 g/dL (ref 30.0–36.0)
MCV: 95.5 fL (ref 78.0–100.0)
Platelets: 256 10*3/uL (ref 150.0–400.0)
RBC: 4.74 Mil/uL (ref 3.87–5.11)
RDW: 13.1 % (ref 11.5–15.5)
WBC: 4.7 10*3/uL (ref 4.0–10.5)

## 2023-09-01 LAB — COMPREHENSIVE METABOLIC PANEL
ALT: 12 U/L (ref 0–35)
AST: 15 U/L (ref 0–37)
Albumin: 4.4 g/dL (ref 3.5–5.2)
Alkaline Phosphatase: 70 U/L (ref 39–117)
BUN: 14 mg/dL (ref 6–23)
CO2: 30 meq/L (ref 19–32)
Calcium: 9.9 mg/dL (ref 8.4–10.5)
Chloride: 97 meq/L (ref 96–112)
Creatinine, Ser: 1 mg/dL (ref 0.40–1.20)
GFR: 54.01 mL/min — ABNORMAL LOW (ref >60.00)
Glucose, Bld: 100 mg/dL — ABNORMAL HIGH (ref 70–99)
Potassium: 5 meq/L (ref 3.5–5.1)
Sodium: 134 meq/L — ABNORMAL LOW (ref 135–145)
Total Bilirubin: 0.9 mg/dL (ref 0.2–1.2)
Total Protein: 6.9 g/dL (ref 6.0–8.3)

## 2023-09-01 LAB — C-REACTIVE PROTEIN: CRP: <1 mg/dL (ref 0.5–20.0)

## 2023-09-02 ENCOUNTER — Emergency Department (HOSPITAL_BASED_OUTPATIENT_CLINIC_OR_DEPARTMENT_OTHER)
Admission: EM | Admit: 2023-09-02 | Discharge: 2023-09-02 | Disposition: A | Discharge status: Home / Self Care | Payer: Medicare Other | Attending: Emergency Medicine | Admitting: Emergency Medicine

## 2023-09-02 ENCOUNTER — Encounter (HOSPITAL_BASED_OUTPATIENT_CLINIC_OR_DEPARTMENT_OTHER): Payer: Self-pay

## 2023-09-02 ENCOUNTER — Emergency Department (HOSPITAL_BASED_OUTPATIENT_CLINIC_OR_DEPARTMENT_OTHER): Payer: Medicare Other

## 2023-09-02 DIAGNOSIS — R110 Nausea: Secondary | ICD-10-CM | POA: Insufficient documentation

## 2023-09-02 DIAGNOSIS — R197 Diarrhea, unspecified: Secondary | ICD-10-CM | POA: Diagnosis not present

## 2023-09-02 DIAGNOSIS — K5900 Constipation, unspecified: Secondary | ICD-10-CM | POA: Diagnosis not present

## 2023-09-02 DIAGNOSIS — R1084 Generalized abdominal pain: Secondary | ICD-10-CM | POA: Diagnosis not present

## 2023-09-02 DIAGNOSIS — I10 Essential (primary) hypertension: Secondary | ICD-10-CM | POA: Insufficient documentation

## 2023-09-02 DIAGNOSIS — R109 Unspecified abdominal pain: Secondary | ICD-10-CM | POA: Insufficient documentation

## 2023-09-02 DIAGNOSIS — K5730 Diverticulosis of large intestine without perforation or abscess without bleeding: Secondary | ICD-10-CM | POA: Diagnosis not present

## 2023-09-02 DIAGNOSIS — R630 Anorexia: Secondary | ICD-10-CM | POA: Insufficient documentation

## 2023-09-02 LAB — URINALYSIS, W/ REFLEX TO CULTURE (INFECTION SUSPECTED)
Bacteria, UA: NONE SEEN
Bilirubin Urine: NEGATIVE
Glucose, UA: NEGATIVE mg/dL
Hgb urine dipstick: NEGATIVE
Nitrite: NEGATIVE
Protein, ur: NEGATIVE mg/dL
Specific Gravity, Urine: 1.02 (ref 1.005–1.030)
pH: 6.5 (ref 5.0–8.0)

## 2023-09-02 LAB — CBC WITH DIFFERENTIAL/PLATELET
Abs Immature Granulocytes: 0.01 10*3/uL (ref 0.00–0.07)
Basophils Absolute: 0 10*3/uL (ref 0.0–0.1)
Basophils Relative: 0 %
Eosinophils Absolute: 0 10*3/uL (ref 0.0–0.5)
Eosinophils Relative: 0 %
HCT: 42.1 % (ref 36.0–46.0)
Hemoglobin: 14.9 g/dL (ref 12.0–15.0)
Immature Granulocytes: 0 %
Lymphocytes Relative: 19 %
Lymphs Abs: 0.8 10*3/uL (ref 0.7–4.0)
MCH: 32.1 pg (ref 26.0–34.0)
MCHC: 35.4 g/dL (ref 30.0–36.0)
MCV: 90.7 fL (ref 80.0–100.0)
Monocytes Absolute: 0.3 10*3/uL (ref 0.1–1.0)
Monocytes Relative: 7 %
Neutro Abs: 3 10*3/uL (ref 1.7–7.7)
Neutrophils Relative %: 74 %
Platelets: 236 10*3/uL (ref 150–400)
RBC: 4.64 MIL/uL (ref 3.87–5.11)
RDW: 12 % (ref 11.5–15.5)
WBC: 4.1 10*3/uL (ref 4.0–10.5)
nRBC: 0 % (ref 0.0–0.2)

## 2023-09-02 LAB — COMPREHENSIVE METABOLIC PANEL
ALT: 13 U/L (ref 0–44)
AST: 15 U/L (ref 15–41)
Albumin: 4.6 g/dL (ref 3.5–5.0)
Alkaline Phosphatase: 73 U/L (ref 38–126)
Anion gap: 8 (ref 5–15)
BUN: 14 mg/dL (ref 8–23)
CO2: 27 mmol/L (ref 22–32)
Calcium: 9.6 mg/dL (ref 8.9–10.3)
Chloride: 98 mmol/L (ref 98–111)
Creatinine, Ser: 0.84 mg/dL (ref 0.44–1.00)
GFR, Estimated: >60 mL/min (ref >60)
Glucose, Bld: 101 mg/dL — ABNORMAL HIGH (ref 70–99)
Potassium: 4.3 mmol/L (ref 3.5–5.1)
Sodium: 133 mmol/L — ABNORMAL LOW (ref 135–145)
Total Bilirubin: 0.9 mg/dL (ref <1.2)
Total Protein: 7.3 g/dL (ref 6.5–8.1)

## 2023-09-02 LAB — TROPONIN I (HIGH SENSITIVITY): Troponin I (High Sensitivity): 7 ng/L (ref <18)

## 2023-09-02 LAB — LIPASE, BLOOD: Lipase: 28 U/L (ref 11–51)

## 2023-09-02 MED ORDER — ONDANSETRON 4 MG PO TBDP: 4.0000 mg | ORAL_TABLET | Freq: Three times a day (TID) | ORAL | 0 refills | Status: DC | PRN

## 2023-09-02 MED ORDER — LACTATED RINGERS IV BOLUS
1000.0000 mL | Freq: Once | INTRAVENOUS | Status: AC
  Administered 2023-09-02: 1000 mL via INTRAVENOUS

## 2023-09-02 MED ORDER — ONDANSETRON HCL 4 MG/2ML IJ SOLN
4.0000 mg | Freq: Once | INTRAMUSCULAR | Status: AC
  Administered 2023-09-02: 4 mg via INTRAVENOUS
  Filled 2023-09-02: qty 2

## 2023-09-02 MED ORDER — IOHEXOL 300 MG/ML  SOLN
80.0000 mL | Freq: Once | INTRAMUSCULAR | Status: AC | PRN
  Administered 2023-09-02: 80 mL via INTRAVENOUS

## 2023-09-02 NOTE — ED Triage Notes (Signed)
She c/o nausea with anorexia for past several days. Saw pcp this week and blood work was performed yesterday "but the tests aren't back yet". She is in no distress.

## 2023-09-02 NOTE — ED Notes (Signed)
I gave a detailed teaching session on the importance of follow up wit hher pcp for issues with her b/p.

## 2023-09-02 NOTE — ED Provider Notes (Signed)
  [place] EMERGENCY DEPARTMENT AT [facility] Provider Note   CSN: [id number] Arrival date & time: 09/02/23  9323     History {Add pertinent medical, surgical, social history, OB history to HPI:1} Chief Complaint  Patient presents with   Nausea    Elizabeth Wilkins is a 77 y.o. female.  HPI     77 year old female with a history of hypertension, hyperlipidemia, constipation, diverticulosis  Has not been able to eat, not hungry. Did eat some crackers. Been drinking gingerale and water. When eat a little bit of crackers feels the need to use the bathroom, happens with drinking water too.  Has some gas and a little diarrhea, like a tablespoon. Not really going.  Low appetite, 5 days Nausea, no vomiting Constipation for a long time No rectal pressure Chills No fever or urinary symptoms No cp or dyspnea Abdominal discomfort, lots of churning, sometimes pain, last night knife like and then improved, across the whole abdomen Linzess sometimes works sometimes doesn,t increased the dose then began to have nausea and still couldn't have true BM  Past Medical History:  Diagnosis Date   Heart murmur    Hyperlipidemia    Hypertension    Osteopenia    Osteoporosis     Home Medications Prior to Admission medications   Medication Sig Start Date End Date Taking? Authorizing Provider  linaclotide (LINZESS) 145 MCG CAPS capsule Take 1 capsule (145 mcg total) by mouth daily. Take 30 minutes before breakfast. 08/22/23 08/16/24  Napoleon Form, MD  PARoxetine (PAXIL) 20 MG tablet Take 1 tablet (20 mg total) by mouth daily. Patient taking differently: Take 10 mg by mouth daily. 07/11/22   Pincus Sanes, MD  [facility] injection Inject 0.5 mLs into the muscle once. 10/07/22   [provider]      Allergies    Clonazepam, Fosamax [alendronate], and Morphine    Review of Systems   Review of Systems  Physical Exam Updated Vital Signs BP 122/73 (BP Location: Left  Arm)   Pulse 70   Temp 98.7 F (37.1 C)   Resp 17   SpO2 97%  Physical Exam  ED Results / Procedures / Treatments   Labs (all labs ordered are listed, but only abnormal results are displayed) Labs Reviewed - No data to display  EKG None  Radiology No results found.  Procedures Procedures  {Document cardiac monitor, telemetry assessment procedure when appropriate:1}  Medications Ordered in ED Medications - No data to display  ED Course/ Medical Decision Making/ A&P   {   Click here for ABCD2, HEART and other calculatorsREFRESH Note before signing :1}                              Medical Decision Making Amount and/or Complexity of Data Reviewed Labs: ordered.  Risk Prescription drug management.   ***  {Document critical care time when appropriate:1} {Document review of labs and clinical decision tools ie heart score, Chads2Vasc2 etc:1}  {Document your independent review of radiology images, and any outside records:1} {Document your discussion with family members, caretakers, and with consultants:1} {Document social determinants of health affecting pt's care:1} {Document your decision making why or why not admission, treatments were needed:1} Final Clinical Impression(s) / ED Diagnoses Final diagnoses:  None    Rx / DC Orders ED Discharge Orders     None

## 2023-09-04 ENCOUNTER — Telehealth: Payer: Self-pay | Admitting: Internal Medicine

## 2023-09-04 NOTE — Telephone Encounter (Signed)
Patient has appointment on tomorrow. 

## 2023-09-04 NOTE — Progress Notes (Unsigned)
    Subjective:    Patient ID: Elizabeth Wilkins, female    DOB: 05-Jul-1946, 77 y.o.   MRN: [id number]      HPI Elizabeth Wilkins is here for No chief complaint on file.        Medications and allergies reviewed with patient and updated if appropriate.  Current Outpatient Medications on File Prior to Visit  Medication Sig Dispense Refill   linaclotide (LINZESS) 145 MCG CAPS capsule Take 1 capsule (145 mcg total) by mouth daily. Take 30 minutes before breakfast. 30 capsule 1   ondansetron (ZOFRAN-ODT) 4 MG disintegrating tablet Take 1 tablet (4 mg total) by mouth every 8 (eight) hours as needed for nausea or vomiting. 12 tablet 0   PARoxetine (PAXIL) 20 MG tablet Take 1 tablet (20 mg total) by mouth daily. (Patient taking differently: Take 10 mg by mouth daily.) 90 tablet 3   SHINGRIX injection Inject 0.5 mLs into the muscle once.     No current facility-administered medications on file prior to visit.    Review of Systems     Objective:  There were no vitals filed for this visit. BP Readings from Last 3 Encounters:  09/02/23 (!) 182/79  06/27/23 120/70  03/13/23 122/80   Wt Readings from Last 3 Encounters:  06/27/23 175 lb 12.8 oz (79.7 kg)  03/13/23 174 lb 9.6 oz (79.2 kg)  11/30/22 180 lb (81.6 kg)   There is no height or weight on file to calculate BMI.    Physical Exam         Assessment & Plan:    See Problem List for Assessment and Plan of chronic medical problems.

## 2023-09-04 NOTE — Telephone Encounter (Signed)
Pt was seen at the ER this weekend and pt has a urinary tract infection and wanting to know if Dr. Lawerance Bach will prescribe her something. Please advise.

## 2023-09-05 ENCOUNTER — Encounter: Payer: Self-pay | Admitting: Internal Medicine

## 2023-09-05 ENCOUNTER — Ambulatory Visit (INDEPENDENT_AMBULATORY_CARE_PROVIDER_SITE_OTHER): Payer: Medicare Other | Admitting: Internal Medicine

## 2023-09-05 VITALS — BP 140/74 | HR 71 | Temp 98.7°F | Ht 69.0 in | Wt 168.0 lb

## 2023-09-05 DIAGNOSIS — R109 Unspecified abdominal pain: Secondary | ICD-10-CM | POA: Diagnosis not present

## 2023-09-05 DIAGNOSIS — Z136 Encounter for screening for cardiovascular disorders: Secondary | ICD-10-CM

## 2023-09-05 DIAGNOSIS — F419 Anxiety disorder, unspecified: Secondary | ICD-10-CM

## 2023-09-05 DIAGNOSIS — I10 Essential (primary) hypertension: Secondary | ICD-10-CM

## 2023-09-05 MED ORDER — SERTRALINE HCL 25 MG PO TABS: 25.0000 mg | ORAL_TABLET | Freq: Every day | ORAL | 5 refills | Status: DC

## 2023-09-05 NOTE — Assessment & Plan Note (Addendum)
Chronic Situational mostly Discussed possibly alprazolam or buspirone for as needed, but she is concerned about possible side effects Will try sertraline 25 mg daily-discussed she can take this on a daily basis, but can try taking it intermittently ever as needed as she did with the Paxil which seemed to work fairly well with her

## 2023-09-05 NOTE — Assessment & Plan Note (Signed)
Chronic Blood pressure typically controlled Minimally elevated here today, but was much higher in the ED which is understandable Will hold off on medication Controlled with lifestyle Advised her to monitor her BP at home

## 2023-09-05 NOTE — Assessment & Plan Note (Signed)
Acute Pain across mid abdomen No current pain on exam Recent ED visit including CT of the abdomen showed no cause for abdominal pain Having decreased appetite, nausea and likely some increased anxiety regarding abdominal pain and noises in her abdomen which could explain some of her symptoms or so this will about Taking the sertraline on a daily basis may help resume her appetite and help with some of the anxiety and then she can take it as needed Call if symptoms do not improve

## 2023-09-05 NOTE — Patient Instructions (Addendum)
        Medications changes include :   sertraline 25 mg daily      Return if symptoms worsen or fail to improve.

## 2023-09-14 ENCOUNTER — Encounter: Payer: Self-pay | Admitting: Internal Medicine

## 2023-09-14 ENCOUNTER — Ambulatory Visit: Payer: Medicare Other | Admitting: Podiatry

## 2023-09-15 ENCOUNTER — Encounter: Payer: Self-pay | Admitting: Gastroenterology

## 2023-09-15 ENCOUNTER — Ambulatory Visit (INDEPENDENT_AMBULATORY_CARE_PROVIDER_SITE_OTHER): Payer: Medicare Other | Admitting: Gastroenterology

## 2023-09-15 VITALS — BP 122/70 | HR 68 | Ht 68.0 in | Wt 164.2 lb

## 2023-09-15 DIAGNOSIS — K5904 Chronic idiopathic constipation: Secondary | ICD-10-CM

## 2023-09-15 DIAGNOSIS — R140 Abdominal distension (gaseous): Secondary | ICD-10-CM

## 2023-09-15 DIAGNOSIS — R109 Unspecified abdominal pain: Secondary | ICD-10-CM

## 2023-09-15 DIAGNOSIS — R1013 Epigastric pain: Secondary | ICD-10-CM

## 2023-09-15 DIAGNOSIS — R634 Abnormal weight loss: Secondary | ICD-10-CM

## 2023-09-15 DIAGNOSIS — F419 Anxiety disorder, unspecified: Secondary | ICD-10-CM | POA: Diagnosis not present

## 2023-09-15 DIAGNOSIS — K5909 Other constipation: Secondary | ICD-10-CM | POA: Diagnosis not present

## 2023-09-15 DIAGNOSIS — K581 Irritable bowel syndrome with constipation: Secondary | ICD-10-CM

## 2023-09-15 NOTE — Patient Instructions (Addendum)
VISIT SUMMARY:  During your visit, we discussed your recent severe episode of constipation, which led to significant discomfort and weight loss. We also addressed your anxiety, which may be contributing to your digestive issues. You have been managing your chronic constipation with Linzess, but recently experienced a worsening of symptoms despite increasing the dosage. You visited the ER, where no blockage was found, and you were advised to resume your original Linzess dosage and start Miralax. Since then, your symptoms have improved, but you have lost 10 pounds due to reduced food intake. We have developed a plan to help manage your constipation and anxiety moving forward.  YOUR PLAN:  -CHRONIC CONSTIPATION: Chronic constipation is a long-term condition where you have infrequent bowel movements or difficulty passing stools. To manage this, continue taking Linzess daily. If your constipation worsens, you can take an additional dose or switch to the higher dose ( ) that you have at home. If constipation persists for more than 2 days, use a Dulcolax suppository. If it continues for more than 3-4 days, perform a bowel purge with Miralax. Make sure to stay hydrated by drinking at least 8 cups of water daily and maintain regular meals. We will provide samples of FDgard and IBgard (peppermint oil capsules) to help with digestive symptoms, which you can use as needed. Follow up in 2-3 months to ensure your constipation is well-managed.  -ANXIETY: Anxiety is a feeling of worry or fear that can affect your daily activities. You have been experiencing significant anxiety related to your constipation, which has led to avoiding eating. Continue taking Zoloft and we will reassess its effectiveness at your next visit. Try to maintain regular meals despite your anxiety about constipation.  -WEIGHT LOSS: You have lost 10 pounds over the past month due to severe constipation and avoiding eating. It is important  to resume regular meals and stay hydrated to prevent further weight loss. We will monitor your weight at your follow-up visit.  INSTRUCTIONS:  Follow up in 2-3 months to ensure your constipation is well-managed and to reassess the effectiveness of Zoloft for your anxiety. If your constipation worsens or persists, follow the outlined steps for managing it, including using Dulcolax suppositories or performing a bowel purge with Miralax if necessary.   Dr Lavon Paganini  recommends that you complete a bowel purge (to clean out your bowels). Please do the following: Purchase a bottle of Miralax over the counter as well as a box of 5 mg dulcolax tablets. Take 4 dulcolax tablets. Wait 1 hour. You will then drink 6-8 capfuls of Miralax mixed in an adequate amount of water/juice/gatorade (you may choose which of these liquids to drink) over the next 2-3 hours. You should expect results within 1 to 6 hours after completing the bowel purge.  Follow up in 2-3 months with Dr Lavon Paganini. Office will contact you to schedule.   Thank you for choosing me and [facility].  Dr. Marsa Aris

## 2023-09-15 NOTE — Progress Notes (Unsigned)
Elizabeth Wilkins    [id number]    12-Jul-1946  Primary Care Physician:Burns, Bobette Mo, MD  Referring Physician: Pincus Sanes, MD [address] [place],  [place] [address]   Chief complaint:  Abd pain, constipation, nausea  Discussed the use of AI scribe software for clinical note transcription with the patient, who gave verbal consent to proceed.  History of Present Illness   The patient, with a history of chronic constipation managed with Linzess, presents with a significant exacerbation of their symptoms. They report a period of several days without bowel movements, despite taking their usual medication. This prompted an increase in their Linzess dosage, which unfortunately did not provide the desired relief. The patient describes a growing sense of panic and discomfort, leading to a decision to stop eating due to concerns about further exacerbating their symptoms.  The patient's discomfort was primarily localized to their abdomen, with a generalized sensation of pain and pressure. They were concerned about a potential blockage, which led to an ER visit. The ER visit did not reveal a blockage, but the patient continued to experience significant discomfort, primarily manifesting as painful gas and diarrhea, without the passage of stool.  The patient attempted to manage their symptoms with Miralax, as advised by a healthcare provider, but found no relief. They then resumed their original Linzess dosage, which has since improved their symptoms. However, the patient reports a significant weight loss of approximately 10 pounds over the course of four weeks due to reduced food intake.  The patient also mentions a history of anxiety, which they believe may be contributing to their digestive issues. They have recently started taking Zoloft to manage their anxiety symptoms. The patient expresses a strong desire to avoid a recurrence of their recent severe constipation and is open to  exploring additional treatment options.     CT abd pelvis with contrast 09/02/23 1. No acute CT findings of the abdomen or pelvis to explain abdominal pain, nausea, or anorexia. 2. Sigmoid diverticulosis without evidence of acute diverticulitis. 3. Coronary artery disease.    Colonoscopy December 31, 2019 - Three 1 to 2 mm polyps [hyperplastic] in the rectum, in the descending colon and in the transverse colon, removed with a cold biopsy forceps. Resected and retrieved. - Diverticulosis in the sigmoid colon. - Non-bleeding internal hemorrhoids.   Colonoscopy in November 2014 by Dr. Leone Payor with excellent prep with findings of sigmoid diverticulosis and was normal exam otherwise, prior to this she had a normal colonoscopy in 2004.     Outpatient Encounter Medications as of 09/15/2023  Medication Sig   acetaminophen (TYLENOL) 500 MG tablet Take 1,000 mg by mouth as needed.   chlorhexidine (PERIDEX) 0.12 % solution RINSE 1/2OZ TWICE A DAY AFTER BREAKFAST & AT BEDTIME FOR 1 MIN AFTER BRUSHING AND FLOSSING. SPIT OUT   clobetasol (TEMOVATE) 0.05 % external solution PLEASE SEE ATTACHED FOR DETAILED DIRECTIONS   linaclotide (LINZESS) 145 MCG CAPS capsule Take 1 capsule (145 mcg total) by mouth daily. Take 30 minutes before breakfast.   sertraline (ZOLOFT) 25 MG tablet Take 1 tablet (25 mg total) by mouth daily.   ondansetron (ZOFRAN-ODT) 4 MG disintegrating tablet Take 1 tablet (4 mg total) by mouth every 8 (eight) hours as needed for nausea or vomiting. (Patient not taking: Reported on 09/15/2023)   No facility-administered encounter medications on file as of 09/15/2023.    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   Procedure: TOTAL HIP ARTHROPLASTY ANTERIOR APPROACH AND ABDUCTOR TENDON REPAIR;  Surgeon: Samson Frederic, MD;  Location: [facility];  Service: Orthopedics;  Laterality: Right;   TUBAL LIGATION      Family History  Problem Relation Age of Onset   Pancreatic cancer Mother 72   Hypertension Mother    Hyperlipidemia Mother    Rectal cancer Brother 94   Colon cancer Brother 78   Lupus Father 33   Stomach cancer Neg Hx    Colon polyps Neg Hx    Esophageal cancer Neg Hx     Social History   Socioeconomic History   Marital status: Married    Spouse name: Not on file   Number of children: 3   Years of education: Not on file   Highest education level: Not on file  Occupational History   Occupation: retired  Tobacco Use   Smoking status: Never   Smokeless tobacco: Never  Vaping Use   Vaping status: Never Used  Substance and Sexual Activity   Alcohol use: Yes    Alcohol/week: 7.0 standard drinks of alcohol    Types: 7 Shots of liquor per week   Drug use: No   Sexual activity: Not on file  Other Topics Concern   Not on file  Social History Narrative   Pt moved from [place] in 2009 and works as Scientist, 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      No regular exercise   Social Drivers of Corporate investment banker Strain: Not on file  Food Insecurity: Not on file  Transportation Needs: Not on file  Physical Activity: Not on file  Stress: Not on file  Social Connections: Not on file  Intimate Partner Violence: Not on file      Review of systems: All other review of systems negative except as mentioned in the HPI.   Physical Exam: Vitals:   09/15/23 1533  BP: 122/70  Pulse: 68   Body mass index is 24.97 kg/m. Gen:       No acute distress HEENT:  sclera anicteric CV: s1s2 rrr, no murmur Lungs: B/l clear. Abd:      soft, non-tender; no palpable masses, no distension Ext:    No edema Neuro: alert and oriented x 3 Psych: normal mood and affect  Data Reviewed:  Reviewed labs, radiology imaging, old records and pertinent past GI work up     Assessment and Plan    Chronic Constipation Recent severe episode of constipation with abdominal pain and bloating. Patient has been on Linzess for 4 years. She stopped taking it for several days and then resumed at a higher dose without relief. She then stopped eating due to fear of worsening constipation. She was seen in the ER and advised to resume Linzess and start Miralax. She has since resumed her original dose of Linzess daily and is  having bowel movements again. She lost 10 pounds during this episode. -Continue Linzess daily. If constipation worsens, she can take an additional dose or switch to the higher dose ( ) that she has at home. -If constipation persists for more than 2 days, use a Dulcolax suppository. If constipation persists for more than 3-4 days, perform a bowel purge with Miralax (instructions to be provided). -Encouraged to maintain adequate hydration (at least 8 cups of water daily) and regular meals to prevent constipation. -Will provide samples of FDgard and IBgard (peppermint oil capsules) to help with digestive symptoms. She can use these as needed. -Follow-up in 2-3 months to ensure constipation is well-managed.  Anxiety Patient reports significant anxiety related to her constipation, to the point of avoiding eating. She has been on Zoloft for 2 weeks but has not noticed a difference yet. -Continue Zoloft and reassess efficacy at next visit. -Encouraged to maintain regular meals despite anxiety about constipation.  Weight Loss Patient lost 10 pounds over the past month due to severe constipation and subsequent avoidance  of eating. -Encouraged to resume regular meals and hydration to prevent further weight loss. -Will monitor weight at follow-up visit.        This visit required >30 minutes of patient care (this includes precharting, chart review, review of results, face-to-face time used for counseling as well as treatment plan and follow-up. The patient was provided an opportunity to ask questions and all were answered. The patient agreed with the plan and demonstrated an understanding of the instructions.  Iona Beard , MD    CC: Pincus Sanes, MD

## 2023-09-19 ENCOUNTER — Ambulatory Visit: Payer: Self-pay | Admitting: Internal Medicine

## 2023-09-19 NOTE — Telephone Encounter (Signed)
Copied from CRM [id number]. Topic: Clinical - Red Word Triage >> Sep 19, 2023 12:57 PM Corin V wrote: Red Word that prompted transfer to Nurse Triage: Zoloft not agreeing with her and symptoms not improving. Feels like she is in a fog and worried she is having an adverse reaction.   Chief Complaint: Medication side effect Symptoms: Brain fog, loss of appetite  Frequency: Constant  Pertinent Negatives: Patient denies any other symptom at this time Disposition: [] ED /[] Urgent Care (no appt availability in office) / [] Appointment(In office/virtual)/ []  [facility]/ [] Home Care/ [] Refused Recommended Disposition /[] [facility]/ [x]  Follow-up with PCP Additional Notes: Patient reports she began taking Zoloft 2 weeks ago. She reports she is now experiencing brain fog and loss of appetite and wanted to know if these were side effects of the medication. I advised the patient that they are listed as side effects. Patient asking if she should stop taking the medication. I advised that I would forward this on to the office for the doctor to evaluate and that someone would contact her after the holiday. Patient understood and is agreeable with this plan.    Reason for Disposition  [1] Caller has NON-URGENT medicine question about med that PCP prescribed AND [2] triager unable to answer question  Answer Assessment - Initial Assessment Questions 1. NAME of MEDICINE: "What medicine(s) are you calling about?"     Zoloft 2. QUESTION: "What is your question?" (e.g., double dose of medicine, side effect)     Side effects 3. PRESCRIBER: "Who prescribed the medicine?" Reason: if prescribed by specialist, call should be referred to that group.     Dr. Cheryll Cockayne 4. SYMPTOMS: "Do you have any symptoms?" If Yes, ask: "What symptoms are you having?"  "How bad are the symptoms (e.g., mild, moderate, severe)     Brain fog, loss of appetite  5. PREGNANCY:  "Is there any chance that you are  pregnant?" "When was your last menstrual period?"     No  Protocols used: Medication Question Call-A-AH

## 2023-09-21 ENCOUNTER — Encounter: Payer: Self-pay | Admitting: Gastroenterology

## 2023-09-24 NOTE — Telephone Encounter (Signed)
Stop medication.   See how she feels w/o the medication and if she feels like she still needs something we can try something different but lets first make sure her side effects go away.

## 2023-09-25 NOTE — Telephone Encounter (Signed)
Message left for patient and my-chart message sent as well with Dr. Lawerance Bach recommendations.

## 2023-09-25 NOTE — Telephone Encounter (Signed)
Pt returned call during lunch. Read pt Elizabeth Wilkins's message from mychart.  Please call her back at:[phone]

## 2023-09-28 ENCOUNTER — Other Ambulatory Visit: Payer: Self-pay | Admitting: Internal Medicine

## 2023-09-29 NOTE — Telephone Encounter (Signed)
 Message left for patient to return call today in follow up from previous message.   If she calls back please see how she is feeling today after holding the Zoloft for a couple of days and if she wants to try different medication in replacement.

## 2023-09-29 NOTE — Telephone Encounter (Signed)
 Copied from CRM [id number]. Topic: Clinical - Medication Question >> Sep 29, 2023  4:10 PM [name] wrote: Reason for CRM: Patient wants to let Elizabeth Wilkins know that she started back on the Paxil, if she shouldn't, she asked for a call to let her know

## 2023-09-30 MED ORDER — PAROXETINE HCL 10 MG PO TABS: 10.0000 mg | ORAL_TABLET | Freq: Every day | ORAL | Status: DC

## 2023-10-06 ENCOUNTER — Telehealth: Payer: Self-pay | Admitting: Gastroenterology

## 2023-10-06 NOTE — Telephone Encounter (Signed)
 Reason for CRM: Patient is requesting a callback regarding ZOLOFT medication.

## 2023-10-06 NOTE — Telephone Encounter (Signed)
 PT is calling with severe stomach pain and nausea.She cannot keep any food down either and Is very concerned. Please advise.

## 2023-10-06 NOTE — Telephone Encounter (Signed)
 Above the belly button pain that pt states is similar to what she has had in the past. Some worse today. She had yogurt then hour later the pain began.   Takes linzess  for constipation daily.  Last BM yesterday-loose to watery.  Pt feels full faster.  She is able to eat and drink today. The pain is pretty constant.  She has not tried FD or IBgard yet.  She is going to try the samples that were given to her and see if that helps her abd pain.  I will also send to Dr Nandigam for review.

## 2023-10-09 ENCOUNTER — Ambulatory Visit: Payer: Medicare Other | Admitting: Podiatry

## 2023-10-12 ENCOUNTER — Ambulatory Visit: Payer: 59 | Admitting: Internal Medicine

## 2023-10-12 ENCOUNTER — Telehealth: Payer: Self-pay | Admitting: Gastroenterology

## 2023-10-12 NOTE — Telephone Encounter (Signed)
PT is calling to have a nurse contact her to discuss symptoms. She is on Linzess and every time she takes it she gets diarrhea along with stomach pain. Please advise.

## 2023-10-13 NOTE — Telephone Encounter (Signed)
Left message on machine to call back  

## 2023-10-17 ENCOUNTER — Encounter: Payer: Self-pay | Admitting: Gastroenterology

## 2023-10-18 NOTE — Telephone Encounter (Signed)
Patient reports 1 normal bowel movement in the morning after taking Linzess. She will then have 2 to 3 more bowel movements that become progressively more loose. By then she refuses to eat because she is afraid of creating more diarrhea. She confirms she is taking on the Linzess 72 mcg daily for her constipation. Please advise.

## 2023-10-18 NOTE — Telephone Encounter (Signed)
Please advise patient to take Linzess only as needed if she is not having a BM daily but if develops diarrhea stop it. Will plan to switch to alternate laxative for constipation

## 2023-10-19 DIAGNOSIS — L218 Other seborrheic dermatitis: Secondary | ICD-10-CM | POA: Diagnosis not present

## 2023-10-19 NOTE — Telephone Encounter (Signed)
Called the patient. No answer. Left a message to call me back when she is available.

## 2023-10-19 NOTE — Telephone Encounter (Signed)
PT returning call. Please advise.

## 2023-10-19 NOTE — Telephone Encounter (Signed)
Inbound call from patient, returning call.

## 2023-10-20 NOTE — Telephone Encounter (Signed)
Patient will try this plan using the Linzess PRN.

## 2023-10-20 NOTE — Telephone Encounter (Signed)
Patient returning call. Please advise. Thank you.

## 2023-10-25 NOTE — Telephone Encounter (Signed)
Called the patient. No answer. Left a message of my call inquiring on her progress with taking Linzess PRN.

## 2023-11-02 ENCOUNTER — Telehealth: Payer: Self-pay | Admitting: Internal Medicine

## 2023-11-02 MED ORDER — AMOXICILLIN 500 MG PO CAPS: ORAL_CAPSULE | ORAL | 0 refills | Status: AC

## 2023-11-02 NOTE — Telephone Encounter (Signed)
 Sent to [facility]

## 2023-11-02 NOTE — Telephone Encounter (Signed)
 Copied from CRM [id number]. Topic: Clinical - Medication Question >> Nov 02, 2023 11:03 AM Laymon HERO wrote: Reason for CRM: Patient looking for a pre med for a dentist appointment for ammoxicilian. Please reach out to patient> dentist said that she needed to contact her PCP for this

## 2023-11-22 ENCOUNTER — Ambulatory Visit (HOSPITAL_BASED_OUTPATIENT_CLINIC_OR_DEPARTMENT_OTHER): Payer: Medicare Other | Admitting: Cardiology

## 2023-11-22 ENCOUNTER — Encounter (HOSPITAL_BASED_OUTPATIENT_CLINIC_OR_DEPARTMENT_OTHER): Payer: Self-pay | Admitting: Cardiology

## 2023-11-22 VITALS — BP 130/90 | HR 62 | Ht 70.0 in | Wt 168.5 lb

## 2023-11-22 DIAGNOSIS — Z712 Person consulting for explanation of examination or test findings: Secondary | ICD-10-CM

## 2023-11-22 DIAGNOSIS — Z7189 Other specified counseling: Secondary | ICD-10-CM

## 2023-11-22 DIAGNOSIS — E7800 Pure hypercholesterolemia, unspecified: Secondary | ICD-10-CM

## 2023-11-22 DIAGNOSIS — I700 Atherosclerosis of aorta: Secondary | ICD-10-CM | POA: Diagnosis not present

## 2023-11-22 DIAGNOSIS — I2510 Atherosclerotic heart disease of native coronary artery without angina pectoris: Secondary | ICD-10-CM | POA: Diagnosis not present

## 2023-11-22 NOTE — Patient Instructions (Signed)
 Medication Instructions:  No changes *If you need a refill on your cardiac medications before your next appointment, please call your pharmacy*  Lab Work: No labs If you have labs (blood work) drawn today and your tests are completely normal, you will receive your results only by: MyChart Message (if you have MyChart) OR A paper copy in the mail If you have any lab test that is abnormal or we need to change your treatment, we will call you to review the results.  Testing/Procedures: No testing  Follow-Up: At [facility], you and your health needs are our priority.  As part of our continuing mission to provide you with exceptional heart care, we have created designated Provider Care Teams.  These Care Teams include your primary Cardiologist (physician) and Advanced Practice Providers (APPs -  Physician Assistants and Nurse Practitioners) who all work together to provide you with the care you need, when you need it.  We recommend signing up for the patient portal called "MyChart".  Sign up information is provided on this After Visit Summary.  MyChart is used to connect with patients for Virtual Visits (Telemedicine).  Patients are able to view lab/test results, encounter notes, upcoming appointments, etc.  Non-urgent messages can be sent to your provider as well.   To learn more about what you can do with MyChart, go to [email or web address].    Your next appointment:   As needed

## 2023-11-22 NOTE — Progress Notes (Signed)
 Cardiology Office Note:  .   Date:  11/22/2023  ID:  Elizabeth Wilkins, DOB 02/15/1946, MRN [id number] PCP: Pincus Sanes, MD  [facility] Providers Cardiologist:  Jodelle Red, MD {  History of Present Illness: .   Elizabeth Wilkins is a 78 y.o. female with PMH hypertension, hyperlipidemia seen as a new consult for abnormal CT results/CV risk.  Today: Here to discuss results of her recent CT. Discussed with Dr. Lawerance Bach, who recommended a calcium score. She comes today to discuss options/management.   We reviewed her actual CT images. There is a very small focal spot of coronary calcification in the partially visualized portion of the heart as well as abdominal aortic calcification.  Has some pressure in her chest at night when she is laying in the bed. No exertional chest pain/pressure. Occasional dizziness but no syncope.  Has been told she has a murmur in the past but told not to worry about it  ROS: Denies chest pain, shortness of breath at rest or with normal exertion. No PND, orthopnea, LE edema or unexpected weight gain. No syncope or palpitations. ROS otherwise negative except as noted.   Studies Reviewed: Marland Kitchen    EKG:       Physical Exam:   VS:  BP (!) 130/90   Pulse 62   Ht 5\' 10"  (1.778 m)   Wt 168 lb 8 oz (76.4 kg)   SpO2 96%   BMI 24.18 kg/m    Wt Readings from Last 3 Encounters:  11/22/23 168 lb 8 oz (76.4 kg)  09/15/23 164 lb 4 oz (74.5 kg)  09/05/23 168 lb (76.2 kg)    GEN: Well nourished, well developed in no acute distress HEENT: Normal, moist mucous membranes NECK: No JVD CARDIAC: regular rhythm, normal S1 and S2, no rubs or gallops. 1/6 systolic murmur. VASCULAR: Radial and DP pulses 2+ bilaterally. No carotid bruits RESPIRATORY:  Clear to auscultation without rales, wheezing or rhonchi  ABDOMEN: Soft, non-tender, non-distended MUSCULOSKELETAL:  Ambulates independently SKIN: Warm and dry, no edema NEUROLOGIC:  Alert and oriented x 3. No focal  neuro deficits noted. PSYCHIATRIC:  Normal affect    ASSESSMENT AND PLAN: .    Coronary artery calcification Aortic atherosclerosis Hypercholesterolemia -We reviewed the calcifications at length, including images as well as the graph showing mortality based on calcium score. We discussed the pathophysiology of cholesterol plaque formation, the role of calcium and why it is a marker, how plaque is key to acute MI/CVA, and how known plaque is managed with medications.   -we discussed Ca score. From my perspective we know she has calcified plaque, and guidelines would recommend aspirin/statin. A calcium score would not change these recommendations, but it would given her a percentile for age/gender/ethnicity. We agreed not to pursue calcium score at this time. -we discussed the data on statins, both in terms of their long term benefit as well as the risk of side effects. Reviewed common misconceptions about statins. Reviewed how we monitor treatment. After shared decision making, patient does not wish to pursue statin. Her husband has not tolerated them and she does not wish to try at this time -discussed guidelines recommending aspirin. After shared decision making, patient does not wish to start as she bruises easily -reviewed red flag warning signs that need immediate medical attention   CV risk counseling and prevention -recommend heart healthy/Mediterranean diet, with whole grains, fruits, vegetable, fish, lean meats, nuts, and olive oil. Limit salt. -recommend moderate walking, 3-5  times/week for 30-50 minutes each session. Aim for at least 150 minutes.week. Goal should be pace of 3 miles/hours, or walking 1.5 miles in 30 minutes -recommend avoidance of tobacco products. Avoid excess alcohol. -ASCVD risk score: The 10-year ASCVD risk score (Arnett DK, et al., 2019) is: 23.4%   Values used to calculate the score:     Age: 53 years     Sex: Female     Is Non-Hispanic African American: No      Diabetic: No     Tobacco smoker: No     Systolic Blood Pressure: 130 mmHg     Is BP treated: No     HDL Cholesterol: 76.9 mg/dL     Total Cholesterol: 192 mg/dL    Dispo: I would be happy to see her back as needed  Signed, Jodelle Red, MD   Jodelle Red, MD, PhD, [facility]  [facility]  [place]  [facility] & Vascular at [employer] at [facility] [address], [address] [place], [place] [address] [phone]

## 2023-12-12 ENCOUNTER — Ambulatory Visit: Payer: 59 | Admitting: Gastroenterology

## 2024-01-10 DIAGNOSIS — H40013 Open angle with borderline findings, low risk, bilateral: Secondary | ICD-10-CM | POA: Diagnosis not present

## 2024-01-10 DIAGNOSIS — H25813 Combined forms of age-related cataract, bilateral: Secondary | ICD-10-CM | POA: Diagnosis not present

## 2024-01-10 DIAGNOSIS — H35373 Puckering of macula, bilateral: Secondary | ICD-10-CM | POA: Diagnosis not present

## 2024-02-01 DIAGNOSIS — Z08 Encounter for follow-up examination after completed treatment for malignant neoplasm: Secondary | ICD-10-CM | POA: Diagnosis not present

## 2024-02-01 DIAGNOSIS — L249 Irritant contact dermatitis, unspecified cause: Secondary | ICD-10-CM | POA: Diagnosis not present

## 2024-02-01 DIAGNOSIS — L80 Vitiligo: Secondary | ICD-10-CM | POA: Diagnosis not present

## 2024-02-01 DIAGNOSIS — L400 Psoriasis vulgaris: Secondary | ICD-10-CM | POA: Diagnosis not present

## 2024-02-01 DIAGNOSIS — L218 Other seborrheic dermatitis: Secondary | ICD-10-CM | POA: Diagnosis not present

## 2024-02-01 DIAGNOSIS — Z85828 Personal history of other malignant neoplasm of skin: Secondary | ICD-10-CM | POA: Diagnosis not present

## 2024-02-15 ENCOUNTER — Telehealth: Payer: Self-pay | Admitting: Internal Medicine

## 2024-02-15 NOTE — Telephone Encounter (Signed)
 Copied from CRM [id number]. Topic: Clinical - Medical Advice >> Feb 15, 2024  3:09 PM Danae Duncans wrote: Reason for CRM: Pt would like to schedule for CT Cardio - pt would like to know where would she go to complete it , she can be reached [phone]

## 2024-02-20 DIAGNOSIS — L2389 Allergic contact dermatitis due to other agents: Secondary | ICD-10-CM | POA: Diagnosis not present

## 2024-02-20 DIAGNOSIS — L923 Foreign body granuloma of the skin and subcutaneous tissue: Secondary | ICD-10-CM | POA: Diagnosis not present

## 2024-02-23 ENCOUNTER — Ambulatory Visit: Admitting: Internal Medicine

## 2024-03-19 DIAGNOSIS — L218 Other seborrheic dermatitis: Secondary | ICD-10-CM | POA: Diagnosis not present

## 2024-03-19 DIAGNOSIS — L820 Inflamed seborrheic keratosis: Secondary | ICD-10-CM | POA: Diagnosis not present

## 2024-03-19 DIAGNOSIS — Z08 Encounter for follow-up examination after completed treatment for malignant neoplasm: Secondary | ICD-10-CM | POA: Diagnosis not present

## 2024-03-19 DIAGNOSIS — L2989 Other pruritus: Secondary | ICD-10-CM | POA: Diagnosis not present

## 2024-03-19 DIAGNOSIS — L309 Dermatitis, unspecified: Secondary | ICD-10-CM | POA: Diagnosis not present

## 2024-03-19 DIAGNOSIS — D692 Other nonthrombocytopenic purpura: Secondary | ICD-10-CM | POA: Diagnosis not present

## 2024-03-19 DIAGNOSIS — L538 Other specified erythematous conditions: Secondary | ICD-10-CM | POA: Diagnosis not present

## 2024-03-19 DIAGNOSIS — Z789 Other specified health status: Secondary | ICD-10-CM | POA: Diagnosis not present

## 2024-03-19 DIAGNOSIS — Z85828 Personal history of other malignant neoplasm of skin: Secondary | ICD-10-CM | POA: Diagnosis not present

## 2024-03-25 ENCOUNTER — Other Ambulatory Visit: Payer: Self-pay | Admitting: Internal Medicine

## 2024-04-02 ENCOUNTER — Telehealth: Payer: Self-pay | Admitting: Gastroenterology

## 2024-04-02 MED ORDER — LINACLOTIDE 72 MCG PO CAPS: 72.0000 ug | ORAL_CAPSULE | Freq: Every day | ORAL | 3 refills | Status: AC

## 2024-04-02 NOTE — Telephone Encounter (Signed)
 PT needs to have a new prescription for Linzess  sent to [facility]. Please advise.

## 2024-04-02 NOTE — Telephone Encounter (Signed)
 Called patient to discuss which Linzess  she needed. She had a Linzess  145 mcg on her list that she requested removal. She had 72 mcg previously and wants to go back to that. Sent Linzess  72 mcg to [facility] as patient requested

## 2024-04-03 ENCOUNTER — Ambulatory Visit

## 2024-04-09 DIAGNOSIS — H0279 Other degenerative disorders of eyelid and periocular area: Secondary | ICD-10-CM | POA: Diagnosis not present

## 2024-04-09 DIAGNOSIS — H02413 Mechanical ptosis of bilateral eyelids: Secondary | ICD-10-CM | POA: Diagnosis not present

## 2024-04-09 DIAGNOSIS — H02411 Mechanical ptosis of right eyelid: Secondary | ICD-10-CM | POA: Diagnosis not present

## 2024-04-09 DIAGNOSIS — H57813 Brow ptosis, bilateral: Secondary | ICD-10-CM | POA: Diagnosis not present

## 2024-04-09 DIAGNOSIS — H02834 Dermatochalasis of left upper eyelid: Secondary | ICD-10-CM | POA: Diagnosis not present

## 2024-04-09 DIAGNOSIS — H02831 Dermatochalasis of right upper eyelid: Secondary | ICD-10-CM | POA: Diagnosis not present

## 2024-04-09 DIAGNOSIS — H53483 Generalized contraction of visual field, bilateral: Secondary | ICD-10-CM | POA: Diagnosis not present

## 2024-04-09 DIAGNOSIS — Z01818 Encounter for other preprocedural examination: Secondary | ICD-10-CM | POA: Diagnosis not present

## 2024-04-09 DIAGNOSIS — H02412 Mechanical ptosis of left eyelid: Secondary | ICD-10-CM | POA: Diagnosis not present

## 2024-04-21 DIAGNOSIS — L309 Dermatitis, unspecified: Secondary | ICD-10-CM | POA: Diagnosis not present

## 2024-04-22 ENCOUNTER — Ambulatory Visit: Payer: Self-pay | Admitting: *Deleted

## 2024-04-22 NOTE — Telephone Encounter (Signed)
 Spoke with patient today.  Appointment made for Monday as she is going out of town this week.

## 2024-04-22 NOTE — Telephone Encounter (Signed)
 FYI Only or Action Required?: Action required by provider: referral request and clinical question for provider.  Patient was last seen in primary care on 09/05/2023 by [name], MD.  Called Nurse Triage reporting Allergic Reaction.  Symptoms began several months ago.  Interventions attempted: Prescription medications: kenalog injection , has not started prednisone .  Symptoms are: gradually worsening.  Triage Disposition: Go to ED Now (or PCP Triage)  Patient/caregiver understands and will follow disposition?: No, wishes to speak with PCP              Copied from CRM [id number]. Topic: Clinical - Red Word Triage >> Apr 22, 2024 10:00 AM Berneda FALCON wrote: Red Word that prompted transfer to Nurse Triage: Patient having allergic reaction, swelling in face and eyes, blurred vision, itching and severe burning  Believes this is a response to the heat off and on for about 8 weeks Reason for Disposition  Patient sounds very sick or weak to the triager  Answer Assessment - Initial Assessment Questions Recommended ED now. Patient reports she has been to UC and received kenalog injection and helped some. Has not started prednisone oral medication yet and is going to pick up today. Patient declined ED at this time. Patient requesting PCP to refer her to rheumatoid dr, pt father had hx of lupus. CAL notified patient declined ED.       1. ONSET: When did the swelling start? (e.g., minutes, hours, days)     8 weeks ago  2. LOCATION: What part of the face is swollen? (e.g., cheek, entire face, jaw joint area, under jaw)     Face , eyes  3. SEVERITY: How swollen is it?     Worsening  4. ITCHING: Is there any itching? If Yes, ask: How much?   (Scale 1-10; mild, moderate or severe)     Yes severe 5. PAIN: Is the swelling painful to touch? If Yes, ask: How painful is it?   (Scale 0-10; mild, moderate or severe)     Yes  6. FEVER: Do you have a fever? If Yes, ask:  What is it, how was it measured, and when did it start?      na 7. CAUSE: What do you think is causing the face swelling?     Allergic reaction but has gotten worse x 8 weeks  8. NEW MEDICINES: Have there been any new medicines started recently?     na 9. RECURRENT SYMPTOM: Have you had face swelling before? If Yes, ask: When was the last time? What happened that time?     Yes went to UC and got kenalog injection has not started prednisone yet 10. OTHER SYMPTOMS: Do you have any other symptoms? (e.g., leg swelling, toothache)       Chills, swelling face and eye lids, dizzy at times with standing when up gets ok. Got kenalog injection at [facility] yesterday blurred vision, skin peeling on face  11. PREGNANCY: Is there any chance you are pregnant? When was your last menstrual period?       na  Protocols used: Face Swelling-A-AH

## 2024-04-28 ENCOUNTER — Encounter: Payer: Self-pay | Admitting: Internal Medicine

## 2024-04-28 NOTE — Progress Notes (Deleted)
    Subjective:    Patient ID: Elizabeth Wilkins, female    DOB: [birth year], 78 y.o.   MRN: [id number]      HPI Elizabeth Wilkins is here for No chief complaint on file.        Medications and allergies reviewed with patient and updated if appropriate.  Current Outpatient Medications on File Prior to Visit  Medication Sig Dispense Refill   acetaminophen  (TYLENOL ) 500 MG tablet Take 1,000 mg by mouth as needed.     amoxicillin  (AMOXIL ) 500 MG capsule Take 4 tabs 1 hr prior to dental appt 4 capsule 0   chlorhexidine  (PERIDEX ) 0.12 % solution RINSE 1/2OZ TWICE A DAY AFTER BREAKFAST & AT BEDTIME FOR 1 MIN AFTER BRUSHING AND FLOSSING. SPIT OUT     clobetasol (TEMOVATE) 0.05 % external solution PLEASE SEE ATTACHED FOR DETAILED DIRECTIONS     linaclotide  (LINZESS ) 72 MCG capsule Take 1 capsule (72 mcg total) by mouth daily before breakfast. 90 capsule 3   PARoxetine  (PAXIL ) 10 MG tablet Take 1 tablet (10 mg total) by mouth daily.     No current facility-administered medications on file prior to visit.    Review of Systems     Objective:  There were no vitals filed for this visit. BP Readings from Last 3 Encounters:  11/22/23 (!) 130/90  09/15/23 122/70  09/05/23 (!) 140/74   Wt Readings from Last 3 Encounters:  04/03/24 168 lb (76.2 kg)  11/22/23 168 lb 8 oz (76.4 kg)  09/15/23 164 lb 4 oz (74.5 kg)   There is no height or weight on file to calculate BMI.    Physical Exam         Assessment & Plan:    See Problem List for Assessment and Plan of chronic medical problems.

## 2024-04-28 NOTE — Patient Instructions (Incomplete)
   Blood work was ordered.       Medications changes include :   decrease lasix to 20 mg daily and decrease potassium to 20 mg daily   A referral was ordered for the heart failure clinic.      Return in about 2 months (around 10/15/2023) for follow up.

## 2024-04-29 ENCOUNTER — Ambulatory Visit: Admitting: Internal Medicine

## 2024-05-03 ENCOUNTER — Ambulatory Visit

## 2024-05-07 DIAGNOSIS — F424 Excoriation (skin-picking) disorder: Secondary | ICD-10-CM | POA: Diagnosis not present

## 2024-05-07 DIAGNOSIS — L562 Photocontact dermatitis [berloque dermatitis]: Secondary | ICD-10-CM | POA: Diagnosis not present

## 2024-05-07 DIAGNOSIS — L309 Dermatitis, unspecified: Secondary | ICD-10-CM | POA: Diagnosis not present

## 2024-05-07 DIAGNOSIS — T887XXA Unspecified adverse effect of drug or medicament, initial encounter: Secondary | ICD-10-CM | POA: Diagnosis not present

## 2024-06-04 ENCOUNTER — Ambulatory Visit

## 2024-07-12 ENCOUNTER — Ambulatory Visit: Admitting: Family Medicine

## 2024-07-12 ENCOUNTER — Encounter (HOSPITAL_BASED_OUTPATIENT_CLINIC_OR_DEPARTMENT_OTHER): Payer: Self-pay | Admitting: Family

## 2024-07-12 ENCOUNTER — Ambulatory Visit (INDEPENDENT_AMBULATORY_CARE_PROVIDER_SITE_OTHER): Admitting: Family

## 2024-07-12 VITALS — BP 115/80 | HR 79 | Ht 70.0 in | Wt 170.0 lb

## 2024-07-12 DIAGNOSIS — I2510 Atherosclerotic heart disease of native coronary artery without angina pectoris: Secondary | ICD-10-CM | POA: Diagnosis not present

## 2024-07-12 DIAGNOSIS — I700 Atherosclerosis of aorta: Secondary | ICD-10-CM

## 2024-07-12 DIAGNOSIS — E7800 Pure hypercholesterolemia, unspecified: Secondary | ICD-10-CM

## 2024-07-12 DIAGNOSIS — R0602 Shortness of breath: Secondary | ICD-10-CM | POA: Diagnosis not present

## 2024-07-12 DIAGNOSIS — R5383 Other fatigue: Secondary | ICD-10-CM

## 2024-07-12 NOTE — Patient Instructions (Signed)
 Medication Instructions:   Your physician recommends that you continue on your current medications as directed. Please refer to the Current Medication list given to you today.   *If you need a refill on your cardiac medications before your next appointment, please call your pharmacy*  Lab Work:  TODAY!!!! CBC/THYROID /BMET  If you have labs (blood work) drawn today and your tests are completely normal, you will receive your results only by: MyChart Message (if you have MyChart) OR A paper copy in the mail If you have any lab test that is abnormal or we need to change your treatment, we will call you to review the results.  Testing/Procedures:  Your physician has requested that you have an echocardiogram. Echocardiography is a painless test that uses sound waves to create images of your heart. It provides your doctor with information about the size and shape of your heart and how well your heart's chambers and valves are working. This procedure takes approximately one hour. There are no restrictions for this procedure. Please do NOT wear cologne, perfume or lotions (deodorant is allowed). Please arrive 15 minutes prior to your appointment time.  Please note: We ask at that you not bring children with you during ultrasound (echo/ vascular) testing. Due to room size and safety concerns, children are not allowed in the ultrasound rooms during exams. Our front office staff cannot provide observation of children in our lobby area while testing is being conducted. An adult accompanying a patient to their appointment will only be allowed in the ultrasound room at the discretion of the ultrasound technician under special circumstances. We apologize for any inconvenience.   Follow-Up: At [facility], you and your health needs are our priority.  As part of our continuing mission to provide you with exceptional heart care, our providers are all part of one team.  This team includes your  primary Cardiologist (physician) and Advanced Practice Providers or APPs (Physician Assistants and Nurse Practitioners) who all work together to provide you with the care you need, when you need it.  Your next appointment:   3 month(s)  Provider:   Shelda Bruckner, MD, Rosaline Bane, NP, or Reche Finder, NP    We recommend signing up for the patient portal called MyChart.  Sign up information is provided on this After Visit Summary.  MyChart is used to connect with patients for Virtual Visits (Telemedicine).  Patients are able to view lab/test results, encounter notes, upcoming appointments, etc.  Non-urgent messages can be sent to your provider as well.   To learn more about what you can do with MyChart, go to [email or web address].   Other Instructions   To prevent lightheadedness: Stay well hydrated Eat regular meals Make position changes slowly

## 2024-07-12 NOTE — Progress Notes (Signed)
 Cardiology Office Note   Date:  07/12/2024  ID:  Elizabeth Wilkins, DOB 03-06-1946, MRN [id number] PCP: [name], MD  [facility] Providers Cardiologist:  Shelda Bruckner, MD     History of Present Illness Elizabeth Wilkins is a 78 y.o. female with hx of coronary calcification, LAFB/RBBB, HLD, HTN.  Prior CT scan with small focal spot of coronary calcification in the partially visualized portion of the heart as well as abdominal aortic calcification per Dr. Bruckner review.  At visit 11/22/2023 she was recommended for aspirin  and statin.  She politely declined statin as her husband had not tolerated and she did not wish to trial.  She did not wish to start aspirin  as she bruises easily.  She was recommended for heart healthy diet and regular cardiovascular exercise and encouraged to follow-up as needed.  Discussed the use of AI scribe software for clinical note transcription with the patient, who gave verbal consent to proceed.  History of Present Illness Elizabeth Wilkins is a 77 year old female who presents with fluctuating blood pressure and fatigue.  She experiences fluctuating blood pressure, first noted on Wednesday with a reading of 98/56 mmHg, accompanied by extreme fatigue.  Of note this was checked with a wrist cuff which has not been previously checked for accuracy.  She feels lightheaded, especially when needing to sit down, but does not experience dizziness or vertigo. There is no chest pain or dyspnea. She has a history of low blood pressure and previously used antihypertensive medication, which she no longer takes.  Over the past six months, she has experienced increased fatigue at various times of the day, including after dinner or in the morning. She has difficulty initiating sleep but maintains sleep once achieved, yet awakens feeling tired. There is no hematuria, melena, palpitations, or arrhythmias.  Her current medication is Linzess , taken for bowel  regulation. Her diet includes cereal with blueberries for breakfast, two meals a day, and an afternoon snack. She primarily consumes caffeinated coffee and occasionally [place] water , encouraged an increase in fluid intake.  ROS: Please see the history of present illness.    All other systems reviewed and are negative.   Studies Reviewed          Risk Assessment/Calculations           Physical Exam VS:  BP 115/80 (BP Location: Left Arm, Patient Position: Sitting, Cuff Size: Normal) Comment: 5 mins after taking right arm's BP  Pulse 79   Ht 5' 10 (1.778 m)   Wt 170 lb (77.1 kg)   SpO2 97%   BMI 24.39 kg/m        Wt Readings from Last 3 Encounters:  07/12/24 170 lb (77.1 kg)  04/03/24 168 lb (76.2 kg)  11/22/23 168 lb 8 oz (76.4 kg)    GEN: Well nourished, well developed in no acute distress NECK: No JVD; No carotid bruits CARDIAC: RRR, no murmurs, rubs, gallops RESPIRATORY:  Clear to auscultation without rales, wheezing or rhonchi  ABDOMEN: Soft, non-tender, non-distended EXTREMITIES:  No edema; No deformity   ASSESSMENT AND PLAN  Fatigue/lightheadedness- Reports ongoing fatigue for 6 mos. Reports episodes of lightheadedness with hypotension. Not on antihypertensive agent.  Plan for echocardiogram to rule out valvular abnormality Labs today CBC, thyroid , BMP to rule out anemia, thyroid  abnormality or electrolyte abnormalities contribute to labile blood pressure and fatigue. Encouraged increased hydration to at least 64 ounces per day, eat regular meals, make position changes slowly to prevent  lightheadedness. Concern fatigue could be related to inactivity.  Encouraged increase physical activity through participation in right start exercise program and schedule.  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 Encouraged to increase exercise, as above. Not on AV nodal blocking agent, would avoid. Given fatigue if above workup unremarkable could consider ZIO monitor.    Coronary artery calcification/HLD-no anginal symptoms.  EKG today no acute ST/T wave changes.  Previously declined statin and aspirin . Recommend aiming for 150 minutes of moderate intensity activity per week and following a heart healthy diet.         Dispo: follow up in 3 months with Dr. Lonni  Signed, Reche GORMAN Finder, NP

## 2024-07-13 ENCOUNTER — Encounter: Payer: Self-pay | Admitting: Internal Medicine

## 2024-07-13 DIAGNOSIS — M810 Age-related osteoporosis without current pathological fracture: Secondary | ICD-10-CM

## 2024-07-13 DIAGNOSIS — E2839 Other primary ovarian failure: Secondary | ICD-10-CM

## 2024-07-13 LAB — BASIC METABOLIC PANEL WITH GFR
BUN/Creatinine Ratio: 24 (ref 12–28)
BUN: 20 mg/dL (ref 8–27)
CO2: 25 mmol/L (ref 20–29)
Calcium: 9.8 mg/dL (ref 8.7–10.3)
Chloride: 104 mmol/L (ref 96–106)
Creatinine, Ser: 0.84 mg/dL (ref 0.57–1.00)
Glucose: 91 mg/dL (ref 70–99)
Potassium: 5.2 mmol/L (ref 3.5–5.2)
Sodium: 143 mmol/L (ref 134–144)
eGFR: 71 mL/min/1.73 (ref >59)

## 2024-07-13 LAB — THYROID PANEL WITH TSH
Free Thyroxine Index: 1.7 (ref 1.2–4.9)
T3 Uptake Ratio: 26 % (ref 24–39)
T4, Total: 6.5 ug/dL (ref 4.5–12.0)
TSH: 3.6 u[IU]/mL (ref 0.450–4.500)

## 2024-07-13 LAB — CBC
Hematocrit: 40.3 % (ref 34.0–46.6)
Hemoglobin: 13.3 g/dL (ref 11.1–15.9)
MCH: 31.5 pg (ref 26.6–33.0)
MCHC: 33 g/dL (ref 31.5–35.7)
MCV: 96 fL (ref 79–97)
Platelets: 286 x10E3/uL (ref 150–450)
RBC: 4.22 x10E6/uL (ref 3.77–5.28)
RDW: 12.5 % (ref 11.7–15.4)
WBC: 6.2 x10E3/uL (ref 3.4–10.8)

## 2024-07-15 ENCOUNTER — Ambulatory Visit (HOSPITAL_BASED_OUTPATIENT_CLINIC_OR_DEPARTMENT_OTHER): Payer: Self-pay | Admitting: Family

## 2024-07-15 DIAGNOSIS — I77819 Aortic ectasia, unspecified site: Secondary | ICD-10-CM

## 2024-07-15 DIAGNOSIS — I2510 Atherosclerotic heart disease of native coronary artery without angina pectoris: Secondary | ICD-10-CM

## 2024-07-15 DIAGNOSIS — I700 Atherosclerosis of aorta: Secondary | ICD-10-CM

## 2024-07-16 ENCOUNTER — Encounter: Payer: Self-pay | Admitting: Internal Medicine

## 2024-07-26 ENCOUNTER — Ambulatory Visit: Admitting: Internal Medicine

## 2024-07-29 ENCOUNTER — Ambulatory Visit (INDEPENDENT_AMBULATORY_CARE_PROVIDER_SITE_OTHER): Admitting: Internal Medicine

## 2024-07-29 VITALS — BP 136/80 | HR 87 | Temp 98.1°F | Ht 70.0 in | Wt 174.0 lb

## 2024-07-29 DIAGNOSIS — M810 Age-related osteoporosis without current pathological fracture: Secondary | ICD-10-CM | POA: Diagnosis not present

## 2024-07-29 DIAGNOSIS — N3946 Mixed incontinence: Secondary | ICD-10-CM | POA: Diagnosis not present

## 2024-07-29 MED ORDER — MIRABEGRON ER 25 MG PO TB24: 25.0000 mg | ORAL_TABLET | Freq: Every day | ORAL | 5 refills | Status: DC

## 2024-07-29 NOTE — Assessment & Plan Note (Addendum)
 Chronic Not exercising regularly-does some housework and yard work Continue taking calcium  and vitamin D , vitamin K Dexa due - ordered At this point does not think she will take any medication for osteoporosis, but is interested to see where she is at with the addition of other supplements

## 2024-07-29 NOTE — Progress Notes (Signed)
    Subjective:    Patient ID: Elizabeth Wilkins, female    DOB: [birth year], 78 y.o.   MRN: [id number]      HPI Elizabeth Wilkins is here for  Chief Complaint  Patient presents with   Urinary Incontinence    Mixed urinary incontinence - chronic. She has some leakage with cough/sneezing and has urge incontinence.  Nocturia 1-2 times.  She is drinking more water .  She has incontinence primarily during the night-cannot wake up.  She states she is fine during the day.  It is bothersome because she has to change the middle of the night.  She wondered about medication.     Medications and allergies reviewed with patient and updated if appropriate.  Current Outpatient Medications on File Prior to Visit  Medication Sig Dispense Refill   amoxicillin  (AMOXIL ) 500 MG capsule Take 4 tabs 1 hr prior to dental appt 4 capsule 0   CALCIUM  PO Take 1 capsule by mouth in the morning and at bedtime.     chlorhexidine  (PERIDEX ) 0.12 % solution RINSE 1/2OZ TWICE A DAY AFTER BREAKFAST & AT BEDTIME FOR 1 MIN AFTER BRUSHING AND FLOSSING. SPIT OUT     Cholecalciferol (VITAMIN D -3 PO) Take 1 capsule by mouth in the morning and at bedtime.     clobetasol (TEMOVATE) 0.05 % external solution PLEASE SEE ATTACHED FOR DETAILED DIRECTIONS     Coenzyme Q10 (COQ10 PO) Take 1 capsule by mouth daily.     linaclotide  (LINZESS ) 72 MCG capsule Take 1 capsule (72 mcg total) by mouth daily before breakfast. 90 capsule 3   Magnesium 400 MG CAPS Take 1 capsule by mouth at bedtime.     Menaquinone-7 (VITAMIN K2 PO) Take 1 capsule by mouth in the morning and at bedtime.     TURMERIC PO Take 1 tablet by mouth daily.     No current facility-administered medications on file prior to visit.    Review of Systems  Genitourinary:  Positive for frequency and urgency. Negative for difficulty urinating and dysuria.       Objective:   Vitals:   07/29/24 1302  BP: 136/80  Pulse: 87  Temp: 98.1 F (36.7 C)  SpO2: 98%   BP Readings from  Last 3 Encounters:  07/29/24 136/80  07/12/24 115/80  11/22/23 (!) 130/90   Wt Readings from Last 3 Encounters:  07/29/24 174 lb (78.9 kg)  07/12/24 170 lb (77.1 kg)  04/03/24 168 lb (76.2 kg)   Body mass index is 24.97 kg/m.    Physical Exam Constitutional:      General: She is not in acute distress.    Appearance: Normal appearance. She is not ill-appearing.  HENT:     Head: Normocephalic and atraumatic.  Skin:    General: Skin is warm and dry.  Neurological:     Mental Status: She is alert. Mental status is at baseline.  Psychiatric:        Mood and Affect: Mood normal.        Behavior: Behavior normal.        Thought Content: Thought content normal.        Judgment: Judgment normal.            Assessment & Plan:    See Problem List for Assessment and Plan of chronic medical problems.

## 2024-07-29 NOTE — Patient Instructions (Addendum)
    Return for Schedule DEXA-[facility]lam.    Medications changes include :   myrbetriq 25 mg daily     Urinary Incontinence: What to Know Urinary incontinence, or UI, happens if you can't always control when you pee. If you think you have UI, it's important to talk to your health care provider. There're things that can be done to help treat your problem. What are the different types of UI? There are many types of UI. You may have more than one type: Stress UI. You pee all of a sudden when doing activities such exercising. You may also pee when you cough or laugh. Urge UI. You have a sudden urge to pee and you begin peeing before you get to a bathroom. Overflow UI. You feel the need to pee often but only pee a small amount. You also always leak urine. Functional UI. You pee when you can't get to the bathroom in time due to: A physical problem like a bladder injury or arthritis. A disease that affects the way your brain works, like Alzheimer's disease. Nocturnal UI. You pee while you sleep. What are the causes?     Medicines. Infections. Trouble pooping (constipation). Bladder muscles that squeeze too much or too soon (overactive bladder). Weak bladder muscles. Weak pelvic floor muscles. These muscles provide support for the bladder, intestines, and the uterus. Enlarged prostate in males. The prostate sits around the tube that drains pee from the bladder (urethra). If the prostate gets big, it can pinch the urethra. If the urethra is blocked, the bladder gets weak and can't empty all the way. Surgery. Diseases that affect the nerves or spinal cord. What puts you at risk for UI? Age. The older you are, the higher the risk. Being overweight or having obesity. Being inactive and not getting much exercise. Pregnancy and childbirth. Menopause. Long-term coughing. Treatment for prostate cancer. How is UI diagnosed? A physical exam. Pee tests. X-rays of your kidneys or  bladder. Ultrasound or CT scan. Cystoscopy. In this procedure, a provider inserts a tube with a light and camera (cystoscope) through the urethra and into the bladder to check for problems. Urodynamic testing. These tests check how well the bladder can store and release pee. Your provider may ask you to write down your symptoms. You may be asked to write when you pee and how much you pee. Where to find more information [employer] of Diabetes and Digestive and Kidney Diseases: carflippers.tn American Urology Association: www.urologyhealth.org This information is not intended to replace advice given to you by your health care provider. Make sure you discuss any questions you have with your health care provider. Document Revised: 08/16/2023 Document Reviewed: 05/19/2023 Elsevier Patient Education  2024 [employer].

## 2024-07-29 NOTE — Assessment & Plan Note (Signed)
 Chronic Has had some incontinence for a while-has gotten worse States some mild stress incontinence and urge incontinence Typically she is fine during the day Has urge incontinence in the middle of the night-not able to make it to the bathroom, which is bothersome She does not wear a pad at night and would prefer not to.  She typically just changes her clothes Discussed possibly seeing a urologist or urogynecologist Discussed medications, pelvic physical therapy and other procedures that are used to treat urinary incontinence She would like to try medication and if that does not work consider seeing a specialist Start Myrbetriq 25 mg daily-discussed this could cause constipation, dry mouth and is much less likely to cause memory issues than the older medication Discussed that we can increase the dose if this is effective

## 2024-07-30 ENCOUNTER — Other Ambulatory Visit

## 2024-08-05 ENCOUNTER — Ambulatory Visit (INDEPENDENT_AMBULATORY_CARE_PROVIDER_SITE_OTHER)

## 2024-08-05 DIAGNOSIS — I700 Atherosclerosis of aorta: Secondary | ICD-10-CM | POA: Diagnosis not present

## 2024-08-05 DIAGNOSIS — I2510 Atherosclerotic heart disease of native coronary artery without angina pectoris: Secondary | ICD-10-CM

## 2024-08-05 DIAGNOSIS — R0602 Shortness of breath: Secondary | ICD-10-CM | POA: Diagnosis not present

## 2024-08-05 DIAGNOSIS — R5383 Other fatigue: Secondary | ICD-10-CM | POA: Diagnosis not present

## 2024-08-05 DIAGNOSIS — E7800 Pure hypercholesterolemia, unspecified: Secondary | ICD-10-CM | POA: Diagnosis not present

## 2024-08-05 LAB — ECHOCARDIOGRAM COMPLETE
Area-P 1/2: 3.6 cm2
S' Lateral: 3.65 cm

## 2024-08-06 ENCOUNTER — Ambulatory Visit
Admission: RE | Admit: 2024-08-06 | Discharge: 2024-08-06 | Disposition: A | Source: Ambulatory Visit | Attending: Internal Medicine

## 2024-08-06 DIAGNOSIS — M810 Age-related osteoporosis without current pathological fracture: Secondary | ICD-10-CM | POA: Diagnosis not present

## 2024-08-06 DIAGNOSIS — E2839 Other primary ovarian failure: Secondary | ICD-10-CM

## 2024-08-06 NOTE — Telephone Encounter (Signed)
 Last read by Dagoberto KANDICE Lodge at 8:01AM on 08/06/2024.   Results were sent to the pts active mychart to review by Reche Finder, NP.   Pt did view this in mychart.    Will endorse to the pt that we will place the one year echo order in the system and have our scheduling team reach out to her closer to that time to arrange this appt.     Will also inquire if she is having any sob.

## 2024-08-06 NOTE — Telephone Encounter (Signed)
-----   Message from Reche GORMAN Finder sent at 08/06/2024  8:00 AM EST ----- Echocardiogram with normal heart pumping function.  Moderately elevated lung pressures.  Mild leaking of the mitral valve.  Moderate leaking of the tricuspid valve.  Borderline dilation aorta 37 mm.  Repeat echo in 1 year for monitoring of aorta size.  No significant abnormalities that would cause low blood pressure.   The moderately elevated lung pressures could cause shortness of breath. Please inquire if she is having any shortness of breath. ----- Message ----- From: Interface, [employer] Sent: 08/05/2024   4:50 PM EST To: Reche GORMAN Finder, NP

## 2024-08-09 ENCOUNTER — Ambulatory Visit: Payer: Self-pay | Admitting: Internal Medicine

## 2024-08-09 DIAGNOSIS — M810 Age-related osteoporosis without current pathological fracture: Secondary | ICD-10-CM

## 2024-09-02 ENCOUNTER — Other Ambulatory Visit: Payer: Self-pay | Admitting: Internal Medicine

## 2024-09-02 ENCOUNTER — Ambulatory Visit: Payer: Self-pay

## 2024-09-02 ENCOUNTER — Telehealth: Payer: Self-pay

## 2024-09-02 MED ORDER — FLUCONAZOLE 150 MG PO TABS: 150.0000 mg | ORAL_TABLET | Freq: Once | ORAL | 0 refills | Status: DC

## 2024-09-02 MED ORDER — FLUCONAZOLE 150 MG PO TABS: 150.0000 mg | ORAL_TABLET | Freq: Once | ORAL | 0 refills | Status: AC

## 2024-09-02 NOTE — Telephone Encounter (Signed)
 FYI Only or Action Required?: Action required by provider: clinical question for provider and update on patient condition.  Patient was last seen in primary care on 07/29/2024 by [name], MD.  Called Nurse Triage reporting Vaginal Itching.  Symptoms began several days ago.  Interventions attempted: OTC medications: Vagisil and Other: warm bath.  Symptoms are: unchanged.  Triage Disposition: See HCP Within 4 Hours (Or PCP Triage)  Patient/caregiver understands and will follow disposition?: No, wishes to speak with PCP            Copied from CRM #[id number]. Topic: Clinical - Medication Question >> Sep 02, 2024  8:08 AM Elizabeth Wilkins wrote: Reason for CRM: Patient states she has a severe vaginal irritation. Tried otc vagisil and has not worked. Doesn't want to come into the office is requesting to have a medication prescribed to help.  Patient can be reached at [phone] Reason for Disposition  [1] SEVERE vaginal pain AND [2] not improved 2 hours after pain medicine  Answer Assessment - Initial Assessment Questions RN offered and advised acute visit to be seen, patient has not seen her PCP for this issue before. She states she lives far away and due to the snow today and black ice tomorrow she would prefer to send a message to her PCP instead.   1. SYMPTOM: What's the main symptom you're concerned about? (e.g., pain, itching, dryness)     Itching and irritation.  2. LOCATION: Where are the symptoms  located? (e.g., inside/outside, left/right)     Both sides, entire part and states even to her rectum.  3. ONSET: When did the  itching/irration  start?     Friday.  4. PAIN: Is there any pain? If Yes, ask: How bad is it? (Scale: 1-10; mild, moderate, severe)     Yes, irritation 10/10.  5. ITCHING: Is there any itching? If Yes, ask: How bad is it? (Scale: 1-10; mild, moderate, severe)     Yes, severe.  6. CAUSE: What do you think is causing the  discharge? Have you had the same problem before? What happened then?     Patient states she soaked in the tub in hot water  which causes irritation and dryness. She can usually treat with Cetaphil lotion but it didn't work this time. She states she also tried Vagisil which did not help.  7. OTHER SYMPTOMS: Do you have any other symptoms? (e.g., fever, itching, vaginal bleeding, pain with urination, injury to genital area, vaginal foreign body)     Denies vaginal discharge, bleeding, fever. She states she doesn't know if there are any bumps or blisters. She states when she urinates it burns the area.  Protocols used: Vaginal Symptoms-A-AH

## 2024-09-02 NOTE — Telephone Encounter (Signed)
 Copied from CRM [id number]. Topic: Clinical - Medication Question >> Sep 02, 2024  2:21 PM Elizabeth Wilkins wrote: Reason for CRM: Patient states that the medication she was prescribed today for her, she would like this to be in the form of a topical treatment rather than the tablet form. States she recalls now that she was taking this before and it caused some issue with the heart when taking tablets, but did not have the same impact when she was taking it topically and would like it changed over if at all possible please.  Medication: fluconazole  (DIFLUCAN ) 150 MG tablet  Pharmacy: [id number] - [place], [place] - [address] AT [address] [facility] [address] [place] [address] Phone: [phone] Fax: [phone] Hours: Not open 24 hours  Patient callback is: [phone]

## 2024-09-02 NOTE — Telephone Encounter (Signed)
 Will send a diflucan  to her pharmacy, but it may be dry skin related to menopause which is treated differently.  She should see gyn if symptoms do not improve or recur.

## 2024-09-02 NOTE — Telephone Encounter (Signed)
 Message left for patient.  Also sent her a my-chart message.

## 2024-09-03 MED ORDER — TERCONAZOLE 0.8 % VA CREA: 1.0000 | TOPICAL_CREAM | Freq: Every day | VAGINAL | 0 refills | Status: AC

## 2024-09-03 NOTE — Telephone Encounter (Signed)
Topical cream sent to pharmacy.

## 2024-09-03 NOTE — Addendum Note (Signed)
 Addended by: [name] on: 09/03/2024 12:08 PM   Modules accepted: Orders

## 2024-09-04 ENCOUNTER — Ambulatory Visit: Payer: Self-pay

## 2024-09-04 NOTE — Telephone Encounter (Signed)
 FYI Only or Action Required?: Action required by provider: request for appointment, clinical question for provider, and update on patient condition.  Patient was last seen in primary care on 07/29/2024 by [name], MD.  Called Nurse Triage reporting Groin Swelling.  Symptoms began several days ago.  Interventions attempted: Prescription medications: terconazole  (TERAZOL 3 ) 0.8 % vaginal cream [[id number]] and Rest, hydration, or home remedies.  Symptoms are: rapidly worsening.  Triage Disposition: See HCP Within 4 Hours (Or PCP Triage)  Patient/caregiver understands and will follow disposition?: No, wishes to speak with PCP               Copied from CRM #[id number]. Topic: Clinical - Red Word Triage >> Sep 04, 2024  9:03 AM Tiffini S wrote: [facility] that prompted transfer to Nurse Triage: pain and discomfort since Sunday- was prescribed medication that is not helping for her vaginal irritation and itching. Reason for Disposition  [1] SEVERE vaginal pain AND [2] not improved 2 hours after pain medicine  Answer Assessment - Initial Assessment Questions Patient states she used the prescribed topical medication last night but she has gotten worse Patient states a severe vaginal irriation --states it is actually in her groin area She states she has swelling and pain and it started over the weekend Patient states that she has not had a fever that she knows of, denies nausea/vomiting She states that she doesn't know if she is describing the area in her groin/vaginal area correctly but she doesn't think there is a cyst but states it is severely painful in that area--she also has burning and burning with urination  Patient tearful during triage and states she is shaking some but she thinks that may be from the pain and not fever. She is advised with severe pain like this it is advised she be seen and evaluated in the next few hours at Urgent Care or the Emergency Room Patient  states she doesn't want to go to the Emergency Room and is going to wait until she hears back from Dr Glade Geofm. Patient states she has not taken anything like Tylenol  or Ibuprofen for the pain but she wants to see a doctor.  Patient is advised to call us  back if anything changes or with any further questions/concerns. Patient is advised that if anything worsens to go to the Emergency Room. Patient verbalized understanding.  Protocols used: Vaginal Symptoms-A-AH

## 2024-09-05 ENCOUNTER — Ambulatory Visit (HOSPITAL_COMMUNITY)

## 2024-09-05 DIAGNOSIS — L900 Lichen sclerosus et atrophicus: Secondary | ICD-10-CM | POA: Diagnosis not present

## 2024-09-05 NOTE — Telephone Encounter (Signed)
 This has been taken care of already.

## 2024-10-04 ENCOUNTER — Ambulatory Visit (INDEPENDENT_AMBULATORY_CARE_PROVIDER_SITE_OTHER): Admitting: Family

## 2024-10-04 ENCOUNTER — Encounter (HOSPITAL_BASED_OUTPATIENT_CLINIC_OR_DEPARTMENT_OTHER): Payer: Self-pay | Admitting: Family

## 2024-10-04 VITALS — BP 142/80 | HR 58 | Ht 70.0 in | Wt 175.5 lb

## 2024-10-04 DIAGNOSIS — R0683 Snoring: Secondary | ICD-10-CM

## 2024-10-04 DIAGNOSIS — I2510 Atherosclerotic heart disease of native coronary artery without angina pectoris: Secondary | ICD-10-CM

## 2024-10-04 NOTE — Patient Instructions (Addendum)
 Medication Instructions:  Continue your current medications.   *If you need a refill on your cardiac medications before your next appointment, please call your pharmacy*  Testing/Procedures:       Follow-Up: At [facility], you and your health needs are our priority.  As part of our continuing mission to provide you with exceptional heart care, our providers are all part of one team.  This team includes your primary Cardiologist (physician) and Advanced Practice Providers or APPs (Physician Assistants and Nurse Practitioners) who all work together to provide you with the care you need, when you need it.  Your next appointment:   6 month(s)  Provider:   Shelda Bruckner, MD or Reche Finder, NP    We recommend signing up for the patient portal called MyChart.  Sign up information is provided on this After Visit Summary.  MyChart is used to connect with patients for Virtual Visits (Telemedicine).  Patients are able to view lab/test results, encounter notes, upcoming appointments, etc.  Non-urgent messages can be sent to your provider as well.   To learn more about what you can do with MyChart, go to [email or web address].   Other Instructions  To prevent lightheadedness: Aim for at least 64 oz of fluid Eat regular meals Wear knee high compression stockings during the day Add electrolytes

## 2024-10-04 NOTE — Progress Notes (Unsigned)
 " Cardiology Office Note   Date:  10/04/2024  ID:  Elizabeth Wilkins, DOB 1946/09/17, MRN [id number] PCP: [name], MD  [facility] Providers Cardiologist:  Shelda Bruckner, MD     History of Present Illness Elizabeth Wilkins is a 79 y.o. female with hx of coronary calcification, LAFB/RBBB, HLD, HTN.  Prior CT scan with small focal spot of coronary calcification in the partially visualized portion of the heart as well as abdominal aortic calcification per Dr. Bruckner review.  At visit 11/22/2023 she was recommended for aspirin  and statin.  She politely declined statin as her husband had not tolerated and she did not wish to trial.  She did not wish to start aspirin  as she bruises easily.  She was recommended for heart healthy diet and regular cardiovascular exercise and encouraged to follow-up as needed.  Discussed the use of AI scribe software for clinical note transcription with the patient, who gave verbal consent to proceed.  History of Present Illness Elizabeth Wilkins is a 79 year old female who presents with fluctuating blood pressure and fatigue.  She experiences fluctuating blood pressure, first noted on Wednesday with a reading of 98/56 mmHg, accompanied by extreme fatigue.  Of note this was checked with a wrist cuff which has not been previously checked for accuracy.  She feels lightheaded, especially when needing to sit down, but does not experience dizziness or vertigo. There is no chest pain or dyspnea. She has a history of low blood pressure and previously used antihypertensive medication, which she no longer takes.  Over the past six months, she has experienced increased fatigue at various times of the day, including after dinner or in the morning. She has difficulty initiating sleep but maintains sleep once achieved, yet awakens feeling tired. There is no hematuria, melena, palpitations, or arrhythmias.  Her current medication is Linzess , taken for bowel regulation.  Her diet includes cereal with blueberries for breakfast, two meals a day, and an afternoon snack. She primarily consumes caffeinated coffee and occasionally [place] water , encouraged an increase in fluid intake.***  Echo 08/05/24 normal LVEF ***, moderately elevated PASP, moderate TR, mild MR, borderline dilation ascending aorta 37mm. Repeat echo in 1 year ordered.   128/75    ROS: Please see the history of present illness.    All other systems reviewed and are negative.   Studies Reviewed      ***   Risk Assessment/Calculations   The patient's 1st BP is elevated (>139/89)*** Repeat BP and {Click to enter a 2nd BP Refresh Note  :1}       Physical Exam VS:  BP (!) 142/80 (BP Location: Left Arm, Patient Position: Sitting, Cuff Size: Normal)   Pulse (!) 58   Ht 5' 10 (1.778 m)   Wt 175 lb 8 oz (79.6 kg)   SpO2 99%   BMI 25.18 kg/m        Wt Readings from Last 3 Encounters:  10/04/24 175 lb 8 oz (79.6 kg)  07/29/24 174 lb (78.9 kg)  07/12/24 170 lb (77.1 kg)    GEN: Well nourished, well developed in no acute distress NECK: No JVD; No carotid bruits CARDIAC: RRR, no murmurs, rubs, gallops RESPIRATORY:  Clear to auscultation without rales, wheezing or rhonchi  ABDOMEN: Soft, non-tender, non-distended EXTREMITIES:  No edema; No deformity   ASSESSMENT AND PLAN  Fatigue/lightheadedness- Reports ongoing fatigue for 6 mos. Reports episodes of lightheadedness with hypotension. Not on antihypertensive agent.  Plan for echocardiogram to  rule out valvular abnormality Labs today CBC, thyroid , BMP to rule out anemia, thyroid  abnormality or electrolyte abnormalities contribute to labile blood pressure and fatigue. Encouraged increased hydration to at least 64 ounces per day, eat regular meals, make position changes slowly to prevent lightheadedness. Concern fatigue could be related to inactivity.  Encouraged increase physical activity through participation in right start exercise program  and schedule.  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 Encouraged to increase exercise, as above. Not on AV nodal blocking agent, would avoid. Given fatigue if above workup unremarkable could consider ZIO monitor.   Coronary artery calcification/HLD-no anginal symptoms.  EKG today no acute ST/T wave changes.  Previously declined statin and aspirin . Recommend aiming for 150 minutes of moderate intensity activity per week and following a heart healthy diet.         Dispo: follow up in 3 months with Dr. Lonni  Signed, Reche GORMAN Finder, NP   "

## 2024-10-06 ENCOUNTER — Encounter (HOSPITAL_BASED_OUTPATIENT_CLINIC_OR_DEPARTMENT_OTHER): Payer: Self-pay | Admitting: Family
